# Patient Record
Sex: Female | Born: 1965 | Race: Black or African American | Hispanic: No | Marital: Single | Smoking: Former smoker
Health system: Southern US, Community
[De-identification: ages and names within clinical notes are randomized; demographics above are authoritative.]

## PROBLEM LIST (undated history)

## (undated) DIAGNOSIS — M3130 Wegener's granulomatosis without renal involvement: Secondary | ICD-10-CM

## (undated) DIAGNOSIS — I10 Essential (primary) hypertension: Secondary | ICD-10-CM

## (undated) DIAGNOSIS — R51 Headache: Secondary | ICD-10-CM

## (undated) DIAGNOSIS — M2550 Pain in unspecified joint: Secondary | ICD-10-CM

## (undated) DIAGNOSIS — R040 Epistaxis: Secondary | ICD-10-CM

## (undated) DIAGNOSIS — M79673 Pain in unspecified foot: Secondary | ICD-10-CM

## (undated) DIAGNOSIS — R21 Rash and other nonspecific skin eruption: Secondary | ICD-10-CM

## (undated) DIAGNOSIS — G8929 Other chronic pain: Secondary | ICD-10-CM

## (undated) DIAGNOSIS — G587 Mononeuritis multiplex: Secondary | ICD-10-CM

## (undated) DIAGNOSIS — R519 Headache, unspecified: Secondary | ICD-10-CM

## (undated) DIAGNOSIS — F419 Anxiety disorder, unspecified: Secondary | ICD-10-CM

## (undated) DIAGNOSIS — E039 Hypothyroidism, unspecified: Secondary | ICD-10-CM

## (undated) DIAGNOSIS — K559 Vascular disorder of intestine, unspecified: Secondary | ICD-10-CM

## (undated) DIAGNOSIS — R109 Unspecified abdominal pain: Secondary | ICD-10-CM

## (undated) DIAGNOSIS — G609 Hereditary and idiopathic neuropathy, unspecified: Secondary | ICD-10-CM

## (undated) DIAGNOSIS — IMO0002 Reserved for concepts with insufficient information to code with codable children: Secondary | ICD-10-CM

## (undated) HISTORY — DX: Vascular disorder of intestine, unspecified: K55.9

## (undated) HISTORY — DX: Epistaxis: R04.0

## (undated) HISTORY — DX: Rash and other nonspecific skin eruption: R21

## (undated) HISTORY — DX: Essential (primary) hypertension: I10

## (undated) HISTORY — DX: Hereditary and idiopathic neuropathy, unspecified: G60.9

## (undated) HISTORY — DX: Pain in unspecified joint: M25.50

## (undated) HISTORY — DX: Wegener's granulomatosis without renal involvement: M31.30

## (undated) HISTORY — DX: Anxiety disorder, unspecified: F41.9

## (undated) HISTORY — DX: Reserved for concepts with insufficient information to code with codable children: IMO0002

## (undated) HISTORY — DX: Hypothyroidism, unspecified: E03.9

## (undated) HISTORY — DX: Mononeuritis multiplex: G58.7

---

## 1990-02-25 HISTORY — PX: TUBAL LIGATION: SHX77

## 1999-11-15 ENCOUNTER — Emergency Department (HOSPITAL_COMMUNITY): Admission: EM | Admit: 1999-11-15 | Discharge: 1999-11-15 | Payer: Self-pay | Admitting: Emergency Medicine

## 2000-09-30 ENCOUNTER — Other Ambulatory Visit: Admission: RE | Admit: 2000-09-30 | Discharge: 2000-09-30 | Payer: Self-pay | Admitting: *Deleted

## 2001-04-02 ENCOUNTER — Encounter: Payer: Self-pay | Admitting: Emergency Medicine

## 2001-04-02 ENCOUNTER — Emergency Department (HOSPITAL_COMMUNITY): Admission: EM | Admit: 2001-04-02 | Discharge: 2001-04-02 | Payer: Self-pay | Admitting: Emergency Medicine

## 2002-06-14 ENCOUNTER — Emergency Department (HOSPITAL_COMMUNITY): Admission: EM | Admit: 2002-06-14 | Discharge: 2002-06-15 | Payer: Self-pay

## 2002-10-07 ENCOUNTER — Other Ambulatory Visit: Admission: RE | Admit: 2002-10-07 | Discharge: 2002-10-07 | Payer: Self-pay | Admitting: Gynecology

## 2002-12-11 ENCOUNTER — Encounter: Payer: Self-pay | Admitting: Emergency Medicine

## 2002-12-11 ENCOUNTER — Emergency Department (HOSPITAL_COMMUNITY): Admission: EM | Admit: 2002-12-11 | Discharge: 2002-12-11 | Payer: Self-pay | Admitting: Emergency Medicine

## 2003-04-16 ENCOUNTER — Emergency Department (HOSPITAL_COMMUNITY): Admission: EM | Admit: 2003-04-16 | Discharge: 2003-04-16 | Payer: Self-pay | Admitting: Emergency Medicine

## 2003-07-08 ENCOUNTER — Emergency Department (HOSPITAL_COMMUNITY): Admission: EM | Admit: 2003-07-08 | Discharge: 2003-07-09 | Payer: Self-pay | Admitting: Emergency Medicine

## 2003-10-29 ENCOUNTER — Emergency Department (HOSPITAL_COMMUNITY): Admission: EM | Admit: 2003-10-29 | Discharge: 2003-10-29 | Payer: Self-pay | Admitting: Emergency Medicine

## 2003-11-04 ENCOUNTER — Emergency Department (HOSPITAL_COMMUNITY): Admission: EM | Admit: 2003-11-04 | Discharge: 2003-11-04 | Payer: Self-pay | Admitting: Emergency Medicine

## 2003-11-09 ENCOUNTER — Emergency Department (HOSPITAL_COMMUNITY): Admission: EM | Admit: 2003-11-09 | Discharge: 2003-11-09 | Payer: Self-pay | Admitting: Emergency Medicine

## 2003-11-19 ENCOUNTER — Emergency Department (HOSPITAL_COMMUNITY): Admission: EM | Admit: 2003-11-19 | Discharge: 2003-11-19 | Payer: Self-pay | Admitting: Emergency Medicine

## 2003-11-21 ENCOUNTER — Encounter: Admission: RE | Admit: 2003-11-21 | Discharge: 2003-11-21 | Payer: Self-pay | Admitting: Otolaryngology

## 2003-11-22 ENCOUNTER — Encounter (INDEPENDENT_AMBULATORY_CARE_PROVIDER_SITE_OTHER): Payer: Self-pay | Admitting: Specialist

## 2003-11-22 ENCOUNTER — Ambulatory Visit (HOSPITAL_COMMUNITY): Admission: RE | Admit: 2003-11-22 | Discharge: 2003-11-22 | Payer: Self-pay | Admitting: Otolaryngology

## 2003-11-22 ENCOUNTER — Ambulatory Visit (HOSPITAL_BASED_OUTPATIENT_CLINIC_OR_DEPARTMENT_OTHER): Admission: RE | Admit: 2003-11-22 | Discharge: 2003-11-22 | Payer: Self-pay | Admitting: Otolaryngology

## 2003-12-02 ENCOUNTER — Emergency Department (HOSPITAL_COMMUNITY): Admission: EM | Admit: 2003-12-02 | Discharge: 2003-12-02 | Payer: Self-pay

## 2003-12-09 ENCOUNTER — Emergency Department (HOSPITAL_COMMUNITY): Admission: EM | Admit: 2003-12-09 | Discharge: 2003-12-09 | Payer: Self-pay | Admitting: Emergency Medicine

## 2004-02-24 ENCOUNTER — Emergency Department (HOSPITAL_COMMUNITY): Admission: EM | Admit: 2004-02-24 | Discharge: 2004-02-24 | Payer: Self-pay | Admitting: Emergency Medicine

## 2004-02-27 ENCOUNTER — Emergency Department (HOSPITAL_COMMUNITY): Admission: EM | Admit: 2004-02-27 | Discharge: 2004-02-27 | Payer: Self-pay | Admitting: Emergency Medicine

## 2004-05-12 ENCOUNTER — Emergency Department (HOSPITAL_COMMUNITY): Admission: EM | Admit: 2004-05-12 | Discharge: 2004-05-13 | Payer: Self-pay | Admitting: Emergency Medicine

## 2004-10-13 ENCOUNTER — Emergency Department (HOSPITAL_COMMUNITY): Admission: EM | Admit: 2004-10-13 | Discharge: 2004-10-13 | Payer: Self-pay

## 2004-12-26 ENCOUNTER — Emergency Department (HOSPITAL_COMMUNITY): Admission: EM | Admit: 2004-12-26 | Discharge: 2004-12-26 | Payer: Self-pay | Admitting: Emergency Medicine

## 2005-02-28 ENCOUNTER — Other Ambulatory Visit: Admission: RE | Admit: 2005-02-28 | Discharge: 2005-02-28 | Payer: Self-pay | Admitting: Gynecology

## 2009-12-17 ENCOUNTER — Emergency Department (HOSPITAL_COMMUNITY): Admission: EM | Admit: 2009-12-17 | Discharge: 2009-12-17 | Payer: Self-pay | Admitting: Emergency Medicine

## 2010-02-01 ENCOUNTER — Emergency Department (HOSPITAL_COMMUNITY): Admission: EM | Admit: 2010-02-01 | Discharge: 2010-01-18 | Payer: Self-pay | Admitting: Emergency Medicine

## 2010-02-06 ENCOUNTER — Emergency Department (HOSPITAL_COMMUNITY)
Admission: EM | Admit: 2010-02-06 | Discharge: 2010-02-06 | Payer: Self-pay | Source: Home / Self Care | Admitting: Emergency Medicine

## 2010-02-07 ENCOUNTER — Inpatient Hospital Stay (HOSPITAL_COMMUNITY)
Admission: EM | Admit: 2010-02-07 | Discharge: 2010-02-12 | Payer: Self-pay | Source: Home / Self Care | Attending: Internal Medicine | Admitting: Internal Medicine

## 2010-02-08 ENCOUNTER — Encounter (INDEPENDENT_AMBULATORY_CARE_PROVIDER_SITE_OTHER): Payer: Self-pay | Admitting: Internal Medicine

## 2010-02-27 ENCOUNTER — Ambulatory Visit: Payer: Self-pay | Admitting: Hematology and Oncology

## 2010-03-01 ENCOUNTER — Encounter
Admission: RE | Admit: 2010-03-01 | Discharge: 2010-03-01 | Payer: Self-pay | Source: Home / Self Care | Attending: Gastroenterology | Admitting: Gastroenterology

## 2010-03-07 LAB — CBC & DIFF AND RETIC
BASO%: 0.1 % (ref 0.0–2.0)
Basophils Absolute: 0 10*3/uL (ref 0.0–0.1)
EOS%: 0.2 % (ref 0.0–7.0)
Eosinophils Absolute: 0 10*3/uL (ref 0.0–0.5)
HCT: 34.3 % — ABNORMAL LOW (ref 34.8–46.6)
HGB: 11.2 g/dL — ABNORMAL LOW (ref 11.6–15.9)
Immature Retic Fract: 17.2 % — ABNORMAL HIGH (ref 0.00–10.70)
LYMPH%: 10 % — ABNORMAL LOW (ref 14.0–49.7)
MCH: 25.2 pg (ref 25.1–34.0)
MCHC: 32.7 g/dL (ref 31.5–36.0)
MCV: 77.1 fL — ABNORMAL LOW (ref 79.5–101.0)
MONO#: 0.1 10*3/uL (ref 0.1–0.9)
MONO%: 1.4 % (ref 0.0–14.0)
NEUT#: 8.3 10*3/uL — ABNORMAL HIGH (ref 1.5–6.5)
NEUT%: 88.3 % — ABNORMAL HIGH (ref 38.4–76.8)
Platelets: 403 10*3/uL — ABNORMAL HIGH (ref 145–400)
RBC: 4.45 10*6/uL (ref 3.70–5.45)
RDW: 16.3 % — ABNORMAL HIGH (ref 11.2–14.5)
Retic %: 0.73 % (ref 0.50–1.50)
Retic Ct Abs: 32.49 10*3/uL (ref 18.30–72.70)
WBC: 9.4 10*3/uL (ref 3.9–10.3)
lymph#: 0.9 10*3/uL (ref 0.9–3.3)

## 2010-03-07 LAB — URINALYSIS, MICROSCOPIC - CHCC
Bilirubin (Urine): NEGATIVE
Glucose: NEGATIVE g/dL
Ketones: NEGATIVE mg/dL
Nitrite: NEGATIVE
Protein: 100 mg/dL
Specific Gravity, Urine: 1.025 (ref 1.003–1.035)
pH: 6.5 (ref 4.6–8.0)

## 2010-03-07 LAB — MORPHOLOGY: PLT EST: INCREASED

## 2010-03-12 LAB — COMPREHENSIVE METABOLIC PANEL
ALT: 12 U/L (ref 0–35)
AST: 17 U/L (ref 0–37)
Albumin: 4.5 g/dL (ref 3.5–5.2)
Alkaline Phosphatase: 70 U/L (ref 39–117)
BUN: 8 mg/dL (ref 6–23)
CO2: 20 mEq/L (ref 19–32)
Calcium: 10.5 mg/dL (ref 8.4–10.5)
Chloride: 102 mEq/L (ref 96–112)
Creatinine, Ser: 0.67 mg/dL (ref 0.40–1.20)
Glucose, Bld: 126 mg/dL — ABNORMAL HIGH (ref 70–99)
Potassium: 4.5 mEq/L (ref 3.5–5.3)
Sodium: 137 mEq/L (ref 135–145)
Total Bilirubin: 0.3 mg/dL (ref 0.3–1.2)
Total Protein: 7.7 g/dL (ref 6.0–8.3)

## 2010-03-12 LAB — HYPERCOAGULABLE PANEL, COMPREHENSIVE
AntiThromb III Func: 117 % (ref 76–126)
Anticardiolipin IgA: 1 APL U/mL (ref <22)
Anticardiolipin IgG: 3 GPL U/mL (ref <23)
Anticardiolipin IgM: 2 MPL U/mL (ref <11)
Beta-2 Glyco I IgG: 0 G Units (ref <20)
Beta-2-Glycoprotein I IgA: 1 A Units (ref <20)
Beta-2-Glycoprotein I IgM: 7 M Units (ref <20)
DRVVT: 38.7 secs (ref 36.2–44.3)
Lupus Anticoagulant: NOT DETECTED
PTT Lupus Anticoagulant: 34.2 secs (ref 30.0–45.6)
Protein C Activity: 194 % — ABNORMAL HIGH (ref 75–133)
Protein C, Total: >150 % — ABNORMAL HIGH (ref 70–140)
Protein S Activity: 83 % (ref 69–129)
Protein S Ag, Total: 128 % (ref 70–140)

## 2010-03-12 LAB — HEMOGLOBINOPATHY EVALUATION
Hemoglobin Other: 0 % (ref 0.0–0.0)
Hgb A2 Quant: 2.4 % (ref 2.2–3.2)
Hgb A: 97.6 % (ref 96.8–97.8)
Hgb F Quant: 0 % (ref 0.0–2.0)
Hgb S Quant: 0 % (ref 0.0–0.0)

## 2010-03-12 LAB — IRON AND TIBC
%SAT: 8 % — ABNORMAL LOW (ref 20–55)
Iron: 25 ug/dL — ABNORMAL LOW (ref 42–145)
TIBC: 317 ug/dL (ref 250–470)
UIBC: 292 ug/dL

## 2010-03-12 LAB — LACTATE DEHYDROGENASE: LDH: 192 U/L (ref 94–250)

## 2010-03-12 LAB — FERRITIN: Ferritin: 50 ng/mL (ref 10–291)

## 2010-03-12 LAB — VITAMIN B12: Vitamin B-12: 557 pg/mL (ref 211–911)

## 2010-03-16 NOTE — H&P (Signed)
NAMELUCELY, [name] NO.:  [id number]  MEDICAL RECORD NO.:  [id number]          PATIENT TYPE:  EMS  LOCATION:  MINO                         FACILITY:  [facility]  PHYSICIAN:  Vania Rea, M.D. DATE OF BIRTH:  April 17, 1965  DATE OF ADMISSION:  02/07/2010 DATE OF DISCHARGE:                             HISTORY & PHYSICAL   PRIMARY CARE PHYSICIAN:  Unassigned.  Patient is being admitted to [facility].  CHIEF COMPLAINT:  Abdominal pain.  HISTORY OF PRESENT ILLNESS:  Laura [name] is a very pleasant 45 year old female with a history of hypertension and Wegener's syndrome who presents to the [facility] ED with a chief complaint of abdominal pain.  Information is obtained from the patient.  She states that she was treated approximately 4 weeks ago when she had the same pain for enteritis with p.o. Cipro and Flagyl.  She states that she did well while she was on the medication and she did complete both medications.  Approximately 2 days after completing, she says that the pain in her abdomen reoccurred.  She denies any vomiting.  She is nauseous and unable to eat.  She denies any diarrhea, constipation.  She states that she had a BM yesterday and is able to pass gas.  She states the pain is located in the upper quadrants, particularly on the right side.  She describes it as sharp and radiating to the right side and back.  She states that movement makes it worse as well as palpation.  She indicates that she came to the [facility] Emergency Department 2 days ago and was given Zantac and pain medications.  However, the pain has persisted.  She rates the pain a 7/10 at its worst and characterizes it as severe.  She denies any vomiting, diarrhea, constipation, melena, dysuria, or hematuria.  Workup in the ED yields a CT of her abdomen and pelvis with mucosal edema of the right colon consistent with colitis. We are asked to admit for further evaluation and  treatment.  ALLERGIES:  No known drug allergies.  PAST MEDICAL HISTORY: 1. Hypertension. 2. Wegener's syndrome. 3. Questionable remote Graves disease.  PAST SURGICAL HISTORY:  Tubal ligation.  FAMILY MEDICAL HISTORY:  Mother is alive.  She is 62.  She has diabetes, hypertension and breast cancer.  Father is deceased at 52 from an aneurysm.  She has 2 siblings, one of whom has diabetes.  She has a grandmother who died from a stroke.  SOCIAL HISTORY:  She lives with her family.  She is employed as a Pharmacologist.  She is new to the area.  She just moved here from [place].  She denies tobacco use.  Denies EtOH.  Denies drug use.  MEDICATIONS:  Lisinopril dose unknown, Folic acid dose unknown, prednisone dose unknown, methotrexate dose unknown, Ambien dose unknown, Xanax dose unknown.  Pharmacy to please reconcile medications.  REVIEW OF SYSTEMS:  GENERAL:  Negative for fever, chills, anorexia, unintentional weight loss.  ENT:  Negative for ear pain, nasal congestion, sore throat.  CV:  Negative for chest pain, palpitations, lower extremity edema.  RESPIRATORY:  Negative for increased work of breathing or  cough.  MUSCULOSKELETAL:  Negative for joint pain or muscle weakness.  NEUROLOGIC:  Negative for headache, visual disturbances, numbness, tingling of extremities.  GI:  See HPI.  GU:  Negative for dysuria, hematuria, frequency or urgency.  PSYCHIATRIC:  Positive for some anxiety.  HEME:  Negative for any unusual bruising or bleeding.  LABS:  WBC 9.3, hemoglobin 11.4, hematocrit 36.5, platelets 425.  Sodium 133, potassium 4.3, chloride 100, CO2 28, BUN 7, creatinine 0.74, glucose 101, albumin 2.8.  Urinalysis:  30 protein, small blood, rare bacteria, 0 to 2 RBCs, 3 to 6 WBCs.  A urine pregnancy test is negative.  RADIOLOGY:  Chest x-ray yields areas of scarring/chronic changes.  No active disease.  CT of abdomen/pelvis:  Mucosal edema of the right colon consistent with  colitis with primary consideration being inflammatory or infectious etiology.  No abscess.  The terminal ileum is unremarkable.  PHYSICAL EXAMINATION:  VITAL SIGNS:  Temperature 98.8, BP 131/86, HR 108, respirations 18, saturation 100% on room air. GENERAL:  Awake, alert, well-nourished, well-hydrated, in no acute distress. HEENT:  Head normocephalic, atraumatic.  Pupils equal, round, reactive to light.  EOMI.  Mucous membranes of her mouth are moist and pink.  No obvious lesion or exudate in her nose or ears. NECK:  Supple.  No JVD.  Full range of motion.  No lymphadenopathy. CV:  Tachycardic but regular.  No murmur, gallop or rub.  No lower extremity edema.  Pedal pulses present and palpable.  RESPIRATORY:  No increased work of breathing.  Breath sounds clear to auscultation bilaterally.  No rhonchi, wheezes or rales. ABDOMEN:  Round, soft, very sluggish bowel sounds, particularly in the upper quadrants and on the right, tender to palpation particularly on the right.  No mass or organomegaly noted. NEUROLOGIC:  Alert and oriented x3.  Speech clear.  Facial symmetry. MUSCULOSKELETAL:  Moves all extremities.  No joint swelling/erythema. Nontender to palpation. EXTREMITIES: Without clubbing or cyanosis.  ASSESSMENT/PLAN: 1. Abdominal pain.  CT consistent with colitis.  Will admit.  We will     start Zosyn as the patient has already failed Cipro and Flagyl as     an outpatient.  Will provide pain medication and antiemetic as     needed.  Will give clear liquids and request GI consult. 2. Tachycardia probably secondary to pain and/or dehydration.  Will     provide pain medication and gently hydrate with IV fluids and     monitor on telemetry for 24 hours.  Will get an EKG. 3. Hypertension.  Blood pressure is currently 131/86.  Will continue     her lisinopril once medication reconciliation form complete. 4. History of Wegener's disease.  She is on 3 medications.  Will     continue once  medication reconciliation form complete. 5. Deep venous thrombosis prophylaxis.  Will use Lovenox.  CODE STATUS:  The patient is a full code.  This assessment and plan was discussed with Dr. Orvan Falconer.  It was truly a pleasure taking care of Laura [name].     Gwenyth Bender, NP   ______________________________ Vania Rea, M.D.    KMB/MEDQ  D:  02/07/2010  T:  02/07/2010  Job:  [id number]  Electronically Signed by Vania Rea M.D. on 03/10/2010 01:55:18 PM Electronically Signed by Toya Smothers  on 03/16/2010 08:25:27 AM

## 2010-03-18 ENCOUNTER — Encounter: Payer: Self-pay | Admitting: Gastroenterology

## 2010-03-27 ENCOUNTER — Other Ambulatory Visit: Payer: Self-pay | Admitting: Nephrology

## 2010-03-27 DIAGNOSIS — R809 Proteinuria, unspecified: Secondary | ICD-10-CM

## 2010-03-28 ENCOUNTER — Encounter: Payer: Self-pay | Admitting: Gastroenterology

## 2010-04-03 ENCOUNTER — Encounter (HOSPITAL_COMMUNITY): Payer: Managed Care, Other (non HMO) | Attending: Rheumatology

## 2010-04-03 DIAGNOSIS — M3130 Wegener's granulomatosis without renal involvement: Secondary | ICD-10-CM | POA: Insufficient documentation

## 2010-04-04 ENCOUNTER — Ambulatory Visit
Admission: RE | Admit: 2010-04-04 | Discharge: 2010-04-04 | Disposition: A | Discharge status: Home / Self Care | Payer: Managed Care, Other (non HMO) | Source: Ambulatory Visit | Attending: Nephrology | Admitting: Nephrology

## 2010-04-04 DIAGNOSIS — R809 Proteinuria, unspecified: Secondary | ICD-10-CM

## 2010-04-10 ENCOUNTER — Encounter (HOSPITAL_COMMUNITY): Payer: Managed Care, Other (non HMO)

## 2010-04-17 ENCOUNTER — Encounter (HOSPITAL_COMMUNITY): Payer: Managed Care, Other (non HMO)

## 2010-04-24 ENCOUNTER — Encounter (HOSPITAL_COMMUNITY): Payer: Managed Care, Other (non HMO)

## 2010-05-07 LAB — URINE MICROSCOPIC-ADD ON

## 2010-05-07 LAB — CBC
HCT: 30.3 % — ABNORMAL LOW (ref 36.0–46.0)
Hemoglobin: 10.6 g/dL — ABNORMAL LOW (ref 12.0–15.0)
Hemoglobin: 9.5 g/dL — ABNORMAL LOW (ref 12.0–15.0)
Hemoglobin: 11.2 g/dL — ABNORMAL LOW (ref 12.0–15.0)
MCH: 24.5 pg — ABNORMAL LOW (ref 26.0–34.0)
MCHC: 31.2 g/dL (ref 30.0–36.0)
MCV: 79.2 fL (ref 78.0–100.0)
Platelets: 389 10*3/uL (ref 150–400)
Platelets: 425 10*3/uL — ABNORMAL HIGH (ref 150–400)
Platelets: 488 10*3/uL — ABNORMAL HIGH (ref 150–400)
RBC: 3.84 MIL/uL — ABNORMAL LOW (ref 3.87–5.11)
RBC: 4.26 MIL/uL (ref 3.87–5.11)
RBC: 4.51 MIL/uL (ref 3.87–5.11)
RDW: 14.9 % (ref 11.5–15.5)
WBC: 6.1 10*3/uL (ref 4.0–10.5)
WBC: 6.9 10*3/uL (ref 4.0–10.5)
WBC: 9.5 10*3/uL (ref 4.0–10.5)

## 2010-05-07 LAB — DIFFERENTIAL
Basophils Absolute: 0 10*3/uL (ref 0.0–0.1)
Basophils Relative: 0 % (ref 0–1)
Eosinophils Relative: 8 % — ABNORMAL HIGH (ref 0–5)
Eosinophils Relative: 8 % — ABNORMAL HIGH (ref 0–5)
Lymphocytes Relative: 15 % (ref 12–46)
Lymphs Abs: 1.4 10*3/uL (ref 0.7–4.0)
Monocytes Absolute: 0.3 10*3/uL (ref 0.1–1.0)
Monocytes Absolute: 0.5 10*3/uL (ref 0.1–1.0)
Neutro Abs: 6.9 10*3/uL (ref 1.7–7.7)

## 2010-05-07 LAB — BASIC METABOLIC PANEL
BUN: 2 mg/dL — ABNORMAL LOW (ref 6–23)
Calcium: 8.6 mg/dL (ref 8.4–10.5)
GFR calc Af Amer: >60 mL/min (ref >60)
GFR calc Af Amer: >60 mL/min (ref >60)
GFR calc non Af Amer: >60 mL/min (ref >60)
GFR calc non Af Amer: >60 mL/min (ref >60)
Potassium: 3.5 mEq/L (ref 3.5–5.1)
Potassium: 3.6 mEq/L (ref 3.5–5.1)
Sodium: 137 mEq/L (ref 135–145)
Sodium: 140 mEq/L (ref 135–145)

## 2010-05-07 LAB — C1 ESTERASE INHIBITOR: C1INH SerPl-mCnc: 17 mg/dL (ref 11–26)

## 2010-05-07 LAB — COMPREHENSIVE METABOLIC PANEL
AST: 20 U/L (ref 0–37)
AST: 27 U/L (ref 0–37)
Albumin: 2.8 g/dL — ABNORMAL LOW (ref 3.5–5.2)
Albumin: 3.2 g/dL — ABNORMAL LOW (ref 3.5–5.2)
Alkaline Phosphatase: 59 U/L (ref 39–117)
BUN: 10 mg/dL (ref 6–23)
Calcium: 8.7 mg/dL (ref 8.4–10.5)
Chloride: 103 mEq/L (ref 96–112)
Creatinine, Ser: 0.74 mg/dL (ref 0.4–1.2)
GFR calc Af Amer: >60 mL/min (ref >60)
GFR calc non Af Amer: >60 mL/min (ref >60)
Potassium: 4 mEq/L (ref 3.5–5.1)
Total Bilirubin: 0.3 mg/dL (ref 0.3–1.2)

## 2010-05-07 LAB — URINALYSIS, ROUTINE W REFLEX MICROSCOPIC
Glucose, UA: NEGATIVE mg/dL
Ketones, ur: NEGATIVE mg/dL
Leukocytes, UA: NEGATIVE
pH: 6.5 (ref 5.0–8.0)

## 2010-05-07 LAB — LACTIC ACID, PLASMA: Lactic Acid, Venous: 0.9 mmol/L (ref 0.5–2.2)

## 2010-05-08 LAB — URINALYSIS, ROUTINE W REFLEX MICROSCOPIC
Bilirubin Urine: NEGATIVE
Glucose, UA: NEGATIVE mg/dL
Hgb urine dipstick: NEGATIVE
Ketones, ur: NEGATIVE mg/dL
Nitrite: NEGATIVE
pH: 7 (ref 5.0–8.0)

## 2010-05-08 LAB — COMPREHENSIVE METABOLIC PANEL
AST: 19 U/L (ref 0–37)
Albumin: 3.2 g/dL — ABNORMAL LOW (ref 3.5–5.2)
Alkaline Phosphatase: 59 U/L (ref 39–117)
Chloride: 101 mEq/L (ref 96–112)
GFR calc Af Amer: >60 mL/min (ref >60)
Potassium: 4.1 mEq/L (ref 3.5–5.1)
Total Bilirubin: 0.3 mg/dL (ref 0.3–1.2)
Total Protein: 6.5 g/dL (ref 6.0–8.3)

## 2010-05-08 LAB — DIFFERENTIAL
Basophils Absolute: 0 10*3/uL (ref 0.0–0.1)
Basophils Relative: 0 % (ref 0–1)
Eosinophils Relative: 7 % — ABNORMAL HIGH (ref 0–5)
Lymphocytes Relative: 24 % (ref 12–46)
Monocytes Absolute: 0.9 10*3/uL (ref 0.1–1.0)
Monocytes Relative: 9 % (ref 3–12)

## 2010-05-08 LAB — POCT PREGNANCY, URINE: Preg Test, Ur: NEGATIVE

## 2010-05-08 LAB — CBC
Platelets: 338 10*3/uL (ref 150–400)
RBC: 4.15 MIL/uL (ref 3.87–5.11)
RDW: 15.1 % (ref 11.5–15.5)
WBC: 10.5 10*3/uL (ref 4.0–10.5)

## 2010-05-08 LAB — URINE MICROSCOPIC-ADD ON

## 2010-07-13 NOTE — Op Note (Signed)
NAMENICKISHA, HUM             ACCOUNT NO.:  [id number]   MEDICAL RECORD NO.:  [id number]          PATIENT TYPE:  AMB   LOCATION:  DSC                          FACILITY:  [facility]   PHYSICIAN:  Karol T. Lazarus Salines, M.D. DATE OF BIRTH:  [birth year]   DATE OF PROCEDURE:  11/22/2003  DATE OF DISCHARGE:                                 OPERATIVE REPORT   PREOPERATIVE DIAGNOSIS:  Inflammatory process bilateral middle ear, ear  canal, nasal septum, left inferior turbinate.   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   OPERATION PERFORMED:  1.  Bilateral myringotomy with tube placement.  2.  Myringocentesis for culture, right ear.  3.  Biopsy middle ear, left.  4.  Biopsy bilateral ear canal granulation tissue.  5.  Nasal endoscopy with biopsy of the nasal septum.  6.  Biopsy of the left inferior turbinate.   SURGEON:  Gloris Manchester. Lazarus Salines, M.D.   ANESTHESIA:  General orotracheal anesthesia.   ESTIMATED BLOOD LOSS:  Minimal.   COMPLICATIONS:  None.   OPERATIVE FINDINGS:  Freely bleeding granulation tissue in the anterior  sulcus of the ear canal on both sides with apparent extension through the  tympanic membrane on the right side.  Pus and abundant bleeding, pale  granulation tissue in the middle ear bilaterally.  Shaggy nonbleeding mucosa  of the posterior superior nasal septum against the perpendicular plate of  the ethmoid and necrotic and bleeding anterior tip of the left inferior  turbinate.   DESCRIPTION OF PROCEDURE:  With the patient in a comfortable supine  position, general orotracheal anesthesia was induced without difficulty.  At  an appropriate level, microscope and speculum were used to examine and clean  the left ear canal.  The findings were as described above. The ear canal was  suctioned clean of some mucosuppurative drainage.   Betadine solution was  used to fill the ear canal, was left in place for several minutes.  This was  suctioned free and rinsed with saline.  Using a 22 gauge spinal needle, the  anterior inferior quadrant of the tympanic membrane was penetrated and a  tiny amount of fluid was aspirated from the middle ear and sent for culture  for aerobic bacteria.  A radial myringotomy incision was sharply executed.  There was abundant pale granulation tissue.  Several biopsies were taken off  the middle ear mucosa through the myringotomy.  Following this, a Sheehy  grommet tube was placed and noted to be well retained.  With the [facility] tube  in position, multiple cup biopsies of the granulation tissue in the anterior  sulcus were obtained and sent for permanent interpretation separate from the  middle ear biopsies.  Blood was suctioned free of the canal, Ciprodex drops  were instilled and insufflated and a cotton ball was placed at the external  meatus.  This side was completed.   Attention was turned to the right ear where it was once again cleaned.  An  anterior inferior quadrant radial myringotomy incision was sharply  executed  and the [facility] grommet was placed.  There was an apparent perforation  through the tympanic membrane communicating with the granulation tissue in  the anterior sulcus.  Several biopsies were taken from this granulation  tissue and sent for permanent interpretation.  Ciprodex drops were instilled  into the external canal and insufflated into the middle ear.  A cotton ball  was placed at the external meatus and this side was completed.   The patient had used Afrin spray preoperatively but the nose was still quite  congested.  A saline moistened throat pack was placed.  1% Xylocaine with  1:100,000 epinephrine was infiltrated into the anterior pole of the left  inferior turbinate and into the submucoperichondrial plane of the septum on  both sides, 6 mL total.  Several minutes  were allowed for intraoperative  hemostasis to take place.   The 0 degree nasal endoscope was defogged and passed low in the nose on the  right side to inspect the nasopharynx once again.  The eustachian torus and  nasopharyngeal mucosa looked healthy.  Inspection of the nose revealed a  shaggy nonbleeding mucosa of the posterior superior nasal septum.  Several  biopsies were taken including pieces of the perpendicular plate of the  ethmoid using a Wild punch forceps.  These were sent for permanent  interpretation.  There was no significant bleeding.   Attention was turned to the left inferior turbinate.  The anterior hood was  sharply lysed and the turbinate was infractured.  Using the Gruenwald punch  forceps, multiple large punch biopsies of the anterior one third of the  inferior turbinate was taken.  This portion of the turbinate was essentially  completely removed.  A frozen section from the anterior sulcus on the left  side returned only necrotic tissue and it was nondiagnostic.  Part of the  turbinate was sent for frozen section as well to make a prompt medical  diagnosis.  The cut mucosal edges of the turbinate were suction coagulated.  There were no other suspicious lesions in the left side of the nose.  Hemostasis was observed after several minutes in both sides of the nose.  The pharynx was suctioned free and the throat pack was removed.  The patient  was returned to anesthesia, awakened, extubated, and transferred to recovery  in stable condition.   COMMENT:  A 45 year old black female with a several week history of  decreased hearing, severe pain, nasal congestion and overall malaise was the  indication for today's procedure.  A suspicion of Wegener's granulomatosis  or such unusual entities as eosinophilic granuloma, or lethal midline  reticulosis were considered.  Possible invasive fungal infection is on the list although she is not immune compromised in any substantial  way.  For now  we will await permanent pathologic interpretation and the remainder of the  serologies.  Of note is that her rheumatoid factor was negative, white blood  cell count was elevated at 13,000.  Sed rate was elevated at 104.  C-ANKA  and ANA are pending.  Urinalysis clear.  Chest x-ray did show substantial  hilar and pulmonary lesions consistent with but not  diagnostic of Wegener's disease.  Given low anticipated risks of post  anesthetic or post surgical complications,  I feel an outpatient venue is  appropriate.  I will contact her primary internist, Renaye Rakers, M.D. today  to try to get her under relatively prompt assistance for what appears to be  a fairly active process.  Karo   KTW/MEDQ  D:  11/22/2003  T:  11/22/2003  Job:  [id number]   cc:   Renaye Rakers, M.D.  [phone], [address]  [place]  [place] [address]  Fax: [phone]   Dr. Anselmo Pickler

## 2010-08-10 ENCOUNTER — Encounter: Payer: Managed Care, Other (non HMO) | Attending: Physical Medicine & Rehabilitation

## 2010-08-10 ENCOUNTER — Ambulatory Visit: Payer: Managed Care, Other (non HMO) | Admitting: Physical Medicine & Rehabilitation

## 2010-08-10 DIAGNOSIS — M79609 Pain in unspecified limb: Secondary | ICD-10-CM | POA: Insufficient documentation

## 2010-08-10 DIAGNOSIS — R61 Generalized hyperhidrosis: Secondary | ICD-10-CM | POA: Insufficient documentation

## 2010-08-10 DIAGNOSIS — R209 Unspecified disturbances of skin sensation: Secondary | ICD-10-CM

## 2010-08-10 DIAGNOSIS — R21 Rash and other nonspecific skin eruption: Secondary | ICD-10-CM | POA: Insufficient documentation

## 2010-08-10 DIAGNOSIS — I10 Essential (primary) hypertension: Secondary | ICD-10-CM | POA: Insufficient documentation

## 2010-08-10 DIAGNOSIS — Z79899 Other long term (current) drug therapy: Secondary | ICD-10-CM | POA: Insufficient documentation

## 2010-08-13 NOTE — Consult Note (Signed)
HISTORY:  A 45 year old female who gives a history of Wegener granulomatosis, diagnosed in 2005.  She states she has skin rash associated with this, and also states that she has had chronic severe foot pain associated with this.  She indicates that she has had an EMG, but this was reportedly normal.  She has been trialed on Lyrica and Neurontin, which have not been helpful and more recently over the last year, has been placed on OxyContin and oxycodone.  Her OxyContin, she takes 40 mg twice a day and oxycodone she takes 5 mg 3 tabs a day. Looking at her medications bottles, she is taking the oxycodone on a regular basis, but the OxyContin prescription is from June 19, 2010, and still has 34 tablets left.  She denies any facial weakness.  She denies any lower extremity weakness.  She has no headaches.  She has no hearing deficit.  She denies any swallowing problems.  No breathing difficulties.  CURRENT MEDICATIONS:  Lisinopril, Ambien, Imuran, prednisone, oxycodone, and OxyContin.  Average pain is 7/10, described as burning and tingling which is constant.  Pain is worse with walking, bending, sitting, standing; improves with medication.  Relief from meds is fair to good.  She climbs steps.  She drives.  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  REVIEW OF SYSTEMS:  Positive for night sweats, skin rash, abdominal pain, poor appetite.  ALLERGIES:  None known.  PAST MEDICAL HISTORY:  Thyroid problems as well as hypertension, is on medication for hypertension.  SOCIAL HISTORY:  Single, nonsmoker, nondrinker.Marland Kitchen  FAMILY HISTORY:  Diabetes, hypertension, and disability.  PHYSICAL EXAMINATION:  VITAL SIGNS:  Blood pressure 158/101, pulse 89, respirations 18, O2 sat 99% on room air. GENERAL:  This is anxious-appearing female in no acute distress.  She has moon face and buffalo hump.  Orientation x3.  Gait is normal. MUSCULOSKELETAL:  She has no tenderness to  palpation on the cervical, thoracic, or lumbar spine.  Her neck range of motion is full.  She has no evidence of joint deformity in her hands or her feet or in elbows and knees.  She has full range of motion, both upper and lower extremities. Negative straight leg raising test.  She has normal strength in bilateral upper and lower extremities.  No evidence of intrinsic atrophy.  She has normal pulses.  She does have a mild erythematous papular rash on the inner part of her right forearm.  Her sensory exam is normal to light touch and pinprick, this is in both the upper and lower extremities.  IMPRESSION:  History of burning pain in the feet.  She has a history of Wegener granulomatosis which has been associated with peripheral neuropathy in around 40% of patients.  However, her EMG findings were reportedly negative.  Typically, this would be an axonal neuropathy which should show up on EMG.  RECOMMENDATIONS: 1. We will repeat EMG, if positive findings. 2. Urine drug screen. 3. If EMG negative, I would not feel comfortable in prescribing     narcotic analgesics with lack of objective evidence of neuropathy.     I did prescribe her some nortriptyline for chronic pain.  She can     take this at night to help her sleep.  She does have some wakening     early morning hours related to her pain. 4. Followup Rheumatology for Imuran and prednisone. 5. Followup primary care for lisinopril.  Discussed with patient, agrees with plan.  Erick Colace, M.D. Electronically Signed    AEK/MedQ D:08/10/2010 13:12:51  T:08/11/2010 01:03:33  Job #:  [id number]  cc:   Elby Showers, MD Fax: [phone]  Zenovia Jordan, MD Fax: [phone]

## 2010-08-22 ENCOUNTER — Other Ambulatory Visit: Payer: Self-pay | Admitting: Gastroenterology

## 2010-09-03 ENCOUNTER — Encounter: Payer: Managed Care, Other (non HMO) | Attending: Physical Medicine & Rehabilitation

## 2010-09-03 ENCOUNTER — Ambulatory Visit: Payer: Managed Care, Other (non HMO) | Admitting: Physical Medicine & Rehabilitation

## 2010-09-03 DIAGNOSIS — I10 Essential (primary) hypertension: Secondary | ICD-10-CM | POA: Insufficient documentation

## 2010-09-03 DIAGNOSIS — R21 Rash and other nonspecific skin eruption: Secondary | ICD-10-CM | POA: Insufficient documentation

## 2010-09-03 DIAGNOSIS — R61 Generalized hyperhidrosis: Secondary | ICD-10-CM | POA: Insufficient documentation

## 2010-09-03 DIAGNOSIS — Z79899 Other long term (current) drug therapy: Secondary | ICD-10-CM | POA: Insufficient documentation

## 2010-09-03 DIAGNOSIS — M79609 Pain in unspecified limb: Secondary | ICD-10-CM | POA: Insufficient documentation

## 2010-09-03 DIAGNOSIS — R209 Unspecified disturbances of skin sensation: Secondary | ICD-10-CM | POA: Insufficient documentation

## 2010-09-09 ENCOUNTER — Emergency Department (HOSPITAL_COMMUNITY)
Admission: EM | Admit: 2010-09-09 | Discharge: 2010-09-09 | Disposition: A | Discharge status: Home / Self Care | Payer: Managed Care, Other (non HMO) | Attending: Emergency Medicine | Admitting: Emergency Medicine

## 2010-09-09 DIAGNOSIS — S058X9A Other injuries of unspecified eye and orbit, initial encounter: Secondary | ICD-10-CM | POA: Insufficient documentation

## 2010-09-09 DIAGNOSIS — M3390 Dermatopolymyositis, unspecified, organ involvement unspecified: Secondary | ICD-10-CM | POA: Insufficient documentation

## 2010-09-09 DIAGNOSIS — I10 Essential (primary) hypertension: Secondary | ICD-10-CM | POA: Insufficient documentation

## 2010-09-09 DIAGNOSIS — M3313 Other dermatomyositis without myopathy: Secondary | ICD-10-CM | POA: Insufficient documentation

## 2010-09-09 DIAGNOSIS — X58XXXA Exposure to other specified factors, initial encounter: Secondary | ICD-10-CM | POA: Insufficient documentation

## 2010-09-09 DIAGNOSIS — H109 Unspecified conjunctivitis: Secondary | ICD-10-CM | POA: Insufficient documentation

## 2010-10-19 ENCOUNTER — Ambulatory Visit: Payer: Managed Care, Other (non HMO) | Admitting: Physical Medicine & Rehabilitation

## 2010-10-19 ENCOUNTER — Encounter: Payer: Managed Care, Other (non HMO) | Attending: Physical Medicine & Rehabilitation

## 2010-10-19 DIAGNOSIS — G609 Hereditary and idiopathic neuropathy, unspecified: Secondary | ICD-10-CM

## 2010-10-19 DIAGNOSIS — Z79899 Other long term (current) drug therapy: Secondary | ICD-10-CM | POA: Insufficient documentation

## 2010-10-19 DIAGNOSIS — R209 Unspecified disturbances of skin sensation: Secondary | ICD-10-CM | POA: Insufficient documentation

## 2010-10-19 DIAGNOSIS — I10 Essential (primary) hypertension: Secondary | ICD-10-CM | POA: Insufficient documentation

## 2010-10-19 DIAGNOSIS — M79609 Pain in unspecified limb: Secondary | ICD-10-CM | POA: Insufficient documentation

## 2010-10-19 DIAGNOSIS — R21 Rash and other nonspecific skin eruption: Secondary | ICD-10-CM | POA: Insufficient documentation

## 2010-10-19 DIAGNOSIS — R61 Generalized hyperhidrosis: Secondary | ICD-10-CM | POA: Insufficient documentation

## 2010-11-16 ENCOUNTER — Encounter: Payer: Managed Care, Other (non HMO) | Attending: Physical Medicine & Rehabilitation

## 2010-11-16 ENCOUNTER — Ambulatory Visit: Payer: Managed Care, Other (non HMO) | Admitting: Physical Medicine & Rehabilitation

## 2010-11-16 DIAGNOSIS — Z79899 Other long term (current) drug therapy: Secondary | ICD-10-CM | POA: Insufficient documentation

## 2010-11-16 DIAGNOSIS — I10 Essential (primary) hypertension: Secondary | ICD-10-CM | POA: Insufficient documentation

## 2010-11-16 DIAGNOSIS — M79609 Pain in unspecified limb: Secondary | ICD-10-CM | POA: Insufficient documentation

## 2010-11-16 DIAGNOSIS — G587 Mononeuritis multiplex: Secondary | ICD-10-CM

## 2010-11-16 DIAGNOSIS — R21 Rash and other nonspecific skin eruption: Secondary | ICD-10-CM | POA: Insufficient documentation

## 2010-11-16 DIAGNOSIS — R61 Generalized hyperhidrosis: Secondary | ICD-10-CM | POA: Insufficient documentation

## 2010-11-16 DIAGNOSIS — R209 Unspecified disturbances of skin sensation: Secondary | ICD-10-CM | POA: Insufficient documentation

## 2010-11-20 NOTE — Assessment & Plan Note (Signed)
CHIEF COMPLAINT:  Foot pain.  A 45 year old female with history of Wegener granulomatosis diagnosed in 2005.  She has had skin rash associated with this and had chronic severe foot pain.  Prior EMG was reported as normal.  She was trialed on Neurontin and Lyrica which have not been helpful, was placed on OxyContin and oxycodone.  She has been using this with very good relief.  She has review of systems positive for sleep disorder.  Her meds include lisinopril, Ambien, Imuran, prednisone 10 mg, as well OxyContin 40 b.i.d., and oxycodone 5/325 t.i.d.  She states that the OxyContin works well for 2 hours, but then the ensuing 10 hours she has to take about three oxycodone just to keep her comfortable.  She has not tried any other pain medications other than those listed above.  She has tried some nortriptyline which was only partially effective at 10 mg dose at night.  Her pain score without meds is 8, with meds is 0.  PAST SURGICAL HISTORY:  Tubal ligation in 1992.  Other past history, hypertension and hypothyroidism.  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  FAMILY HISTORY:  Diabetes and hypertension.  PHYSICAL EXAMINATION:  VITAL SIGNS:  Blood pressure 145/89, pulse 82, respirations 16, O2 sat 97% on room air. GENERAL:  No acute distress.  Mood and affect appropriate. EXTREMITIES:  Her lower extremities have no swelling.  She has no joint deformities.  No skin rash noted. MUSCULOSKELETAL:  She has good pulses.  She has good range of motion. She has normal sensation to pinprick and light touch.  She has reduced deep tendon reflexes, bilateral ankles and normal at the knees.  Review of EMG, from October 19, 2010, performed by myself.  She had bilateral sural nerves which were absent on the left and prolonged on the right, the left tibial motor amplitude was low.  IMPRESSION: 1. 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     We will try her off the OxyContin switch to Opana ER 20 mg b.i.d.     and go without breakthrough medicine as this should have a longer     lasting formulation. 2. Continue Cymbalta, this is prescribed by other physician for nerve     pain.  May also help with her mood and affect.  I will see her back in 1 month if the Opana ER is not particularly effective, we would try either fentanyl patch or Nucynta ER which may have more of a nerve pain affect, however, this may potentially interact with Cymbalta.  Discussed with patient agrees with plan.     Erick Colace, M.D. Electronically Signed    AEK/MedQ D:  11/16/2010 14:22:52  T:  11/16/2010 21:02:02  Job #:  [id number]  cc:   Elby Showers, MD Fax: [phone]

## 2010-12-21 ENCOUNTER — Encounter: Payer: Managed Care, Other (non HMO) | Attending: Physical Medicine & Rehabilitation

## 2010-12-21 ENCOUNTER — Ambulatory Visit: Payer: Managed Care, Other (non HMO) | Admitting: Physical Medicine & Rehabilitation

## 2010-12-21 DIAGNOSIS — R209 Unspecified disturbances of skin sensation: Secondary | ICD-10-CM | POA: Insufficient documentation

## 2010-12-21 DIAGNOSIS — Z79899 Other long term (current) drug therapy: Secondary | ICD-10-CM | POA: Insufficient documentation

## 2010-12-21 DIAGNOSIS — I10 Essential (primary) hypertension: Secondary | ICD-10-CM | POA: Insufficient documentation

## 2010-12-21 DIAGNOSIS — M79609 Pain in unspecified limb: Secondary | ICD-10-CM | POA: Insufficient documentation

## 2010-12-21 DIAGNOSIS — R21 Rash and other nonspecific skin eruption: Secondary | ICD-10-CM | POA: Insufficient documentation

## 2010-12-21 DIAGNOSIS — R61 Generalized hyperhidrosis: Secondary | ICD-10-CM | POA: Insufficient documentation

## 2010-12-21 DIAGNOSIS — G587 Mononeuritis multiplex: Secondary | ICD-10-CM

## 2010-12-24 NOTE — Assessment & Plan Note (Signed)
REASON FOR VISIT:  Foot pain.  HISTORY:  A 45 year old female with history of Wegener granulomatosis diagnosed in 2005.  She has had a skin rash associated with it as well as chronic severe foot pain.  EMG was performed, showing absent sural nerve on the left, follow on the right, tibial nerve amplitude is slow. She was started on Opana ER 20 mg b.i.d., and previously had been tried on Lyrica and Neurontin which were not helpful.  She has been on Cymbalta which has been somewhat helpful.  The Opana ER helps her in the morning.  He takes about 5:00 a.m. but wears off around noon.  Then, she has had a few leftover Percocet that she took 1 at about noon and then she takes her second Opana ER at 5:00 p.m. and she states that the second pill does not work as well as the first in terms of relieving her pain, then wonders whether it is because she may not have empty stomach at that point.  She has had pain rated as 8/10 but currently 5, burning, stabbing, tingling in the feet bilaterally, it increases with activity. 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  She can climb steps. She can drive.  She has depression, night sweats, weight gain.  PAST MEDICAL HISTORY:  Hypothyroid as well as hypertension.  SOCIAL HISTORY:  Single, nonsmoker, nondrinker.  OBJECTIVE:  VITAL SIGNS:  Blood pressure 131/79, pulse 65, respirations 16, and O2 saturation 99% on room air. GENERAL:  No acute distress.  Mood and affect appropriate.  Her feet show intact pinprick, intact proprioception, normal strength.  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  PLAN:  Given suboptimal response to Opana ER, we will try on fentanyl patch 50 mcg.  I will give her 2-week supply, see her back in 2 weeks and go from there.  She will continue her Cymbalta.  If the above is not effective, may wean her off the Cymbalta and start her on Nucynta ER.     Erick Colace, M.D. Electronically Signed    AEK/MedQ D:  12/21/2010 13:16:00  T:  12/21/2010 14:28:54  Job #:  [id number]

## 2011-01-04 ENCOUNTER — Encounter: Payer: Managed Care, Other (non HMO) | Attending: Physical Medicine & Rehabilitation

## 2011-01-04 ENCOUNTER — Ambulatory Visit: Payer: Managed Care, Other (non HMO) | Admitting: Physical Medicine & Rehabilitation

## 2011-01-04 DIAGNOSIS — I10 Essential (primary) hypertension: Secondary | ICD-10-CM | POA: Insufficient documentation

## 2011-01-04 DIAGNOSIS — R61 Generalized hyperhidrosis: Secondary | ICD-10-CM | POA: Insufficient documentation

## 2011-01-04 DIAGNOSIS — G609 Hereditary and idiopathic neuropathy, unspecified: Secondary | ICD-10-CM

## 2011-01-04 DIAGNOSIS — Z79899 Other long term (current) drug therapy: Secondary | ICD-10-CM | POA: Insufficient documentation

## 2011-01-04 DIAGNOSIS — R209 Unspecified disturbances of skin sensation: Secondary | ICD-10-CM | POA: Insufficient documentation

## 2011-01-04 DIAGNOSIS — R21 Rash and other nonspecific skin eruption: Secondary | ICD-10-CM | POA: Insufficient documentation

## 2011-01-04 DIAGNOSIS — M79609 Pain in unspecified limb: Secondary | ICD-10-CM | POA: Insufficient documentation

## 2011-01-04 NOTE — Assessment & Plan Note (Signed)
This is a patient of Dr. Wynn Banker seen for peripheral neuropathy due to her [address] granulomatosis history.  She was started on fentanyl 50 mcg once transdermally every 72 hours by Dr. Wynn Banker.  She is continuing her Cymbalta.  She states she has good pain relief until the third day, I told her that it was fairly normal.  She asked about going up on the dose and we can do that after 2 weeks.  We would trial her for another 2- 3 weeks, and if she still is having increased pain on the third day or even sooner, he may switch her over.  Her average pain is 3-8.  It is a sharp, burning, stabbing, tingling, and aching pain.  General activity level is 4.  Pain is same 24 hours a day.  Sleep patterns are good. Pain is worse with all activity.  Medication does help.  She walks without assistance.  She climb steps and drives.  She works 40 hours a week.  REVIEW OF SYSTEMS:  Notable for difficulties described above as well as some high blood sugars, night sweats and depression.  No suicidal thoughts or aberrant behaviors.  PAST MEDICAL HISTORY:  Unchanged.  SOCIAL HISTORY:  Unchanged.  FAMILY HISTORY:  Unchanged.  PHYSICAL EXAMINATION:  Her blood pressure is 132/81, pulse 101, respirations 16, O2 sats 97 on room air. Motor strength and sensation is intact, but somewhat diminished in the lower extremities.  Constitutionally, she is within normal limits.  She is alert and oriented x3, somewhat depressed.  Her gait is normal.  IMPRESSION:  Lower extremities peripheral neuropathy.  PLAN:  Refill fentanyl 50 mcg one transdermally every 72 hours 10 with no refill.  She will follow up with Dr. Wynn Banker in the next 2-3 weeks. Her questions were encouraged and answered.     Jacquette Canales L. Blima Dessert Electronically Signed    RLW/MedQ D:  01/04/2011 12:40:12  T:  01/04/2011 13:06:48  Job #:  [id number]

## 2011-01-25 ENCOUNTER — Ambulatory Visit: Payer: Managed Care, Other (non HMO) | Admitting: Physical Medicine & Rehabilitation

## 2011-01-25 DIAGNOSIS — G609 Hereditary and idiopathic neuropathy, unspecified: Secondary | ICD-10-CM

## 2011-01-25 NOTE — Assessment & Plan Note (Signed)
REASON FOR VISIT:  Lower extremity pain due to peripheral neuropathy.  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  She states that for the first 2 days of her treatment with fentanyl patch, she does well and then for the third day she is taking up to 5 Tylenol per day for some breakthrough.  She has had no new problems in the interval time.  She continues to be on Cymbalta. Her pain scores around 3/10 on average, 7/10 at the current time prior to being on the patch.  Her average pain was in the 7-8/10 range.  Her pain is worse with walking, bending, sitting and standing; improves with medications.  Relief from meds is good.  She can climb steps.  She can drive.  She works 40 hours a week.  She has depression on her review of systems as well as sleep apnea, blood sugar regulation problems, weight gain.  PAST HISTORY:  Hypothyroidism, hypertension.  SOCIAL HISTORY:  Single, nonsmoker, nondrinker.  OBJECTIVE:  Blood pressure is 136/77, pulse 76, respirations 18 and O2 sat 96% on room air.  Weight 179 pounds.  Examination, general no acute distress.  Mood and affect appropriate. Her ankle range of motion is full. Strength in the lower extremities are normal.  Her sensation is intact to pinprick.  Her deep tendon reflexes are absent bilateral ankles and present and normal at bilateral knees.  IMPRESSION:  Peripheral neuropathy due to Wegener's granulomatosis.  She gets very good relief with narcotic analgesics in combination with Cymbalta.  Overall, she is doing as well as I have seen her, except for those third day breakthrough medications that she is requiring.  I will change her dosing to q.48 hours which should take care of that.  I went over opioid consent form with the patient emphasizing alcoholic beverages in addition to other medications can cause respiratory depression and overdose, she understands.  She will see Kallie Edward mid- level  in 1 month.  I will see her back in 6 months or sooner if change in treatment is required.     Erick Colace, M.D. Electronically Signed    AEK/MedQ D:  01/25/2011 13:00:59  T:  01/25/2011 22:43:48  Job #:  [id number]

## 2011-02-20 ENCOUNTER — Other Ambulatory Visit (HOSPITAL_COMMUNITY): Payer: Self-pay | Admitting: *Deleted

## 2011-02-20 ENCOUNTER — Encounter: Payer: Managed Care, Other (non HMO) | Attending: Neurosurgery | Admitting: Neurosurgery

## 2011-02-20 DIAGNOSIS — G609 Hereditary and idiopathic neuropathy, unspecified: Secondary | ICD-10-CM | POA: Insufficient documentation

## 2011-02-20 DIAGNOSIS — F341 Dysthymic disorder: Secondary | ICD-10-CM

## 2011-02-21 NOTE — Assessment & Plan Note (Signed)
The patient is currently on fentanyl 50 mcg every 2 days.  According to Dr. Lunette Stands note, he would want to see her back to change treatment. The patient acts today as that she is not happy with her current treatment.  She states it is no longer working and she has only been on this for a few months.  She rates her pain as a 3 to 7.  It is a burning stabbing type pain.  General activity level is 7.  The pain is same 24 hours a day.  Sleep patterns are fair.  All activities aggravate. Medication helps some.  She walks without assistance.  She climb steps and drive.  She works 40 hours a week.  REVIEW OF SYSTEMS:  Notable for difficulties as described above as well as some night sweats, weight gain, blood sugar fluctuations, paresthesias, depression.  No suicidal thoughts or aberrant behaviors. Counts correct.  UDS consistent.  PAST MEDICAL HISTORY:  Unchanged.  SOCIAL HISTORY:  Unchanged.  FAMILY HISTORY:  Unchanged.  PHYSICAL EXAMINATION:  VITAL SIGNS:  Blood pressure 149/79, pulse 88, respirations 14, O2 sats 98 on room air. NEUROLOGIC:  Sensation and strength are intact.  Constitutionally, she is obese.  She is alert and oriented x3, somewhat irritable.  She has a slight limp to her gait.  IMPRESSION:  Peripheral neuropathy, remote history of Wegener's granulomatosis.  PLAN:  I will refill fentanyl 50 mcg once transdermally every 48 hours #15 with no refill.  The patient does not seem happy that I am not offering narcotics.  We will refer her and defer her back to Dr. Wynn Banker next month.  Otherwise, questions were encouraged and answered.     Catheryn Slifer L. Blima Dessert Electronically Signed    RLW/MedQ D:  02/20/2011 13:27:02  T:  02/21/2011 04:01:30  Job #:  [id number]

## 2011-02-27 ENCOUNTER — Encounter (HOSPITAL_COMMUNITY)
Admission: RE | Admit: 2011-02-27 | Discharge: 2011-02-27 | Disposition: A | Discharge status: Home / Self Care | Payer: Managed Care, Other (non HMO) | Source: Ambulatory Visit | Attending: Rheumatology | Admitting: Rheumatology

## 2011-02-27 DIAGNOSIS — M3130 Wegener's granulomatosis without renal involvement: Secondary | ICD-10-CM | POA: Insufficient documentation

## 2011-02-27 MED ORDER — DIPHENHYDRAMINE HCL 25 MG PO TABS
25.0000 mg | ORAL_TABLET | Freq: Once | ORAL | Status: AC
  Administered 2011-02-27: 25 mg via ORAL

## 2011-02-27 MED ORDER — DIPHENHYDRAMINE HCL 25 MG PO CAPS
ORAL_CAPSULE | ORAL | Status: AC
  Filled 2011-02-27: qty 1

## 2011-02-27 MED ORDER — ACETAMINOPHEN 500 MG PO TABS
ORAL_TABLET | ORAL | Status: AC
  Administered 2011-02-27: 1000 mg via ORAL
  Filled 2011-02-27: qty 2

## 2011-02-27 MED ORDER — SODIUM CHLORIDE 0.9 % IV SOLN
375.0000 mg/m2 | INTRAVENOUS | Status: DC
  Administered 2011-02-27: 700 mg via INTRAVENOUS
  Filled 2011-02-27: qty 70

## 2011-02-27 MED ORDER — ACETAMINOPHEN 500 MG PO TABS
1000.0000 mg | ORAL_TABLET | Freq: Once | ORAL | Status: AC
  Administered 2011-02-27: 1000 mg via ORAL

## 2011-03-04 ENCOUNTER — Other Ambulatory Visit (HOSPITAL_COMMUNITY): Payer: Self-pay | Admitting: *Deleted

## 2011-03-08 ENCOUNTER — Encounter (HOSPITAL_COMMUNITY)
Admission: RE | Admit: 2011-03-08 | Discharge: 2011-03-08 | Disposition: A | Discharge status: Home / Self Care | Payer: Managed Care, Other (non HMO) | Source: Ambulatory Visit | Attending: Rheumatology | Admitting: Rheumatology

## 2011-03-08 MED ORDER — ACETAMINOPHEN 500 MG PO TABS
1000.0000 mg | ORAL_TABLET | Freq: Once | ORAL | Status: AC
  Administered 2011-03-08: 1000 mg via ORAL
  Filled 2011-03-08: qty 2

## 2011-03-08 MED ORDER — DIPHENHYDRAMINE HCL 25 MG PO TABS
25.0000 mg | ORAL_TABLET | Freq: Once | ORAL | Status: AC
  Administered 2011-03-08: 25 mg via ORAL
  Filled 2011-03-08 (×2): qty 1

## 2011-03-08 MED ORDER — SODIUM CHLORIDE 0.9 % IV SOLN
375.0000 mg/m2 | INTRAVENOUS | Status: DC
  Administered 2011-03-08: 700 mg via INTRAVENOUS
  Filled 2011-03-08: qty 70

## 2011-03-15 ENCOUNTER — Encounter (HOSPITAL_COMMUNITY)
Admission: RE | Admit: 2011-03-15 | Discharge: 2011-03-15 | Disposition: A | Discharge status: Home / Self Care | Payer: Managed Care, Other (non HMO) | Source: Ambulatory Visit | Attending: Rheumatology | Admitting: Rheumatology

## 2011-03-15 MED ORDER — DIPHENHYDRAMINE HCL 25 MG PO CAPS
ORAL_CAPSULE | ORAL | Status: AC
  Administered 2011-03-15: 25 mg via ORAL
  Filled 2011-03-15: qty 1

## 2011-03-15 MED ORDER — DIPHENHYDRAMINE HCL 25 MG PO TABS
25.0000 mg | ORAL_TABLET | Freq: Once | ORAL | Status: AC
  Administered 2011-03-15: 25 mg via ORAL

## 2011-03-15 MED ORDER — SODIUM CHLORIDE 0.9 % IV SOLN
375.0000 mg/m2 | Freq: Once | INTRAVENOUS | Status: AC
  Administered 2011-03-15: 700 mg via INTRAVENOUS
  Filled 2011-03-15: qty 70

## 2011-03-15 MED ORDER — ACETAMINOPHEN 325 MG PO TABS: 650.0000 mg | ORAL_TABLET | Freq: Once | ORAL | Status: DC

## 2011-03-15 MED ORDER — ACETAMINOPHEN 500 MG PO TABS
ORAL_TABLET | ORAL | Status: AC
  Administered 2011-03-15: 1000 mg via ORAL
  Filled 2011-03-15: qty 2

## 2011-03-15 MED ORDER — ACETAMINOPHEN 500 MG PO TABS
1000.0000 mg | ORAL_TABLET | Freq: Once | ORAL | Status: AC
  Administered 2011-03-15: 1000 mg via ORAL

## 2011-03-19 ENCOUNTER — Ambulatory Visit: Payer: Managed Care, Other (non HMO) | Admitting: Physical Medicine & Rehabilitation

## 2011-03-19 ENCOUNTER — Encounter: Payer: Managed Care, Other (non HMO) | Attending: Physical Medicine & Rehabilitation

## 2011-03-19 DIAGNOSIS — R21 Rash and other nonspecific skin eruption: Secondary | ICD-10-CM | POA: Insufficient documentation

## 2011-03-19 DIAGNOSIS — I10 Essential (primary) hypertension: Secondary | ICD-10-CM | POA: Insufficient documentation

## 2011-03-19 DIAGNOSIS — R61 Generalized hyperhidrosis: Secondary | ICD-10-CM | POA: Insufficient documentation

## 2011-03-19 DIAGNOSIS — M79609 Pain in unspecified limb: Secondary | ICD-10-CM | POA: Insufficient documentation

## 2011-03-19 DIAGNOSIS — R209 Unspecified disturbances of skin sensation: Secondary | ICD-10-CM | POA: Insufficient documentation

## 2011-03-19 DIAGNOSIS — Z79899 Other long term (current) drug therapy: Secondary | ICD-10-CM | POA: Insufficient documentation

## 2011-03-22 ENCOUNTER — Encounter (HOSPITAL_COMMUNITY)
Admission: RE | Admit: 2011-03-22 | Discharge: 2011-03-22 | Disposition: A | Discharge status: Home / Self Care | Payer: Managed Care, Other (non HMO) | Source: Ambulatory Visit | Attending: Rheumatology | Admitting: Rheumatology

## 2011-03-22 MED ORDER — SODIUM CHLORIDE 0.9 % IV SOLN
375.0000 mg/m2 | INTRAVENOUS | Status: AC
  Administered 2011-03-22: 700 mg via INTRAVENOUS
  Filled 2011-03-22: qty 70

## 2011-03-22 MED ORDER — ACETAMINOPHEN 500 MG PO TABS
1000.0000 mg | ORAL_TABLET | Freq: Once | ORAL | Status: AC
  Administered 2011-03-22: 1000 mg via ORAL
  Filled 2011-03-22: qty 2

## 2011-03-22 MED ORDER — DIPHENHYDRAMINE HCL 25 MG PO CAPS
25.0000 mg | ORAL_CAPSULE | Freq: Once | ORAL | Status: AC
  Administered 2011-03-22: 25 mg via ORAL
  Filled 2011-03-22: qty 1

## 2011-04-08 ENCOUNTER — Encounter: Payer: Managed Care, Other (non HMO) | Attending: Physical Medicine & Rehabilitation

## 2011-04-08 ENCOUNTER — Ambulatory Visit: Payer: Managed Care, Other (non HMO) | Admitting: Physical Medicine & Rehabilitation

## 2011-04-08 DIAGNOSIS — G587 Mononeuritis multiplex: Secondary | ICD-10-CM

## 2011-04-08 DIAGNOSIS — M79609 Pain in unspecified limb: Secondary | ICD-10-CM | POA: Insufficient documentation

## 2011-04-08 DIAGNOSIS — R21 Rash and other nonspecific skin eruption: Secondary | ICD-10-CM | POA: Insufficient documentation

## 2011-04-08 DIAGNOSIS — I10 Essential (primary) hypertension: Secondary | ICD-10-CM | POA: Insufficient documentation

## 2011-04-08 DIAGNOSIS — Z79899 Other long term (current) drug therapy: Secondary | ICD-10-CM | POA: Insufficient documentation

## 2011-04-08 DIAGNOSIS — R61 Generalized hyperhidrosis: Secondary | ICD-10-CM | POA: Insufficient documentation

## 2011-04-08 DIAGNOSIS — R209 Unspecified disturbances of skin sensation: Secondary | ICD-10-CM | POA: Insufficient documentation

## 2011-04-09 NOTE — Assessment & Plan Note (Signed)
CHIEF COMPLAINT:  Back pain.  Secondary complaint is left lower extremity pain.  [name] gives a history of Wegener granulomatosis diagnosed in 2005, skin rash associated with this.  At one point was on OxyContin 40 b.i.d. and oxycodone 5 mg t.i.d.  She had EMG NCV demonstrating a mononeuritis multiplex, which is the nerve pattern consistent with the underlying diagnosis of Wegener granulomatosis.  Looking at her medications, she has been trialed on Opana ER 20 mg b.i.d., which was equal analgesic to OxyContin.  Her current regimen really has been working the best.  Looking back at her pain scores, she was first evaluated at a 7/10, on average pain 8/10 on October 19, 2010, but since starting on her fentanyl regimen has been down to 3/10 on January 25, 2011; 6 on February 20, 2011; and 4/10 today.  We had the bump her to q.48 hour dosing.  Looking at equal analgesic dosing, this would be equivalent to her oxycodone or OxyContin or Opana dose.  In addition, she has been on Cymbalta which has been helpful.  She still has some depression on review of systems as well as tingling, some weight gain, and blood sugar regulation problems.  PAST MEDICAL HISTORY:  Significant for hypothyroid and hypertension.  Other medications trialed, nortriptyline 10 mg at bedtime, has not tried a higher dose.  No reported side effects with the nortriptyline.  PHYSICAL EXAMINATION:  MUSCULOSKELETAL:  She has decreased ankle jerks bilaterally, knee jerk is 2+ bilaterally.  She has normal pinprick sensation, proprioception, light touch.  No evidence of skin breakdown. Good pulses.  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  PLAN:  We will continue on her current regimen.  We discussed this at some length problems were that  she is still working, concerned about over sedation on a higher dose of fentanyl, and the fact that  she may not derive any further benefit from her narcotic analgesic than she is already enjoyed thus far.  We also discussed Nucynta, however, she would have to come off her Cymbalta first and this would likely result in a rocky 2-3 weeks in the transition.  Nursing visit in 1 month.  I will see her in 3 months.  May consider some nortriptyline 25 mg at night if she has some first thing in the morning type discomfort.     Laura [name], M.D. Electronically Signed    AEK/MedQ D:  04/08/2011 14:21:20  T:  04/09/2011 00:23:56  Job #:  [id number]  cc:   Laura Showers, MD Fax: [phone]

## 2011-05-06 ENCOUNTER — Encounter: Payer: Self-pay | Admitting: *Deleted

## 2011-05-06 ENCOUNTER — Encounter: Payer: Managed Care, Other (non HMO) | Attending: Physical Medicine & Rehabilitation | Admitting: *Deleted

## 2011-05-06 VITALS — BP 148/91 | HR 117 | Resp 18 | Ht 64.0 in | Wt 190.0 lb

## 2011-05-06 DIAGNOSIS — Z79899 Other long term (current) drug therapy: Secondary | ICD-10-CM | POA: Insufficient documentation

## 2011-05-06 DIAGNOSIS — G587 Mononeuritis multiplex: Secondary | ICD-10-CM | POA: Insufficient documentation

## 2011-05-06 DIAGNOSIS — G609 Hereditary and idiopathic neuropathy, unspecified: Secondary | ICD-10-CM

## 2011-05-06 DIAGNOSIS — M3130 Wegener's granulomatosis without renal involvement: Secondary | ICD-10-CM | POA: Insufficient documentation

## 2011-05-06 DIAGNOSIS — I10 Essential (primary) hypertension: Secondary | ICD-10-CM | POA: Insufficient documentation

## 2011-05-06 DIAGNOSIS — E039 Hypothyroidism, unspecified: Secondary | ICD-10-CM | POA: Insufficient documentation

## 2011-05-06 MED ORDER — FENTANYL 50 MCG/HR TD PT72: 1.0000 | MEDICATED_PATCH | TRANSDERMAL | Status: DC

## 2011-05-06 NOTE — Progress Notes (Signed)
Requests breakthrough pain medication. States q48hr fentanyl is better than q72hr dosing, but still notes increasing discomfort before time to change patch. Will ask Dr Wynn Banker about this. States not overly fatigued or tired on this dose of fentanyl. States meds are kept in safe place.

## 2011-05-09 ENCOUNTER — Telehealth: Payer: Self-pay | Admitting: *Deleted

## 2011-05-09 NOTE — Telephone Encounter (Signed)
Needs MD appointment to discuss any change to her medication. She may call to see if he has open app'ts for next month when her Rx's are due.

## 2011-05-30 ENCOUNTER — Encounter: Payer: Self-pay | Admitting: Physical Medicine & Rehabilitation

## 2011-06-06 ENCOUNTER — Encounter: Payer: Managed Care, Other (non HMO) | Attending: Physical Medicine & Rehabilitation

## 2011-06-14 ENCOUNTER — Encounter: Payer: Managed Care, Other (non HMO) | Attending: Physical Medicine & Rehabilitation

## 2011-06-14 ENCOUNTER — Encounter: Payer: Self-pay | Admitting: Physical Medicine & Rehabilitation

## 2011-06-14 ENCOUNTER — Ambulatory Visit (HOSPITAL_BASED_OUTPATIENT_CLINIC_OR_DEPARTMENT_OTHER): Payer: Managed Care, Other (non HMO) | Admitting: Physical Medicine & Rehabilitation

## 2011-06-14 VITALS — BP 145/89 | HR 98 | Ht 64.0 in | Wt 185.0 lb

## 2011-06-14 DIAGNOSIS — R209 Unspecified disturbances of skin sensation: Secondary | ICD-10-CM | POA: Insufficient documentation

## 2011-06-14 DIAGNOSIS — G894 Chronic pain syndrome: Secondary | ICD-10-CM

## 2011-06-14 DIAGNOSIS — R61 Generalized hyperhidrosis: Secondary | ICD-10-CM | POA: Insufficient documentation

## 2011-06-14 DIAGNOSIS — I10 Essential (primary) hypertension: Secondary | ICD-10-CM | POA: Insufficient documentation

## 2011-06-14 DIAGNOSIS — M79609 Pain in unspecified limb: Secondary | ICD-10-CM | POA: Insufficient documentation

## 2011-06-14 DIAGNOSIS — G5790 Unspecified mononeuropathy of unspecified lower limb: Secondary | ICD-10-CM | POA: Insufficient documentation

## 2011-06-14 DIAGNOSIS — Z79899 Other long term (current) drug therapy: Secondary | ICD-10-CM | POA: Insufficient documentation

## 2011-06-14 DIAGNOSIS — R21 Rash and other nonspecific skin eruption: Secondary | ICD-10-CM | POA: Insufficient documentation

## 2011-06-14 MED ORDER — FENTANYL 50 MCG/HR TD PT72: 1.0000 | MEDICATED_PATCH | TRANSDERMAL | Status: DC

## 2011-06-14 MED ORDER — NORTRIPTYLINE HCL 10 MG PO CAPS: 10.0000 mg | ORAL_CAPSULE | Freq: Every day | ORAL | Status: DC

## 2011-06-14 NOTE — Patient Instructions (Signed)
Start taking the nortriptyline at night. This is for sleep as well as chronic nerve pain. Continue current dose of Duragesic patch. Do not drink any alcohol he not take any tranquilizer medications as this can interact and cause over dose.

## 2011-06-14 NOTE — Progress Notes (Signed)
  Subjective:    Patient ID: Laura [name], female    DOB: 1965/05/04, 46 y.o.   MRN: [id number]  HPI [name] gives a history of Wegener granulomatosis diagnosed  in 2005, skin rash associated with this. At one point was on OxyContin  40 b.i.d. and oxycodone 5 mg t.i.d. She had EMG NCV demonstrating a  mononeuritis multiplex, which is the nerve pattern consistent with the  underlying diagnosis of Wegener granulomatosis. Looking at her  medications, she has been trialed on Opana ER 20 mg b.i.d., which was  equal analgesic to OxyContin. Her current regimen really has been  working the best. Looking back at her pain scores, she was first  evaluated at a 7/10, on average pain 8/10 on October 19, 2010, but since  starting on her fentanyl regimen has been down to 3/10 on January 25, 2011; 6 on February 20, 2011; and 4/10 today. We had the bump her to  q.48 hour dosing.  Pain Inventory Average Pain 5 Pain Right Now 4 My pain is sharp, burning, stabbing and tingling  In the last 24 hours, has pain interfered with the following? General activity 4 Relation with others 4 Enjoyment of life 4 What TIME of day is your pain at its worst? all day Sleep (in general) Fair  Pain is worse with: walking, bending, sitting, inactivity and standing Pain improves with: medication Relief from Meds: 7  Mobility walk without assistance ability to climb steps?  yes do you drive?  yes  Function employed # of hrs/week 40  Neuro/Psych No problems in this area  Prior Studies Any changes since last visit?  no  Physicians involved in your care Any changes since last visit?  no   Review of Systems  All other systems reviewed and are negative.       Objective:   Physical Exam  Musculoskeletal:       Right ankle: Normal.       Left ankle: Normal.  Neurological: She has normal strength. A sensory deficit is present.  Reflex Scores:      Patellar reflexes are 2+ on the  right side and 2+ on the left side.      Achilles reflexes are 2+ on the right side and 2+ on the left side.         Assessment & Plan:  1. 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 Her examination is stable. Her pain scores are stable. She does have some poor sleep at night which is sometimes related to her pain. Will restart the nortriptyline. We'll continue the fentanyl 50 mcg patch as well as continue Cymbalta 60 mg a day.  We discussed no alcohol as well as not taking any type of tranquilizer medicines that can interact with her fentanyl. This can result in overdose or even death.

## 2011-07-16 ENCOUNTER — Encounter (INDEPENDENT_AMBULATORY_CARE_PROVIDER_SITE_OTHER): Payer: Self-pay | Admitting: Internal Medicine

## 2011-07-16 ENCOUNTER — Encounter (INDEPENDENT_AMBULATORY_CARE_PROVIDER_SITE_OTHER): Payer: Self-pay | Admitting: General Surgery

## 2011-07-16 ENCOUNTER — Encounter
Payer: Managed Care, Other (non HMO) | Attending: Physical Medicine & Rehabilitation | Admitting: Physical Medicine and Rehabilitation

## 2011-07-16 ENCOUNTER — Ambulatory Visit (INDEPENDENT_AMBULATORY_CARE_PROVIDER_SITE_OTHER): Payer: Managed Care, Other (non HMO) | Admitting: Internal Medicine

## 2011-07-16 ENCOUNTER — Encounter: Payer: Self-pay | Admitting: Physical Medicine and Rehabilitation

## 2011-07-16 ENCOUNTER — Encounter (INDEPENDENT_AMBULATORY_CARE_PROVIDER_SITE_OTHER): Payer: Managed Care, Other (non HMO) | Admitting: Internal Medicine

## 2011-07-16 VITALS — BP 126/92 | HR 101 | Temp 97.0°F | Ht 64.0 in | Wt 181.2 lb

## 2011-07-16 VITALS — BP 148/80 | HR 87 | Ht 63.0 in | Wt 181.0 lb

## 2011-07-16 DIAGNOSIS — K611 Rectal abscess: Secondary | ICD-10-CM

## 2011-07-16 DIAGNOSIS — M3130 Wegener's granulomatosis without renal involvement: Secondary | ICD-10-CM | POA: Insufficient documentation

## 2011-07-16 DIAGNOSIS — G587 Mononeuritis multiplex: Secondary | ICD-10-CM | POA: Insufficient documentation

## 2011-07-16 DIAGNOSIS — G5790 Unspecified mononeuropathy of unspecified lower limb: Secondary | ICD-10-CM

## 2011-07-16 DIAGNOSIS — K612 Anorectal abscess: Secondary | ICD-10-CM

## 2011-07-16 MED ORDER — FENTANYL 50 MCG/HR TD PT72: 1.0000 | MEDICATED_PATCH | TRANSDERMAL | Status: DC

## 2011-07-16 NOTE — Patient Instructions (Addendum)
Continue medication, and try ice massage the bottom of your feet, with a frozen water bottle in the evening.

## 2011-07-16 NOTE — Patient Instructions (Signed)
Patient will do sitz baths 3 times daily with warm water. She will keep a moist dressing on the incisions and changed these twice daily. She will followup with [place] in one week for recheck or sooner if needed.

## 2011-07-16 NOTE — Progress Notes (Signed)
  Subjective:    Patient ID: Laura [name], female    DOB: 20-Feb-1966, 46 y.o.   MRN: [id number]  HPI [name] gives a history of Wegener granulomatosis diagnosed  in 2005, skin rash associated with this. At one point was on OxyContin  40 b.i.d. and oxycodone 5 mg t.i.d. She had EMG NCV demonstrating a  mononeuritis multiplex, which is the nerve pattern consistent with the  underlying diagnosis of Wegener granulomatosis. Looking at her  medications, she has been trialed on Opana ER 20 mg b.i.d., which was  equal analgesic to OxyContin. Her current regimen really has been  working the best. She is using Fentanyl patches q 48 hrs. Her symptoms are stable.    Pain Inventory Average Pain 4 Pain Right Now 5 My pain is sharp, burning, stabbing and tingling  In the last 24 hours, has pain interfered with the following? General activity 3 Relation with others 3 Enjoyment of life 3 What TIME of day is your pain at its worst? all the time Sleep (in general) Fair  Pain is worse with: walking, bending, sitting and standing Pain improves with: medication Relief from Meds: 6  Mobility walk without assistance ability to climb steps?  yes do you drive?  yes Do you have any goals in this area?  no  Function employed # of hrs/week 40 Do you have any goals in this area?  no  Neuro/Psych depression  Prior Studies Any changes since last visit?  no  Physicians involved in your care Any changes since last visit?  no       Review of Systems  Constitutional: Positive for diaphoresis and unexpected weight change.  Psychiatric/Behavioral: Positive for dysphoric mood.  All other systems reviewed and are negative.       Objective:   Physical Exam  Constitutional: She is oriented to person, place, and time. She appears well-developed.  HENT:  Head: Normocephalic.  Eyes: Pupils are equal, round, and reactive to light.  Neck: Normal range of motion.    Musculoskeletal: Normal range of motion.  Neurological: She is alert and oriented to person, place, and time. She has normal reflexes.  Skin: Skin is warm.  Psychiatric: She has a normal mood and affect.          Assessment & Plan:     1. 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 Her examination is stable. Her pain scores are stable.  We'll continue the fentanyl 50 mcg patch as well as her cymbalta. Advised patient to perform a ice massage on the sole of her feet, with a frozen water bottle, by rolling her feet on it. Patient will follow up in a month.

## 2011-07-24 ENCOUNTER — Encounter (INDEPENDENT_AMBULATORY_CARE_PROVIDER_SITE_OTHER): Payer: Managed Care, Other (non HMO) | Admitting: Surgery

## 2011-08-13 ENCOUNTER — Encounter: Payer: Self-pay | Admitting: Physical Medicine and Rehabilitation

## 2011-08-13 ENCOUNTER — Encounter
Payer: Managed Care, Other (non HMO) | Attending: Physical Medicine & Rehabilitation | Admitting: Physical Medicine and Rehabilitation

## 2011-08-13 VITALS — BP 129/81 | HR 99 | Resp 14 | Ht 64.0 in | Wt 181.0 lb

## 2011-08-13 DIAGNOSIS — E039 Hypothyroidism, unspecified: Secondary | ICD-10-CM | POA: Insufficient documentation

## 2011-08-13 DIAGNOSIS — M79609 Pain in unspecified limb: Secondary | ICD-10-CM

## 2011-08-13 DIAGNOSIS — M79671 Pain in right foot: Secondary | ICD-10-CM

## 2011-08-13 DIAGNOSIS — I10 Essential (primary) hypertension: Secondary | ICD-10-CM | POA: Insufficient documentation

## 2011-08-13 DIAGNOSIS — M3130 Wegener's granulomatosis without renal involvement: Secondary | ICD-10-CM | POA: Insufficient documentation

## 2011-08-13 DIAGNOSIS — G587 Mononeuritis multiplex: Secondary | ICD-10-CM | POA: Insufficient documentation

## 2011-08-13 MED ORDER — FENTANYL 50 MCG/HR TD PT72: 1.0000 | MEDICATED_PATCH | TRANSDERMAL | Status: DC

## 2011-08-13 NOTE — Progress Notes (Signed)
Subjective:    Patient ID: Laura [name], female    DOB: Jan 06, 1966, 46 y.o.   MRN: [id number]  HPI Subjective:    HPI  [name] gives a history of Wegener granulomatosis diagnosed  in 2005, skin rash associated with this. At one point was on OxyContin  40 b.i.d. and oxycodone 5 mg t.i.d. She had EMG NCV demonstrating a  mononeuritis multiplex, which is the nerve pattern consistent with the  underlying diagnosis of Wegener granulomatosis. Looking at her  medications, she has been trialed on Opana ER 20 mg b.i.d., which was  equal analgesic to OxyContin. Her current regimen really has been  working the best. She is using Fentanyl patches q 48 hrs. Her symptoms are stable. She has tried the ice bottle massage for her feet, which gave her some relief.   Pain Inventory Average Pain 4 Pain Right Now 7 My pain is sharp, burning, stabbing and tingling  In the last 24 hours, has pain interfered with the following? General activity 4 Relation with others 4 Enjoyment of life 4 What TIME of day is your pain at its worst? all the tiem Sleep (in general) Fair  Pain is worse with: walking, bending, sitting, inactivity and standing Pain improves with: medication Relief from Meds: 6  Mobility walk without assistance ability to climb steps?  yes do you drive?  yes Do you have any goals in this area?  yes  Function employed # of hrs/week 40 Do you have any goals in this area?  no  Neuro/Psych No problems in this area  Prior Studies Any changes since last visit?  no  Physicians involved in your care Any changes since last visit?  no   Family History  Problem Relation Age of Onset  . Diabetes Mother   . Hypertension Mother   . Cancer Mother    History   Social History  . Marital Status: Single    Spouse Name: N/A    Number of Children: N/A  . Years of Education: N/A   Social History Main Topics  . Smoking status: Former Smoker    Quit date:  05/06/2007  . Smokeless tobacco: None  . Alcohol Use: No  . Drug Use: No  . Sexually Active: None   Other Topics Concern  . None   Social History Narrative  . None   History reviewed. No pertinent past surgical history. Past Medical History  Diagnosis Date  . Mononeuritis multiplex   . Unspecified hereditary and idiopathic peripheral neuropathy   . Hypertension   . Hypothyroidism   . Hearing loss   . Rash   . Joint pain   . Ischemic bowel disease   . Cyst   . Wegener's granulomatosis   . Nasal bleeding   . Anxiety     generalized   BP 129/81  Pulse 99  Resp 14  Ht 5\' 4"  (1.626 m)  Wt 181 lb (82.101 kg)  BMI 31.07 kg/m2  SpO2 100%     Review of Systems  All other systems reviewed and are negative.       Objective:   Physical Exam  Constitutional: She is oriented to person, place, and time. She appears well-developed and well-nourished.  HENT:  Head: Normocephalic.  Neck: Neck supple.  Musculoskeletal: She exhibits tenderness.  Neurological: She is alert and oriented to person, place, and time.  Skin: Skin is warm and dry.  Psychiatric: She has a normal mood and affect.  Symmetric normal motor tone is noted throughout. Normal muscle bulk. Muscle testing reveals 5/5 muscle strength of the upper extremity, and 5/5 of the lower extremity. Full range of motion in upper and lower extremities. ROM of spine is not restricted. Fine motor movements are normal in both hands.  DTR in the upper and lower extremity are present and symmetric 3+. No clonus is noted.  Patient arises from chair without difficulty. Narrow based gait with normal arm swing bilateral , able to walk on heels and toes . Tandem walk is stable. No pronator drift. Rhomberg negative.      Assessment & Plan:  1. 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 Her examination is stable. Her pain scores are stable. We'll continue the fentanyl 50 mcg patch as well as her cymbalta.  Advised  patient to perform a ice massage on the sole of her feet, with a frozen water bottle, by rolling her feet on it, or do some foot massage by rolling over a tennis, or massage ball. Patient will follow up in a month.

## 2011-08-13 NOTE — Patient Instructions (Signed)
Continue with foot massage with a ball or ice bottle, continue with medication.

## 2011-08-15 ENCOUNTER — Ambulatory Visit (INDEPENDENT_AMBULATORY_CARE_PROVIDER_SITE_OTHER): Payer: Managed Care, Other (non HMO) | Admitting: Women's Health

## 2011-08-15 ENCOUNTER — Encounter: Payer: Self-pay | Admitting: Women's Health

## 2011-08-15 VITALS — BP 160/100 | Ht 62.5 in | Wt 184.0 lb

## 2011-08-15 DIAGNOSIS — M3130 Wegener's granulomatosis without renal involvement: Secondary | ICD-10-CM

## 2011-08-15 DIAGNOSIS — Z780 Asymptomatic menopausal state: Secondary | ICD-10-CM

## 2011-08-15 DIAGNOSIS — I10 Essential (primary) hypertension: Secondary | ICD-10-CM | POA: Insufficient documentation

## 2011-08-15 DIAGNOSIS — E039 Hypothyroidism, unspecified: Secondary | ICD-10-CM | POA: Insufficient documentation

## 2011-08-15 DIAGNOSIS — F419 Anxiety disorder, unspecified: Secondary | ICD-10-CM | POA: Insufficient documentation

## 2011-08-15 DIAGNOSIS — Z01419 Encounter for gynecological examination (general) (routine) without abnormal findings: Secondary | ICD-10-CM

## 2011-08-15 MED ORDER — VENLAFAXINE HCL 37.5 MG PO TABS: 37.5000 mg | ORAL_TABLET | Freq: Two times a day (BID) | ORAL | Status: DC

## 2011-08-15 MED ORDER — ESTRADIOL-NORETHINDRONE ACET 0.5-0.1 MG PO TABS: 1.0000 | ORAL_TABLET | Freq: Every day | ORAL | Status: DC

## 2011-08-15 NOTE — Progress Notes (Addendum)
Laura [name] [birth year] [id number]    History:    The patient presents for annual exam. Postmenopausal/HRT 2008-2010 with no problems/no bleeding. Currently complains of hot flashes/night sweats, worsened over past 3 years, poor sleep.. Last Pap 2012, abnormal Pap 1998, normal paps after per pt. Last mammogram 2008, normal.  Tdap 2010. Colonoscopy 2011, no polyps. Hx Wegener's granulomatosis-steroid therapy/rheumatology. Hx HTN and depression-primary care.    Past medical history, past surgical history, family history and social history were all reviewed and documented in the EPIC chart. Family Hx breast cancer, mother and MGM, unsure of age. 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    ROS:  A  ROS was performed and pertinent positives and negatives are included in the history.  Exam:  Filed Vitals:   08/15/11 1530  BP: 160/100    General appearance:  Normal Head/Neck:  Normal, without cervical or supraclavicular adenopathy. Thyroid:  Symmetrical, normal in size, without palpable masses or nodularity. Respiratory  Effort:  Normal  Auscultation:  Clear without wheezing or rhonchi Cardiovascular  Auscultation:  Regular rate, without rubs, murmurs or gallops  Edema/varicosities:  Not grossly evident Abdominal  Soft,nontender, without masses, guarding or rebound.  Liver/spleen:  No organomegaly noted  Hernia:  None appreciated  Skin  Inspection:  Grossly normal  Palpation:  Grossly normal Neurologic/psychiatric  Orientation:  Normal with appropriate conversation.  Mood/affect:  Normal  Genitourinary    Breasts: Examined lying and sitting.     Right: Without masses, retractions, discharge or axillary adenopathy.     Left: Without masses, retractions, discharge or axillary adenopathy.   Inguinal/mons:  Normal without inguinal adenopathy  External genitalia:  Normal  BUS/Urethra/Skene's glands:  Normal  Bladder:  Normal  Vagina:  Normal  Cervix:  Normal  Uterus: Normal in  size, shape and contour.  Midline and mobile  Adnexa/parametria:     Rt: Without masses or tenderness.   Lt: Without masses or tenderness.  Anus and perineum: Normal  Digital rectal exam: Normal sphincter tone without palpated masses or tenderness  Assessment:  46 y.o. SBF G3P2 for annual exam with complaints of hot flashes/night sweats X 3 years  Postmenopausal exam-HRT 2008-2010 with no problems/no bleeding/currently hot flashes and night sweats. Wegener's Granulamatosis-meds/labs per rheumatology. HTN-meds/labs per primary care.  Plan: Instructed to schedule DEXA: postmenopausal/steroid therapy. Fall precautions reviewed. Instructed to schedule mammogram/overdue. SBE's Ca rich diet, Vit. E, Vit D 2000 IU daily, daily exercise, and leisure activities encouraged. Pap done, new screening guidelines reviewed. Encouraged condom use if become sexually active for infection control. Options of vitamin E. twice daily, Effexor for hot flushes reviewed. Requested HRT for symptom control. Activella 0.5-0.1 mg prescription, use reviewed, risk for blood clots/stroke/heart attack/breast cancer reviewed. Instructed to notify of any vaginal bleeding. Home hemoccult card given.  B/P elevated at 160/100,  Discussed importance of F/U with primary care, which is scheduled next week for B/P management.  Harrington Challenger [facility], 4:25 PM 08/15/2011

## 2011-08-15 NOTE — Patient Instructions (Addendum)
Vit e twice daily Vit Health Recommendations for Postmenopausal Women Based on the Results of the Women's Health Initiative [facility]) and Other Studies The WHI is a major 15-year research program to address the most common causes of death, disability and poor quality of life in postmenopausal women. Some of these causes are heart disease, cancer, bone loss (osteoporosis) and others. Taking into account all of the findings from [facility] and other studies, here are bottom-line health recommendations for women: CARDIOVASCULAR DISEASE Heart Disease: A heart attack is a medical emergency. Know the signs and symptoms of a heart attack. Hormone therapy should not be used to prevent heart disease. In women with heart disease, hormone therapy should not be used to prevent further disease. Hormone therapy increases the risk of blood clots. Below are things women can do to reduce their risk for heart disease.   Do not smoke. If you smoke, quit. Women who smoke are 2 to 6 times more likely to suffer a heart attack than non-smoking women.   Aim for a healthy weight. Being overweight causes many preventable deaths. Eat a healthy and balanced diet and drink an adequate amount of liquids.   Get moving. Make a commitment to be more physically active. Aim for 30 minutes of activity on most, if not all days of the week.   Eat for heart health. Choose a diet that is low in saturated fat, trans fat, and cholesterol. Include whole grains, vegetables, and fruits. Read the labels on the food container before buying it.   Know your numbers. Ask your caregiver to check your blood pressure, cholesterol (total, HDL, LDL, triglycerides) and blood glucose. Work with your caregiver to improve any numbers that are not normal.   High blood pressure. Limit or stop your table salt intake (try salt substitute and food seasonings), avoid salty foods and drinks. Read the labels on the food container before buying it. Avoid becoming overweight by  eating well and exercising.  STROKE  Stroke is a medical emergency. Stroke can be the result of a blood clot in the blood vessel in the brain or by a brain hemorrhage (bleeding). Know the signs and symptoms of a stroke. To lower the risk of developing a stroke:  Avoid fatty foods.   Quit smoking.   Control your diabetes, blood pressure, and irregular heart rate.  THROMBOPHLIBITIS (BLOOD CLOT) OF THE LEG  Hormone treatment is a big cause of developing blood clots in the leg. Becoming overweight and leading a stationary lifestyle also may contribute to developing blood clots. Controlling your diet and exercising will help lower the risk of developing blood clots. CANCER SCREENING  Breast Cancer: Women should take steps to reduce their risk of breast cancer. This includes having regular mammograms, monthly self breast exams and regular breast exams by your caregiver. Have a mammogram every one to two years if you are 59 to 44 years old. Have a mammogram annually if you are 71 years old or older depending on your risk factors. Women who are high risk for breast cancer may need more frequent mammograms. There are tests available (testing the genes in your body) if you have family history of breast cancer called BRCA 1 and 2. These tests can help determine the risks of developing breast cancer.   Intestinal or Stomach Cancer: Women should talk to their caregiver about when to start screening, what tests and how often they should be done, and the benefits and risks of doing these tests. Tests to consider  are a rectal exam, fecal occult blood, sigmoidoscopy, colononoscoby, barium enema and upper GI series of the stomach. Depending on the age, you may want to get a medical and family history of colon cancer. Women who are high risk may need to be screened at an earlier age and more often.   Cervical Cancer: A Pap test of the cervix should be done every year and every 3 years when there has been three  straight years of a normal Pap test. Women with an abnormal Pap test should be screened more often or have a cervical biopsy depending on your caregiver's recommendation.   Uterine Cancer: If you have vaginal bleeding after you are in the menopause, it should be evaluated by your caregiver.   Ovarian cancer: There are no reliable tests available to screen for ovarian cancer at this time except for yearly pelvic exams.   Lung Cancer: Yearly chest X-rays can detect lung cancer and should be done on high risk women, such as cigarette smokers and women with chronic lung disease (emphysemia).   Skin Cancer: A complete body skin exam should be done at your yearly examination. Avoid overexposure to the sun and ultraviolet light lamps. Use a strong sun block cream when in the sun. All of these things are important in lowering the risk of skin cancer.  MENOPAUSE Menopause Symptoms: Hormone therapy products are effective for treating symptoms associated with menopause:  Moderate to severe hot flashes.   Night sweats.   Mood swings.   Headaches.   Tiredness.   Loss of sex drive.   Insomnia.   Other symptoms.  However, hormone therapy products carry serious risks, especially in older women. Women who use or are thinking about using estrogen or estrogen with progestin treatments should discuss that with their caregiver. Your caregiver will know if the benefits outweigh the risks. The Food and Drug Administration (FDA) has concluded that hormone therapy should be used only at the lowest doses and for the shortest amount of time to reach treatment goals. It is not known at what doses there may be less risk of serious side effects. There are other treatments such as herbal medication (not controlled or regulated by the FDA), group therapy, counseling and acupuncture that may be helpful. OSTEOPOROSIS Protecting Against Bone Loss and Preventing Fracture: If hormone therapy is used for prevention of bone  loss (osteoporosis), the risks for bone loss must outweigh the risk of the therapy. Women considering taking hormone therapy for bone loss should ask their health care providers about other medications (fosamax and boniva) that are considered safe and effective for preventing bone loss and bone fractures. To guard against bone loss or fractures, it is recommended that women should take at least 1000-1500 mg of calcium and 400-800 IU of vitamin D daily in divided doses. Smoking and excessive alcohol intake increases the risk of osteoporosis. Eat foods rich in calcium and vitamin D and do weight bearing exercises several times a week as your caregiver suggests. DIABETES Diabetes Melitus: Women with Type I or Type 2 diabetes should keep their diabetes in control with diet, exercise and medication. Avoid too many sweets, starchy and fatty foods. Being overweight can affect your diabetes. COGNITION AND MEMORY Cognition and Memory: Menopausal hormone therapy is not recommended for the prevention of cognitive disorders such as Alzheimer's disease or memory loss. WHI found that women treated with hormone therapy have a greater risk of developing dementia.  DEPRESSION  Depression may occur at any age, but  is common in elderly women. The reasons may be because of physical, medical, social (loneliness), financial and/or economic problems and needs. Becoming involved with church, volunteer or social groups, seeking treatment for any physical or medical problems is recommended. Also, look into getting professional advice for any economic or financial problems. ACCIDENTS  Accidents are common and can be serious in the elderly woman. Prepare your house to prevent accidents. Eliminate throw rugs, use hip protectors, place hand bars in the bath, shower and toilet areas. Avoid wearing high heel shoes and walking on wet, snowy and icy areas. Stop driving if you have vision, hearing problems or are unsteady with you movements  and reflexes. RHEUMATOID ARTHRITIS Rheumatoid arthritis causes pain, swelling and stiffness of your bone joints. It can limit many of your activities. Over-the-counter medications may help, but prescription medications may be necessary. Talk with your caregiver about this. Exercise (walking, water aerobics), good posture, using splints on painful joints, warm baths or applying warm compresses to stiff joints and cold compresses to painful joints may be helpful. Smoking and excessive drinking may worsen the symptoms of arthritis. Seek help from a physical therapist if the arthritis is becoming a problem with your daily activities. IMMUNIZATIONS  Several immunizations are important to have during your senior years, including:   Tetanus and a diptheria shot booster every 10 years.   Influenza every year before the flu season begins.   Pneumonia vaccine.   Shingles vaccine.   Others as indicated (example: H1N1 vaccine).  Document Released: 04/05/2005 Document Revised: 01/31/2011 Document Reviewed: 11/30/2007 [facility] Patient Information 2012 [employer]d 2000 daily

## 2011-08-16 ENCOUNTER — Other Ambulatory Visit (HOSPITAL_COMMUNITY)
Admission: RE | Admit: 2011-08-16 | Discharge: 2011-08-16 | Disposition: A | Discharge status: Home / Self Care | Payer: Managed Care, Other (non HMO) | Source: Ambulatory Visit | Attending: Obstetrics and Gynecology | Admitting: Obstetrics and Gynecology

## 2011-08-16 DIAGNOSIS — Z01419 Encounter for gynecological examination (general) (routine) without abnormal findings: Secondary | ICD-10-CM | POA: Insufficient documentation

## 2011-08-16 NOTE — Addendum Note (Signed)
Addended by: Bertram Savin A on: 08/16/2011 10:11 AM   Modules accepted: Orders

## 2011-09-05 ENCOUNTER — Emergency Department (HOSPITAL_COMMUNITY)
Admission: EM | Admit: 2011-09-05 | Discharge: 2011-09-05 | Disposition: A | Discharge status: Home / Self Care | Payer: Managed Care, Other (non HMO) | Attending: Emergency Medicine | Admitting: Emergency Medicine

## 2011-09-05 ENCOUNTER — Encounter (HOSPITAL_COMMUNITY): Payer: Self-pay | Admitting: *Deleted

## 2011-09-05 DIAGNOSIS — R109 Unspecified abdominal pain: Secondary | ICD-10-CM

## 2011-09-05 DIAGNOSIS — R51 Headache: Secondary | ICD-10-CM | POA: Insufficient documentation

## 2011-09-05 DIAGNOSIS — E039 Hypothyroidism, unspecified: Secondary | ICD-10-CM | POA: Insufficient documentation

## 2011-09-05 DIAGNOSIS — Z79899 Other long term (current) drug therapy: Secondary | ICD-10-CM | POA: Insufficient documentation

## 2011-09-05 DIAGNOSIS — M3130 Wegener's granulomatosis without renal involvement: Secondary | ICD-10-CM | POA: Insufficient documentation

## 2011-09-05 DIAGNOSIS — R1013 Epigastric pain: Secondary | ICD-10-CM | POA: Insufficient documentation

## 2011-09-05 DIAGNOSIS — I10 Essential (primary) hypertension: Secondary | ICD-10-CM | POA: Insufficient documentation

## 2011-09-05 LAB — URINALYSIS, ROUTINE W REFLEX MICROSCOPIC
Glucose, UA: NEGATIVE mg/dL
Ketones, ur: 15 mg/dL — AB
pH: 6 (ref 5.0–8.0)

## 2011-09-05 LAB — CBC WITH DIFFERENTIAL/PLATELET
Basophils Relative: 1 % (ref 0–1)
Eosinophils Absolute: 0.3 10*3/uL (ref 0.0–0.7)
Eosinophils Relative: 5 % (ref 0–5)
HCT: 37.5 % (ref 36.0–46.0)
Hemoglobin: 12 g/dL (ref 12.0–15.0)
MCH: 25.6 pg — ABNORMAL LOW (ref 26.0–34.0)
MCHC: 32 g/dL (ref 30.0–36.0)
MCV: 80 fL (ref 78.0–100.0)
Monocytes Absolute: 0.7 10*3/uL (ref 0.1–1.0)
Monocytes Relative: 11 % (ref 3–12)
Neutro Abs: 2.9 10*3/uL (ref 1.7–7.7)

## 2011-09-05 LAB — COMPREHENSIVE METABOLIC PANEL
Albumin: 4 g/dL (ref 3.5–5.2)
BUN: 14 mg/dL (ref 6–23)
Chloride: 101 mEq/L (ref 96–112)
Creatinine, Ser: 0.82 mg/dL (ref 0.50–1.10)
Total Bilirubin: 0.4 mg/dL (ref 0.3–1.2)

## 2011-09-05 LAB — URINE MICROSCOPIC-ADD ON

## 2011-09-05 MED ORDER — METOCLOPRAMIDE HCL 10 MG PO TABS: 10.0000 mg | ORAL_TABLET | Freq: Four times a day (QID) | ORAL | Status: DC | PRN

## 2011-09-05 MED ORDER — DIPHENHYDRAMINE HCL 50 MG/ML IJ SOLN
25.0000 mg | Freq: Once | INTRAMUSCULAR | Status: AC
  Administered 2011-09-05: 25 mg via INTRAVENOUS
  Filled 2011-09-05: qty 1

## 2011-09-05 MED ORDER — PANTOPRAZOLE SODIUM 40 MG IV SOLR
40.0000 mg | Freq: Once | INTRAVENOUS | Status: AC
  Administered 2011-09-05: 40 mg via INTRAVENOUS
  Filled 2011-09-05: qty 40

## 2011-09-05 MED ORDER — PANTOPRAZOLE SODIUM 40 MG PO TBEC: 40.0000 mg | DELAYED_RELEASE_TABLET | Freq: Every day | ORAL | Status: DC

## 2011-09-05 MED ORDER — METOCLOPRAMIDE HCL 5 MG/ML IJ SOLN
10.0000 mg | Freq: Once | INTRAMUSCULAR | Status: AC
  Administered 2011-09-05: 10 mg via INTRAVENOUS
  Filled 2011-09-05: qty 2

## 2011-09-05 MED ORDER — SODIUM CHLORIDE 0.9 % IV BOLUS (SEPSIS)
1000.0000 mL | Freq: Once | INTRAVENOUS | Status: AC
  Administered 2011-09-05: 1000 mL via INTRAVENOUS

## 2011-09-05 MED ORDER — GI COCKTAIL ~~LOC~~
30.0000 mL | Freq: Once | ORAL | Status: AC
  Administered 2011-09-05: 30 mL via ORAL
  Filled 2011-09-05: qty 30

## 2011-09-05 NOTE — ED Notes (Signed)
Pt given happy meal and sprite per RNs approval.  Pt resting comfortably with call light within reach and family at the bedside.  Pt stated she had no other requests at this time

## 2011-09-05 NOTE — ED Notes (Signed)
Patient is resting comfortably.pain improved

## 2011-09-05 NOTE — ED Provider Notes (Addendum)
History     CSN: [id number]  Arrival date & time 09/05/11  1607   First MD Initiated Contact with Patient 09/05/11 2003      Chief Complaint  Patient presents with  . Abdominal Pain    (Consider location/radiation/quality/duration/timing/severity/associated sxs/prior treatment) Patient is a 46 y.o. female presenting with abdominal pain. The history is provided by the patient.  Abdominal Pain The primary symptoms of the illness include abdominal pain.  She complains of epigastric pain in the bifrontal headache which started yesterday. Pain is moderately severe and she rates it at 8/10. Nothing makes any of the pains better nothing makes any of them worse. She's tried taking some Pepto-Bismol with no relief. She denies fever, chills, sweats. She denies nausea or, vomiting, diarrhea. Headache is dull and abdominal pain as sharp. Neither pain radiates. 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  Past Medical History  Diagnosis Date  . Mononeuritis multiplex   . Unspecified hereditary and idiopathic peripheral neuropathy   . Hearing loss   . Rash   . Joint pain   . Ischemic bowel disease   . Cyst   . Wegener's granulomatosis   . Nasal bleeding   . Hypertension   . Hypothyroidism   . Anxiety     generalized    Past Surgical History  Procedure Date  . Tubal ligation 1992    Family History  Problem Relation Age of Onset  . Diabetes Mother   . Hypertension Mother   . Breast cancer Mother   . Diabetes Sister   . Hypertension Sister     History  Substance Use Topics  . Smoking status: Former Smoker    Quit date: 05/06/2007  . Smokeless tobacco: Not on file  . Alcohol Use: No    OB History    Grav Para Term Preterm Abortions TAB SAB Ect Mult Living                  Review of Systems  Gastrointestinal: Positive for abdominal pain.  All other systems reviewed and  are negative.    Allergies  Review of patient's allergies indicates no known allergies.  Home Medications   Current Outpatient Rx  Name Route Sig Dispense Refill  . AZATHIOPRINE 50 MG PO TABS Oral Take 100 mg by mouth 2 (two) times daily.    . DULOXETINE HCL 60 MG PO CPEP Oral Take 60 mg by mouth at bedtime.     Marland Kitchen ESTRADIOL-NORETHINDRONE ACET 0.5-0.1 MG PO TABS Oral Take 1 tablet by mouth daily. 30 tablet 11  . FENTANYL 50 MCG/HR TD PT72 Transdermal Place 1 patch onto the skin every other day.    Marland Kitchen LISINOPRIL 20 MG PO TABS Oral Take 20 mg by mouth daily.    Marland Kitchen OMEPRAZOLE 20 MG PO CPDR Oral Take 20 mg by mouth Daily.     Marland Kitchen PREDNISONE 10 MG PO TABS Oral Take 10 mg by mouth daily.    Marland Kitchen ZOLPIDEM TARTRATE 10 MG PO TABS Oral Take 10 mg by mouth at bedtime as needed. For sleep      BP 132/87  Pulse 72  Temp 98.7 F (37.1 C) (Oral)  Resp 18  SpO2 99%  Physical Exam  Nursing note and vitals reviewed.  46 year old female who is resting comfortably and in no acute distress. Vital signs are normal. Oxygen saturation is 99% which is  normal. Head is normocephalic and atraumatic. PERRLA, EOMI. Fundi show no hemorrhage, exudate, or papilledema. There is no tenderness palpation over the frontalis or temporalis muscles or over the paracervical muscles. Neck is supple and nontender. Back is nontender. Lungs are clear without rales, wheezes, rhonchi. Heart has regular rate rhythm without murmur. Abdomen is soft, flat, with mild epigastric tenderness. There is no rebound or guarding. There is no hepatosplenomegaly. Extremities have full range of motion, no cyanosis or edema. Skin is warm and dry without rash. Neurologic: Mental status is normal, cranial nerves are intact, there are no motor or sensory deficits.  ED Course  Procedures (including critical care time)    1. Abdominal pain   2. Headache       MDM  Headache which may be muscle contraction headache and maybe migraine.. Nominal pain  which seems to be gastritis. She'll be given IV fluids, IV Reglan and Benadryl and a GI cocktail and reassessed. Initial laboratory workup was significant only for a high specific gravity.    After IV fluids, IV metoclopramide, IV diphenhydramine, and IV pantoprazole, and a GI cocktail, she is feeling much better. She will be sent home with prescriptions for metoclopramide and pantoprazole. Previously, she had been taking omeprazole and she is to stop taking that. She is to take antacids as needed.    Dione Booze, MD 09/05/11 2030  Dione Booze, MD 09/05/11 2240

## 2011-09-05 NOTE — ED Notes (Signed)
Temp not registering the pt is eating ice

## 2011-09-05 NOTE — ED Notes (Signed)
Updated pt on wait times. Pt comfortable at this time

## 2011-09-05 NOTE — ED Notes (Signed)
The pt has had abd pain and a headache since yesterday.  No nv or diarrhea.  lmp none

## 2011-09-09 ENCOUNTER — Encounter
Payer: Managed Care, Other (non HMO) | Attending: Physical Medicine & Rehabilitation | Admitting: Physical Medicine and Rehabilitation

## 2011-09-09 ENCOUNTER — Encounter: Payer: Self-pay | Admitting: Physical Medicine and Rehabilitation

## 2011-09-09 VITALS — BP 137/84 | HR 86 | Resp 14 | Ht 64.0 in | Wt 182.0 lb

## 2011-09-09 DIAGNOSIS — G587 Mononeuritis multiplex: Secondary | ICD-10-CM | POA: Insufficient documentation

## 2011-09-09 DIAGNOSIS — G894 Chronic pain syndrome: Secondary | ICD-10-CM

## 2011-09-09 DIAGNOSIS — M3130 Wegener's granulomatosis without renal involvement: Secondary | ICD-10-CM | POA: Insufficient documentation

## 2011-09-09 MED ORDER — FENTANYL 50 MCG/HR TD PT72: 1.0000 | MEDICATED_PATCH | TRANSDERMAL | Status: DC

## 2011-09-09 NOTE — Progress Notes (Signed)
Subjective:    Patient ID: Laura [name], female    DOB: 22-Aug-1965, 46 y.o.   MRN: [id number]  HPI [name] gives a history of Wegener granulomatosis diagnosed  in 2005, skin rash associated with this. At one point was on OxyContin  40 b.i.d. and oxycodone 5 mg t.i.d. She had EMG NCV demonstrating a  mononeuritis multiplex, which is the nerve pattern consistent with the  underlying diagnosis of Wegener granulomatosis. Looking at her  medications, she has been trialed on Opana ER 20 mg b.i.d., which was  equal analgesic to OxyContin. Her current regimen really has been  working the best. She is using Fentanyl patches q 48 hrs. Her symptoms are stable. She has tried the ice bottle massage for her feet, which gave her some relief. She reports that her neurologist has doubled her prednisone she now takes 20 mg a day.  Pain Inventory Average Pain 5 Pain Right Now 7 My pain is sharp, burning, dull and tingling  In the last 24 hours, has pain interfered with the following? General activity 7 Relation with others 7 Enjoyment of life 7 What TIME of day is your pain at its worst? all the time Sleep (in general) Fair  Pain is worse with: walking, bending, sitting, inactivity, standing and some activites Pain improves with: medication Relief from Meds: 5  Mobility walk without assistance ability to climb steps?  yes do you drive?  yes Do you have any goals in this area?  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  no  Neuro/Psych tingling depression  Prior Studies Any changes since last visit?  no  Physicians involved in your care Any changes since last visit?  no   Family History  Problem Relation Age of Onset  . Diabetes Mother   . Hypertension Mother   . Breast cancer Mother   . Diabetes Sister   . Hypertension Sister    History   Social History  . Marital Status: Single    Spouse Name: N/A   Number of Children: N/A  . Years of Education: N/A   Social History Main Topics  . Smoking status: Former Smoker    Quit date: 05/06/2007  . Smokeless tobacco: None  . Alcohol Use: No  . Drug Use: No  . Sexually Active: No   Other Topics Concern  . None   Social History Narrative  . None   Past Surgical History  Procedure Date  . Tubal ligation 1992   Past Medical History  Diagnosis Date  . Mononeuritis multiplex   . Unspecified hereditary and idiopathic peripheral neuropathy   . Hearing loss   . Rash   . Joint pain   . Ischemic bowel disease   . Cyst   . Wegener's granulomatosis   . Nasal bleeding   . Hypertension   . Hypothyroidism   . Anxiety     generalized   BP 137/84  Pulse 86  Resp 14  Ht 5\' 4"  (1.626 m)  Wt 182 lb (82.555 kg)  BMI 31.24 kg/m2  SpO2 97%     Review of Systems  Constitutional: Positive for diaphoresis.  Musculoskeletal: Positive for myalgias.  Psychiatric/Behavioral: Positive for dysphoric mood.  All other systems reviewed and are negative.       Objective:   Physical Exam Constitutional: She is oriented to person, place, and time. She appears well-developed and well-nourished.  HENT:  Head: Normocephalic.  Neck: Neck supple.  Musculoskeletal: She exhibits tenderness.  Neurological: She is alert and oriented to person, place, and time.  Skin: Skin is warm and dry.  Psychiatric: She has a normal mood and affect.   Symmetric normal motor tone is noted throughout. Normal muscle bulk. Muscle testing reveals 5/5 muscle strength of the upper extremity, and 5/5 of the lower extremity. Full range of motion in upper and lower extremities. ROM of spine is not restricted. Fine motor movements are normal in both hands.  DTR in the upper and lower extremity are present and symmetric 3+. No clonus is noted.  Patient arises from chair without difficulty. Narrow based gait with normal arm swing bilateral , able to walk on heels and toes .  Tandem walk is stable. No pronator drift. Rhomberg negative.        Assessment & Plan:  1. 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 Her examination is stable. Her pain scores are stable. We'll continue the fentanyl 50 mcg patch as well as her cymbalta.  Advised patient to perform a ice massage on the sole of her feet, with a frozen water bottle, by rolling her feet on it, or do some foot massage by rolling over a tennis, or massage ball.  Patient will follow up in a month.

## 2011-09-09 NOTE — Patient Instructions (Signed)
Continue with walking and exercising, and ice massage.

## 2011-10-08 ENCOUNTER — Encounter
Payer: Managed Care, Other (non HMO) | Attending: Physical Medicine and Rehabilitation | Admitting: Physical Medicine and Rehabilitation

## 2011-10-08 DIAGNOSIS — M3130 Wegener's granulomatosis without renal involvement: Secondary | ICD-10-CM | POA: Insufficient documentation

## 2011-10-08 DIAGNOSIS — G587 Mononeuritis multiplex: Secondary | ICD-10-CM | POA: Insufficient documentation

## 2011-10-22 ENCOUNTER — Ambulatory Visit (HOSPITAL_BASED_OUTPATIENT_CLINIC_OR_DEPARTMENT_OTHER): Payer: Managed Care, Other (non HMO) | Admitting: Physical Medicine & Rehabilitation

## 2011-10-22 ENCOUNTER — Encounter: Payer: Self-pay | Admitting: Physical Medicine & Rehabilitation

## 2011-10-22 VITALS — BP 124/80 | HR 76 | Resp 14 | Ht 64.0 in | Wt 175.8 lb

## 2011-10-22 DIAGNOSIS — G609 Hereditary and idiopathic neuropathy, unspecified: Secondary | ICD-10-CM

## 2011-10-22 DIAGNOSIS — M3130 Wegener's granulomatosis without renal involvement: Secondary | ICD-10-CM

## 2011-10-22 DIAGNOSIS — G5790 Unspecified mononeuropathy of unspecified lower limb: Secondary | ICD-10-CM

## 2011-10-22 MED ORDER — FENTANYL 50 MCG/HR TD PT72
1.0000 | MEDICATED_PATCH | TRANSDERMAL | Status: DC
Start: 2011-10-22 — End: 2011-11-21

## 2011-10-22 NOTE — Progress Notes (Signed)
Subjective:    Patient ID: Laura [name], female    DOB: 1966-02-07, 46 y.o.   MRN: [id number]  HPI [name] gives a history of Wegener granulomatosis diagnosed  in 2005, skin rash associated with this. At one point was on OxyContin  40 b.i.d. and oxycodone 5 mg t.i.d. She had EMG NCV demonstrating a  mononeuritis multiplex, which is the nerve pattern consistent with the  underlying diagnosis of Wegener granulomatosis. Looking at her  medications, she has been trialed on Opana ER 20 mg b.i.d., which was  equal analgesic to OxyContin. Her current regimen really has been  working the best. She is using Fentanyl patches q 48 hrs. Her symptoms are stable. She has tried the ice bottle massage for her feet, which gave her some relief. She reports that her neurologist has doubled her prednisone she now takes 20 mg a day.  No hand symptoms Pain Inventory Average Pain 5 Pain Right Now 7 My pain is sharp, burning, dull and tingling  In the last 24 hours, has pain interfered with the following? General activity 7 Relation with others 7 Enjoyment of life 7 What TIME of day is your pain at its worst? all the time Sleep (in general) Fair  Pain is worse with: walking, bending, sitting, inactivity, standing and some activites Pain improves with: medication Relief from Meds: 5  Mobility walk without assistance ability to climb steps?  yes do you drive?  yes Do you have any goals in this area?  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  no  Neuro/Psych tingling depression  Prior Studies Any changes since last visit?  no  Physicians involved in your care Any changes since last visit?  no   Family History  Problem Relation Age of Onset  . Diabetes Mother   . Hypertension Mother   . Breast cancer Mother   . Diabetes Sister   . Hypertension Sister    History   Social History  . Marital Status: Single    Spouse  Name: N/A    Number of Children: N/A  . Years of Education: N/A   Social History Main Topics  . Smoking status: Former Smoker    Quit date: 05/06/2007  . Smokeless tobacco: Never Used  . Alcohol Use: No  . Drug Use: No  . Sexually Active: No   Other Topics Concern  . None   Social History Narrative  . None   Past Surgical History  Procedure Date  . Tubal ligation 1992   Past Medical History  Diagnosis Date  . Mononeuritis multiplex   . Unspecified hereditary and idiopathic peripheral neuropathy   . Hearing loss   . Rash   . Joint pain   . Ischemic bowel disease   . Cyst   . Wegener's granulomatosis   . Nasal bleeding   . Hypertension   . Hypothyroidism   . Anxiety     generalized   BP 124/80  Pulse 76  Resp 14  Ht 5\' 4"  (1.626 m)  Wt 175 lb 12.8 oz (79.742 kg)  BMI 30.18 kg/m2  SpO2 98%     Review of Systems  Constitutional: Positive for diaphoresis.  Musculoskeletal: Positive for myalgias.  Psychiatric/Behavioral: Positive for dysphoric mood.  All other systems reviewed and are negative.       Objective:   Physical Exam Constitutional: She is oriented to person, place, and time. She appears  well-developed and well-nourished.  HENT:  Head: Normocephalic.  Neck: Neck supple.  Musculoskeletal: She exhibits tenderness.  Neurological: She is alert and oriented to person, place, and time.  Skin: Skin is warm and dry.  Psychiatric: She has a normal mood and affect.   Symmetric normal motor tone is noted throughout. Normal muscle bulk. Muscle testing reveals 5/5 muscle strength of the upper extremity, and 5/5 of the lower extremity. Full range of motion in upper and lower extremities. ROM of spine is not restricted. Fine motor movements are normal in both hands.  DTR in the upper and lower extremity are present and symmetric 3+. No clonus is noted.  Patient arises from chair without difficulty. Narrow based gait with normal arm swing bilateral , able to  walk on heels and toes . Tandem walk is stable. No pronator drift. Rhomberg negative.        Assessment & Plan:  1. 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 Her examination is stable. Her pain scores are stable. We'll continue the fentanyl 50 mcg patch Q48 hr  Patient will follow up in a month.

## 2011-10-22 NOTE — Patient Instructions (Signed)
Neuropathy Neuropathy means your peripheral nerves are not working normally. Peripheral nerves are the nerves outside the brain and spinal cord. Messages between the brain and the rest of the body do not work properly with peripheral nerve disorders. CAUSES There are many different causes of peripheral nerve disorders. These include:  Injury.   Infections.   Diabetes.   Vitamin deficiency.   Poor circulation.   Alcoholism.   Exposure to toxins.   Drug effects.   Tumors.   Kidney disease.  SYMPTOMS  Tingling, burning, pain, and numbness in the extremities.   Weakness and loss of muscle tone and size.  DIAGNOSIS Blood tests and special studies of nerve function may help confirm the diagnosis.  TREATMENT  Treatment includes adopting healthy life habits.   A good diet, vitamin supplements, and mild pain medicine may be needed.   Avoid known toxins such as alcohol, tobacco, and recreational drugs.   Anti-convulsant medicines are helpful in some types of neuropathy.  Make a follow-up appointment with your caregiver to be sure you are getting better with treatment.  SEEK IMMEDIATE MEDICAL CARE IF:   You have breathing problems.   You have severe or uncontrolled pain.   You notice extreme weakness or you feel faint.   You are not better after 1 week or if you have worse symptoms.  Document Released: 03/21/2004 Document Revised: 10/24/2010 Document Reviewed: 02/11/2005 [employer] Patient Information 2012 [employer]. 

## 2011-10-22 NOTE — Progress Notes (Signed)
  Subjective:    Patient ID: Laura [name], female    DOB: 15-Aug-1965, 46 y.o.   MRN: [id number]  HPI  Pain Inventory Average Pain 5 Pain Right Now 5 My pain is constant, burning, stabbing and tingling  In the last 24 hours, has pain interfered with the following? General activity 4 Relation with others 4 Enjoyment of life 4 What TIME of day is your pain at its worst? all of the time Sleep (in general) Fair  Pain is worse with: walking, bending, sitting, inactivity and standing Pain improves with: medication Relief from Meds: 5  Mobility walk without assistance  Function employed # of hrs/week 40  Neuro/Psych No problems in this area  Prior Studies Any changes since last visit?  no  Physicians involved in your care Any changes since last visit?  no   Family History  Problem Relation Age of Onset  . Diabetes Mother   . Hypertension Mother   . Breast cancer Mother   . Diabetes Sister   . Hypertension Sister    History   Social History  . Marital Status: Single    Spouse Name: N/A    Number of Children: N/A  . Years of Education: N/A   Social History Main Topics  . Smoking status: Former Smoker    Quit date: 05/06/2007  . Smokeless tobacco: Never Used  . Alcohol Use: No  . Drug Use: No  . Sexually Active: No   Other Topics Concern  . None   Social History Narrative  . None   Past Surgical History  Procedure Date  . Tubal ligation 1992   Past Medical History  Diagnosis Date  . Mononeuritis multiplex   . Unspecified hereditary and idiopathic peripheral neuropathy   . Hearing loss   . Rash   . Joint pain   . Ischemic bowel disease   . Cyst   . Wegener's granulomatosis   . Nasal bleeding   . Hypertension   . Hypothyroidism   . Anxiety     generalized   BP 124/80  Pulse 76  Resp 14  Ht 5\' 4"  (1.626 m)  Wt 175 lb 12.8 oz (79.742 kg)  BMI 30.18 kg/m2  SpO2 98%   Review of Systems  Constitutional: Positive for diaphoresis.    All other systems reviewed and are negative.       Objective:   Physical Exam        Assessment & Plan:

## 2011-11-21 ENCOUNTER — Encounter
Payer: Managed Care, Other (non HMO) | Attending: Physical Medicine and Rehabilitation | Admitting: Physical Medicine and Rehabilitation

## 2011-11-21 ENCOUNTER — Encounter: Payer: Self-pay | Admitting: Physical Medicine and Rehabilitation

## 2011-11-21 VITALS — BP 132/72 | HR 74 | Resp 14 | Ht 64.0 in | Wt 178.0 lb

## 2011-11-21 DIAGNOSIS — G587 Mononeuritis multiplex: Secondary | ICD-10-CM | POA: Insufficient documentation

## 2011-11-21 DIAGNOSIS — G609 Hereditary and idiopathic neuropathy, unspecified: Secondary | ICD-10-CM

## 2011-11-21 DIAGNOSIS — M3130 Wegener's granulomatosis without renal involvement: Secondary | ICD-10-CM | POA: Insufficient documentation

## 2011-11-21 MED ORDER — FENTANYL 50 MCG/HR TD PT72: 1.0000 | MEDICATED_PATCH | TRANSDERMAL | Status: DC

## 2011-11-21 NOTE — Progress Notes (Signed)
Subjective:    Patient ID: Laura [name], female    DOB: [birth year], 46 y.o.   MRN: [id number]  HPI [name] gives a history of Wegener granulomatosis diagnosed  in 2005, skin rash associated with this. At one point was on OxyContin  40 b.i.d. and oxycodone 5 mg t.i.d. She had EMG NCV demonstrating a  mononeuritis multiplex, which is the nerve pattern consistent with the  underlying diagnosis of Wegener granulomatosis. Looking at her  medications, she has been trialed on Opana ER 20 mg b.i.d., which was  equal analgesic to OxyContin. Her current regimen really has been  working the best. She is using Fentanyl patches q 48 hrs. She has tried the ice bottle massage for her feet, which gave her some relief. She reports that her neurologist has decreased her prednisone back to 10 mg a day. She states, that she can not afford the Cymbalta anymore, and is now taking Efexor for her depression. Since she stopped the Cymbalta her pain has gotten worse.  Pain Inventory Average Pain 5 Pain Right Now 5 My pain is sharp, burning, stabbing and tingling  In the last 24 hours, has pain interfered with the following? General activity 5 Relation with others 5 Enjoyment of life 5 What TIME of day is your pain at its worst? all the time Sleep (in general) Fair  Pain is worse with: walking, bending, sitting, inactivity and standing Pain improves with: medication Relief from Meds: 6  Mobility walk without assistance how many minutes can you walk? 60 ability to climb steps?  yes do you drive?  yes Do you have any goals in this area?  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  no  Neuro/Psych No problems in this area  Prior Studies Any changes since last visit?  no  Physicians involved in your care Any changes since last visit?  no   Family History  Problem Relation Age of Onset  . Diabetes Mother   . Hypertension Mother     . Breast cancer Mother   . Diabetes Sister   . Hypertension Sister    History   Social History  . Marital Status: Single    Spouse Name: N/A    Number of Children: N/A  . Years of Education: N/A   Social History Main Topics  . Smoking status: Former Smoker    Quit date: 05/06/2007  . Smokeless tobacco: Never Used  . Alcohol Use: No  . Drug Use: No  . Sexually Active: No   Other Topics Concern  . None   Social History Narrative  . None   Past Surgical History  Procedure Date  . Tubal ligation 1992   Past Medical History  Diagnosis Date  . Mononeuritis multiplex   . Unspecified hereditary and idiopathic peripheral neuropathy   . Hearing loss   . Rash   . Joint pain   . Ischemic bowel disease   . Cyst   . Wegener's granulomatosis   . Nasal bleeding   . Hypertension   . Hypothyroidism   . Anxiety     generalized   BP 132/72  Pulse 74  Resp 14  Ht 5\' 4"  (1.626 m)  Wt 178 lb (80.74 kg)  BMI 30.55 kg/m2  SpO2 97%     Review of Systems  Musculoskeletal: Positive for myalgias and arthralgias.  All other systems reviewed and are negative.  Objective:   Physical Exam  Constitutional: She is oriented to person, place, and time. She appears well-developed and well-nourished.  HENT:  Head: Normocephalic.  Neck: Neck supple.  Musculoskeletal: She exhibits tenderness.  Neurological: She is alert and oriented to person, place, and time.  Skin: Skin is warm and dry.  Psychiatric: She has a normal mood and affect.  Symmetric normal motor tone is noted throughout. Normal muscle bulk. Muscle testing reveals 5/5 muscle strength of the upper extremity, and 5/5 of the lower extremity. Full range of motion in upper and lower extremities. ROM of spine is not restricted. Fine motor movements are normal in both hands.  DTR in the upper and lower extremity are present and symmetric 3+. No clonus is noted.  Patient arises from chair without difficulty. Narrow  based gait with normal arm swing bilateral , able to walk on heels and toes . Tandem walk is stable. No pronator drift. Rhomberg negative.       Assessment & Plan:  1. 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 Her examination is stable. Her pain scores are stable. We'll continue the fentanyl 50 mcg patch. Patient has d/c  Cymbalta, because of financial reasons.  Advised patient to continue the ice massage on the sole of her feet, with a frozen water bottle, by rolling her feet on it, or do some foot massage by rolling over a tennis, or massage ball. Also ordered PT with modalities for pain relief. Patient will follow up in a month, with Dr. Doroteo Bradford to discuss changing her medication, if the PT does not give her relief.

## 2011-11-21 NOTE — Patient Instructions (Signed)
Continue staying active, continue with massaging your feet

## 2011-12-17 ENCOUNTER — Encounter: Payer: Self-pay | Admitting: Physical Medicine & Rehabilitation

## 2011-12-17 ENCOUNTER — Encounter: Payer: Managed Care, Other (non HMO) | Attending: Physical Medicine and Rehabilitation

## 2011-12-17 ENCOUNTER — Ambulatory Visit (HOSPITAL_BASED_OUTPATIENT_CLINIC_OR_DEPARTMENT_OTHER): Payer: Managed Care, Other (non HMO) | Admitting: Physical Medicine & Rehabilitation

## 2011-12-17 VITALS — BP 117/73 | HR 70 | Resp 14 | Ht 64.0 in | Wt 179.0 lb

## 2011-12-17 DIAGNOSIS — G609 Hereditary and idiopathic neuropathy, unspecified: Secondary | ICD-10-CM

## 2011-12-17 DIAGNOSIS — G587 Mononeuritis multiplex: Secondary | ICD-10-CM | POA: Insufficient documentation

## 2011-12-17 DIAGNOSIS — M79673 Pain in unspecified foot: Secondary | ICD-10-CM

## 2011-12-17 DIAGNOSIS — M79609 Pain in unspecified limb: Secondary | ICD-10-CM

## 2011-12-17 DIAGNOSIS — Z5181 Encounter for therapeutic drug level monitoring: Secondary | ICD-10-CM

## 2011-12-17 DIAGNOSIS — M3130 Wegener's granulomatosis without renal involvement: Secondary | ICD-10-CM | POA: Insufficient documentation

## 2011-12-17 MED ORDER — VENLAFAXINE HCL 75 MG PO TABS: 75.0000 mg | ORAL_TABLET | Freq: Two times a day (BID) | ORAL | Status: DC

## 2011-12-17 MED ORDER — FENTANYL 50 MCG/HR TD PT72: 1.0000 | MEDICATED_PATCH | TRANSDERMAL | Status: DC

## 2011-12-17 NOTE — Progress Notes (Signed)
Subjective:    Patient ID: Laura [name], female    DOB: 1966-01-04, 46 y.o.   MRN: [id number] [name] gives a history of Wegener granulomatosis diagnosed  in 2005, skin rash associated with this. At one point was on OxyContin  40 b.i.d. and oxycodone 5 mg t.i.d. She had EMG NCV demonstrating a  mononeuritis multiplex, which is the nerve pattern consistent with the  underlying diagnosis of Wegener granulomatosis. Looking at her  medications, she has been trialed on Opana ER 20 mg b.i.d., which was  equal analgesic to OxyContin. Her current regimen really has been  working the best. She is using Fentanyl patches q 48 hrs. Her symptoms are stable. She has tried the ice bottle massage for her feet, which gave her some relief. She reports that her neurologist has doubled her prednisone she now takes 20 mg a day. HPI Had good relief with Cymbalta but stopped this due to cost Pain Inventory Average Pain 5 Pain Right Now 4 My pain is intermittent  In the last 24 hours, has pain interfered with the following? General activity 4 Relation with others 4 Enjoyment of life 4 What TIME of day is your pain at its worst? all the time Sleep (in general) Fair  Pain is worse with: walking, bending, sitting, inactivity and standing Pain improves with: medication Relief from Meds: 5  Mobility walk without assistance how many minutes can you walk? 15-20 ability to climb steps?  yes do you drive?  yes Do you have any goals in this area?  no  Function employed # of hrs/week 40 Do you have any goals in this area?  no  Neuro/Psych No problems in this area  Prior Studies Any changes since last visit?  no  Physicians involved in your care Any changes since last visit?  no   Family History  Problem Relation Age of Onset  . Diabetes Mother   . Hypertension Mother   . Breast cancer Mother   . Diabetes Sister   . Hypertension Sister    History   Social History  .  Marital Status: Single    Spouse Name: N/A    Number of Children: N/A  . Years of Education: N/A   Social History Main Topics  . Smoking status: Former Smoker    Quit date: 05/06/2007  . Smokeless tobacco: Never Used  . Alcohol Use: No  . Drug Use: No  . Sexually Active: No   Other Topics Concern  . None   Social History Narrative  . None   Past Surgical History  Procedure Date  . Tubal ligation 1992   Past Medical History  Diagnosis Date  . Mononeuritis multiplex   . Unspecified hereditary and idiopathic peripheral neuropathy   . Hearing loss   . Rash   . Joint pain   . Ischemic bowel disease   . Cyst   . Wegener's granulomatosis   . Nasal bleeding   . Hypertension   . Hypothyroidism   . Anxiety     generalized   BP 117/73  Pulse 70  Resp 14  Ht 5\' 4"  (1.626 m)  Wt 179 lb (81.194 kg)  BMI 30.73 kg/m2  SpO2 95%    Review of Systems     Objective:   Physical Exam  Constitutional: She is oriented to person, place, and time. She appears well-developed.       obese  Neurological: She is alert and oriented to person, place, and time.  She displays no atrophy. No sensory deficit. Coordination and gait normal.  Reflex Scores:      Patellar reflexes are 2+ on the right side and 2+ on the left side.      Achilles reflexes are 1+ on the right side and 1+ on the left side. Psychiatric: Her mood appears anxious.          Assessment & Plan:  Painful neuropathy mononeuropathy multiplex related to Wegener's granulomatosis Continued narcotic analgesic medications. No signs of aberrant drug behavior. Monitor with urine drug screen. Had good pain relief with Cymbalta but discontinued this because of cost. Venlafaxine at low dose does not seem to be helping. Will increase to 75 mg twice a day Return to clinic one month

## 2011-12-17 NOTE — Patient Instructions (Signed)
We have increased her venlafaxine or Effexor to 75 mg twice a day Continue the current dose of Duragesic patch 50 mcg a day

## 2011-12-20 ENCOUNTER — Telehealth: Payer: Self-pay

## 2011-12-20 NOTE — Telephone Encounter (Signed)
Patient took some vicodin that she had left over from the dentist.

## 2012-01-13 ENCOUNTER — Ambulatory Visit: Payer: Managed Care, Other (non HMO) | Admitting: Physical Medicine & Rehabilitation

## 2012-01-13 ENCOUNTER — Encounter: Payer: Managed Care, Other (non HMO) | Attending: Physical Medicine and Rehabilitation

## 2012-01-13 DIAGNOSIS — M3130 Wegener's granulomatosis without renal involvement: Secondary | ICD-10-CM | POA: Insufficient documentation

## 2012-01-13 DIAGNOSIS — G587 Mononeuritis multiplex: Secondary | ICD-10-CM | POA: Insufficient documentation

## 2012-01-15 ENCOUNTER — Telehealth: Payer: Self-pay

## 2012-01-15 NOTE — Telephone Encounter (Signed)
Does not look like there is a DDS in the list.  Due to inconsistent UDS, pt will be discharged.  Lm advising patient to call office.

## 2012-01-15 NOTE — Telephone Encounter (Signed)
Message copied by Judd Gaudier on Wed Jan 15, 2012 11:22 AM ------      Message from: Erick Colace      Created: Wed Jan 15, 2012  8:54 AM       Do [place] [facility] check if recent Rx from DDS then educate       If no Rx then D/C for violation of CSA

## 2012-01-15 NOTE — Telephone Encounter (Signed)
Patient aware of discharge, please send letter with other facilities.

## 2012-01-15 NOTE — Progress Notes (Signed)
Please do [facility] check to see if pt has recent hydrocodone Rx from [facility].  If not D/C for violation of CSA

## 2012-01-16 ENCOUNTER — Encounter: Payer: Self-pay | Admitting: Physical Medicine & Rehabilitation

## 2012-01-16 NOTE — Telephone Encounter (Signed)
Done

## 2012-01-17 ENCOUNTER — Telehealth: Payer: Self-pay | Admitting: Physical Medicine and Rehabilitation

## 2012-01-17 NOTE — Telephone Encounter (Signed)
Just looked at [facility] data, patient had a prescription for hydrocodone from May, but clinical staff already send her a d/c note,

## 2012-01-17 NOTE — Telephone Encounter (Signed)
Message copied by Su Monks on Fri Jan 17, 2012  8:50 AM ------      Message from: Erick Colace      Created: Wed Jan 15, 2012  8:54 AM       Do [place] [facility] check if recent Rx from [facility]DS then educate       If no Rx then D/C for violation of CSA

## 2012-01-17 NOTE — Telephone Encounter (Signed)
ok 

## 2012-02-03 ENCOUNTER — Encounter (HOSPITAL_COMMUNITY): Payer: Self-pay | Admitting: Emergency Medicine

## 2012-02-03 ENCOUNTER — Emergency Department (HOSPITAL_COMMUNITY)
Admission: EM | Admit: 2012-02-03 | Discharge: 2012-02-03 | Disposition: A | Discharge status: Home / Self Care | Payer: Managed Care, Other (non HMO) | Attending: Emergency Medicine | Admitting: Emergency Medicine

## 2012-02-03 DIAGNOSIS — G587 Mononeuritis multiplex: Secondary | ICD-10-CM | POA: Insufficient documentation

## 2012-02-03 DIAGNOSIS — E039 Hypothyroidism, unspecified: Secondary | ICD-10-CM | POA: Insufficient documentation

## 2012-02-03 DIAGNOSIS — K2970 Gastritis, unspecified, without bleeding: Secondary | ICD-10-CM | POA: Insufficient documentation

## 2012-02-03 DIAGNOSIS — G894 Chronic pain syndrome: Secondary | ICD-10-CM

## 2012-02-03 DIAGNOSIS — Z3202 Encounter for pregnancy test, result negative: Secondary | ICD-10-CM | POA: Insufficient documentation

## 2012-02-03 DIAGNOSIS — G609 Hereditary and idiopathic neuropathy, unspecified: Secondary | ICD-10-CM | POA: Insufficient documentation

## 2012-02-03 DIAGNOSIS — R109 Unspecified abdominal pain: Secondary | ICD-10-CM

## 2012-02-03 DIAGNOSIS — H9190 Unspecified hearing loss, unspecified ear: Secondary | ICD-10-CM | POA: Insufficient documentation

## 2012-02-03 DIAGNOSIS — Z87891 Personal history of nicotine dependence: Secondary | ICD-10-CM | POA: Insufficient documentation

## 2012-02-03 DIAGNOSIS — Z79899 Other long term (current) drug therapy: Secondary | ICD-10-CM | POA: Insufficient documentation

## 2012-02-03 DIAGNOSIS — R1013 Epigastric pain: Secondary | ICD-10-CM | POA: Insufficient documentation

## 2012-02-03 DIAGNOSIS — F419 Anxiety disorder, unspecified: Secondary | ICD-10-CM

## 2012-02-03 DIAGNOSIS — I10 Essential (primary) hypertension: Secondary | ICD-10-CM | POA: Insufficient documentation

## 2012-02-03 DIAGNOSIS — R110 Nausea: Secondary | ICD-10-CM | POA: Insufficient documentation

## 2012-02-03 DIAGNOSIS — G8929 Other chronic pain: Secondary | ICD-10-CM | POA: Insufficient documentation

## 2012-02-03 DIAGNOSIS — Z872 Personal history of diseases of the skin and subcutaneous tissue: Secondary | ICD-10-CM | POA: Insufficient documentation

## 2012-02-03 DIAGNOSIS — R42 Dizziness and giddiness: Secondary | ICD-10-CM | POA: Insufficient documentation

## 2012-02-03 DIAGNOSIS — M3130 Wegener's granulomatosis without renal involvement: Secondary | ICD-10-CM | POA: Insufficient documentation

## 2012-02-03 DIAGNOSIS — F411 Generalized anxiety disorder: Secondary | ICD-10-CM | POA: Insufficient documentation

## 2012-02-03 DIAGNOSIS — R040 Epistaxis: Secondary | ICD-10-CM | POA: Insufficient documentation

## 2012-02-03 DIAGNOSIS — Z8719 Personal history of other diseases of the digestive system: Secondary | ICD-10-CM | POA: Insufficient documentation

## 2012-02-03 LAB — URINALYSIS, ROUTINE W REFLEX MICROSCOPIC
Leukocytes, UA: NEGATIVE
Nitrite: NEGATIVE
Specific Gravity, Urine: 1.042 — ABNORMAL HIGH (ref 1.005–1.030)
pH: 6.5 (ref 5.0–8.0)

## 2012-02-03 LAB — COMPREHENSIVE METABOLIC PANEL
Albumin: 4 g/dL (ref 3.5–5.2)
BUN: 16 mg/dL (ref 6–23)
Creatinine, Ser: 0.69 mg/dL (ref 0.50–1.10)
Potassium: 4.5 mEq/L (ref 3.5–5.1)
Total Protein: 8.5 g/dL — ABNORMAL HIGH (ref 6.0–8.3)

## 2012-02-03 LAB — CBC WITH DIFFERENTIAL/PLATELET
Basophils Relative: 0 % (ref 0–1)
Eosinophils Absolute: 0 10*3/uL (ref 0.0–0.7)
Hemoglobin: 13.5 g/dL (ref 12.0–15.0)
MCH: 25.5 pg — ABNORMAL LOW (ref 26.0–34.0)
MCHC: 32.5 g/dL (ref 30.0–36.0)
Monocytes Absolute: 0.3 10*3/uL (ref 0.1–1.0)
Monocytes Relative: 2 % — ABNORMAL LOW (ref 3–12)
Neutrophils Relative %: 92 % — ABNORMAL HIGH (ref 43–77)

## 2012-02-03 LAB — URINE MICROSCOPIC-ADD ON

## 2012-02-03 LAB — LIPASE, BLOOD: Lipase: 35 U/L (ref 11–59)

## 2012-02-03 MED ORDER — SODIUM CHLORIDE 0.9 % IV BOLUS (SEPSIS)
1000.0000 mL | Freq: Once | INTRAVENOUS | Status: AC
  Administered 2012-02-03: 1000 mL via INTRAVENOUS

## 2012-02-03 MED ORDER — METOCLOPRAMIDE HCL 5 MG/ML IJ SOLN
10.0000 mg | INTRAMUSCULAR | Status: AC
  Administered 2012-02-03: 10 mg via INTRAVENOUS
  Filled 2012-02-03: qty 2

## 2012-02-03 MED ORDER — ONDANSETRON HCL 4 MG/2ML IJ SOLN
4.0000 mg | Freq: Once | INTRAMUSCULAR | Status: AC
  Administered 2012-02-03: 4 mg via INTRAVENOUS
  Filled 2012-02-03: qty 2

## 2012-02-03 MED ORDER — DIPHENHYDRAMINE HCL 50 MG/ML IJ SOLN
25.0000 mg | Freq: Once | INTRAMUSCULAR | Status: AC
  Administered 2012-02-03: 25 mg via INTRAVENOUS
  Filled 2012-02-03: qty 1

## 2012-02-03 MED ORDER — PANTOPRAZOLE SODIUM 40 MG PO TBEC
40.0000 mg | DELAYED_RELEASE_TABLET | Freq: Once | ORAL | Status: AC
  Administered 2012-02-03: 40 mg via ORAL
  Filled 2012-02-03: qty 1

## 2012-02-03 MED ORDER — GI COCKTAIL ~~LOC~~
30.0000 mL | Freq: Once | ORAL | Status: AC
  Administered 2012-02-03: 30 mL via ORAL
  Filled 2012-02-03: qty 30

## 2012-02-03 MED ORDER — OMEPRAZOLE 20 MG PO CPDR: 20.0000 mg | DELAYED_RELEASE_CAPSULE | Freq: Every day | ORAL | Status: DC

## 2012-02-03 MED ORDER — MORPHINE SULFATE 4 MG/ML IJ SOLN
4.0000 mg | Freq: Once | INTRAMUSCULAR | Status: AC
  Administered 2012-02-03: 4 mg via INTRAVENOUS
  Filled 2012-02-03: qty 1

## 2012-02-03 MED ORDER — ONDANSETRON HCL 4 MG PO TABS: 4.0000 mg | ORAL_TABLET | Freq: Three times a day (TID) | ORAL | Status: DC | PRN

## 2012-02-03 NOTE — ED Notes (Signed)
Pt c/o mid upper abd pain and HA x 2 days

## 2012-02-03 NOTE — ED Provider Notes (Signed)
History     CSN: [id number]  Arrival date & time 02/03/12  1308   First MD Initiated Contact with Patient 02/03/12 1053      Chief Complaint  Patient presents with  . Headache  . Abdominal Pain    (Consider location/radiation/quality/duration/timing/severity/associated sxs/prior treatment) The history is provided by the patient and medical records.    Laura [name] is a 46 y.o. female  with a hx of Wegener's granulomatosis presents to the Emergency Department complaining of gradual, persistent, progressively worsening epigastric pain onset 2 days ago. Associated symptoms include headache, small episode of epistaxis, lightheaded.  Nothing makes it better and nothing makes it worse. Pt has taken Tums, Pepto bismol, and Prilosec without relief.  Pt denies fever, chest pain, shortness of breath, nausea, vomiting, diarrhea, blood in stool, changes in vision, photophobia, weakness near-syncope.  Pt described the pain as sharp, non radiating, rated at a 7/10, located in the epigastric region.  Pain is unchanged by food and not linked to mealtime. Pt has been able to eat which doesn't change her symptoms.  She has a history of Wegener's granulomatosis which has affected her GI tract in the past and was concerned that this might be the problem today.   Past Medical History  Diagnosis Date  . Mononeuritis multiplex   . Unspecified hereditary and idiopathic peripheral neuropathy   . Hearing loss   . Rash   . Joint pain   . Ischemic bowel disease   . Cyst   . Wegener's granulomatosis   . Nasal bleeding   . Hypertension   . Hypothyroidism   . Anxiety     generalized    Past Surgical History  Procedure Date  . Tubal ligation 1992    Family History  Problem Relation Age of Onset  . Diabetes Mother   . Hypertension Mother   . Breast cancer Mother   . Diabetes Sister   . Hypertension Sister     History  Substance Use Topics  . Smoking status: Former Smoker    Quit date:  05/06/2007  . Smokeless tobacco: Never Used  . Alcohol Use: No    OB History    Grav Para Term Preterm Abortions TAB SAB Ect Mult Living                  Review of Systems  Constitutional: Negative for fever, diaphoresis, appetite change, fatigue and unexpected weight change.  HENT: Negative for mouth sores, trouble swallowing, neck pain and neck stiffness.   Respiratory: Negative for cough, chest tightness, shortness of breath, wheezing and stridor.   Cardiovascular: Negative for chest pain and palpitations.  Gastrointestinal: Positive for nausea and abdominal pain. Negative for vomiting, diarrhea, constipation, blood in stool, abdominal distention and rectal pain.  Genitourinary: Negative for dysuria, urgency, frequency, hematuria, flank pain and difficulty urinating.  Musculoskeletal: Negative for back pain.  Skin: Negative for rash.  Neurological: Positive for headaches. Negative for weakness.  Hematological: Negative for adenopathy.  Psychiatric/Behavioral: Negative for confusion.  All other systems reviewed and are negative.    Allergies  Review of patient's allergies indicates no known allergies.  Home Medications   Current Outpatient Rx  Name  Route  Sig  Dispense  Refill  . AZATHIOPRINE 50 MG PO TABS   Oral   Take 100 mg by mouth 2 (two) times daily.         Marland Kitchen ESTRADIOL-NORETHINDRONE ACET 0.5-0.1 MG PO TABS   Oral   Take 1 tablet  by mouth daily.   30 tablet   11   . LISINOPRIL 20 MG PO TABS   Oral   Take 20 mg by mouth daily.         Marland Kitchen PREDNISONE 20 MG PO TABS   Oral   Take 40 mg by mouth 2 (two) times daily.         . VENLAFAXINE HCL 75 MG PO TABS   Oral   Take 1 tablet (75 mg total) by mouth 2 (two) times daily.   60 tablet   1   . ZOLPIDEM TARTRATE 10 MG PO TABS   Oral   Take 10 mg by mouth at bedtime as needed. For sleep         . OMEPRAZOLE 20 MG PO CPDR   Oral   Take 1 capsule (20 mg total) by mouth daily.   30 capsule   0   .  ONDANSETRON HCL 4 MG PO TABS   Oral   Take 1 tablet (4 mg total) by mouth every 8 (eight) hours as needed for nausea.   20 tablet   0     BP 124/72  Pulse 98  Temp 97.6 F (36.4 C) (Oral)  Resp 16  SpO2 100%  Physical Exam  Nursing note and vitals reviewed. Constitutional: She appears well-developed and well-nourished. No distress.  HENT:  Head: Normocephalic and atraumatic.  Mouth/Throat: Oropharynx is clear and moist. No oropharyngeal exudate.  Eyes: Conjunctivae normal and EOM are normal. Pupils are equal, round, and reactive to light. No scleral icterus.  Neck: Normal range of motion. Neck supple.  Cardiovascular: Regular rhythm, S1 normal, S2 normal, normal heart sounds and intact distal pulses.  Tachycardia present.  Exam reveals no gallop and no friction rub.   No murmur heard. Pulses:      Radial pulses are 2+ on the right side, and 2+ on the left side.       Dorsalis pedis pulses are 2+ on the right side, and 2+ on the left side.       Posterior tibial pulses are 2+ on the right side, and 2+ on the left side.  Pulmonary/Chest: Effort normal and breath sounds normal. No respiratory distress. She has no decreased breath sounds. She has no wheezes. She has no rhonchi. She has no rales.  Abdominal: Soft. Bowel sounds are normal. She exhibits no distension and no mass. There is tenderness (mild) in the epigastric area. There is no rigidity, no rebound, no guarding, no CVA tenderness, no tenderness at McBurney's point and negative Murphy's sign.         No peritoneal signs, no rebound tenderness  Musculoskeletal: Normal range of motion. She exhibits no edema.  Lymphadenopathy:    She has no cervical adenopathy.  Neurological: She is alert. GCS eye subscore is 4. GCS verbal subscore is 5. GCS motor subscore is 6.       Speech is clear and goal oriented Moves extremities without ataxia  Skin: Skin is warm and dry. No rash noted. She is not diaphoretic.  Psychiatric: Her mood  appears anxious.       Pt anxious and crying on exam    ED Course  Procedures (including critical care time)  Labs Reviewed  CBC WITH DIFFERENTIAL - Abnormal; Notable for the following:    WBC 15.1 (*)     RBC 5.30 (*)     MCH 25.5 (*)     RDW 16.1 (*)  Platelets 469 (*)     Neutrophils Relative 92 (*)     Neutro Abs 13.9 (*)     Lymphocytes Relative 6 (*)     Monocytes Relative 2 (*)     All other components within normal limits  COMPREHENSIVE METABOLIC PANEL - Abnormal; Notable for the following:    Glucose, Bld 130 (*)     Total Protein 8.5 (*)     Alkaline Phosphatase 118 (*)     All other components within normal limits  URINALYSIS, ROUTINE W REFLEX MICROSCOPIC - Abnormal; Notable for the following:    Specific Gravity, Urine 1.042 (*)     Ketones, ur 15 (*)     Protein, ur 100 (*)     All other components within normal limits  URINE MICROSCOPIC-ADD ON - Abnormal; Notable for the following:    Squamous Epithelial / LPF FEW (*)     All other components within normal limits  LIPASE, BLOOD  POCT PREGNANCY, URINE    Results for orders placed during the hospital encounter of 02/03/12  CBC WITH DIFFERENTIAL      Component Value Range   WBC 15.1 (*) 4.0 - 10.5 K/uL   RBC 5.30 (*) 3.87 - 5.11 MIL/uL   Hemoglobin 13.5  12.0 - 15.0 g/dL   HCT 30.8  65.7 - 84.6 %   MCV 78.3  78.0 - 100.0 fL   MCH 25.5 (*) 26.0 - 34.0 pg   MCHC 32.5  30.0 - 36.0 g/dL   RDW 96.2 (*) 95.2 - 84.1 %   Platelets 469 (*) 150 - 400 K/uL   Neutrophils Relative 92 (*) 43 - 77 %   Neutro Abs 13.9 (*) 1.7 - 7.7 K/uL   Lymphocytes Relative 6 (*) 12 - 46 %   Lymphs Abs 0.9  0.7 - 4.0 K/uL   Monocytes Relative 2 (*) 3 - 12 %   Monocytes Absolute 0.3  0.1 - 1.0 K/uL   Eosinophils Relative 0  0 - 5 %   Eosinophils Absolute 0.0  0.0 - 0.7 K/uL   Basophils Relative 0  0 - 1 %   Basophils Absolute 0.0  0.0 - 0.1 K/uL  COMPREHENSIVE METABOLIC PANEL      Component Value Range   Sodium 135  135 -  145 mEq/L   Potassium 4.5  3.5 - 5.1 mEq/L   Chloride 99  96 - 112 mEq/L   CO2 24  19 - 32 mEq/L   Glucose, Bld 130 (*) 70 - 99 mg/dL   BUN 16  6 - 23 mg/dL   Creatinine, Ser 3.24  0.50 - 1.10 mg/dL   Calcium 40.1  8.4 - 02.7 mg/dL   Total Protein 8.5 (*) 6.0 - 8.3 g/dL   Albumin 4.0  3.5 - 5.2 g/dL   AST 27  0 - 37 U/L   ALT 22  0 - 35 U/L   Alkaline Phosphatase 118 (*) 39 - 117 U/L   Total Bilirubin 0.3  0.3 - 1.2 mg/dL   GFR calc non Af Amer >90  >90 mL/min   GFR calc Af Amer >90  >90 mL/min  LIPASE, BLOOD      Component Value Range   Lipase 35  11 - 59 U/L  URINALYSIS, ROUTINE W REFLEX MICROSCOPIC      Component Value Range   Color, Urine YELLOW  YELLOW   APPearance CLEAR  CLEAR   Specific Gravity, Urine 1.042 (*) 1.005 - 1.030  pH 6.5  5.0 - 8.0   Glucose, UA NEGATIVE  NEGATIVE mg/dL   Hgb urine dipstick NEGATIVE  NEGATIVE   Bilirubin Urine NEGATIVE  NEGATIVE   Ketones, ur 15 (*) NEGATIVE mg/dL   Protein, ur 454 (*) NEGATIVE mg/dL   Urobilinogen, UA 1.0  0.0 - 1.0 mg/dL   Nitrite NEGATIVE  NEGATIVE   Leukocytes, UA NEGATIVE  NEGATIVE  POCT PREGNANCY, URINE      Component Value Range   Preg Test, Ur NEGATIVE  NEGATIVE  URINE MICROSCOPIC-ADD ON      Component Value Range   Squamous Epithelial / LPF FEW (*) RARE   WBC, UA 0-2  <3 WBC/hpf   RBC / HPF 0-2  <3 RBC/hpf   Bacteria, UA RARE  RARE   No results found.   No results found.   1. Gastritis   2. Anxiety   3. Wegener's granulomatosis   4. Chronic pain syndrome   5. Abdominal pain       MDM  Geanie Kenning presents with abdominal pain and concern about Wegner's flare.  Pt with symptoms of gastritis and significant improvement with pain medication, fluids and antiemetics.  Pt also given GI cocktail with good improvement. This is likely gastritis.  On reevaluation patient is no longer anxious or crying. She states that her abdominal pain and her headaches have resolved. She feels much better and is  ready to come home.  Patient is nontoxic, nonseptic appearing, in no apparent distress. Patient's pain and other symptoms adequately managed in emergency department.  Fluid bolus given.  Labs, imaging and vitals reviewed.  Patient does not meet the SIRS or Sepsis criteria.  On repeat exam patient does not have a surgical abdomin and there are nor peritoneal signs.  No concern for appendicitis, bowel obstruction, bowel perforation, cholecystitis, diverticulitis, PID or ectopic pregnancy. 2 patient's benign abdominal exam no imaging indicated at this time. Patient discharged home with symptomatic treatment and given strict instructions for follow-up with her gastroenterologist. She is seeing a pain clinic for pain management therefore she will not be given pain medication. We'll discharge home with Zofran. I have also discussed reasons to return immediately to the ER.  Patient expresses understanding and agrees with plan.   1. Medications: continue taking Prilosec and your current fentanyl pain management, take Zofran for nausea, usual home medications 2. Treatment: rest, drink plenty of fluids, take medications as prescibed 3. Follow Up: Please followup with your primary doctor for discussion of your diagnoses and further evaluation after today's visit; follow up with your gastroenterologist to help assist in evaluating your abdominal pain.          Dahlia Client Zak Gondek, PA-C 02/03/12 1409

## 2012-02-04 NOTE — ED Provider Notes (Signed)
Medical screening examination/treatment/procedure(s) were conducted as a shared visit with non-physician practitioner(s) and myself.  I personally evaluated the patient during the encounter Pt c/o epigastric pain. Dull. Mild to mod, non radiating. abd soft nt. Labs. Meds.   Suzi Roots, MD 02/04/12 [phone]

## 2012-03-05 ENCOUNTER — Other Ambulatory Visit (HOSPITAL_COMMUNITY): Payer: Self-pay

## 2012-03-06 ENCOUNTER — Encounter (HOSPITAL_COMMUNITY)
Admission: RE | Admit: 2012-03-06 | Discharge: 2012-03-06 | Disposition: A | Discharge status: Home / Self Care | Payer: Managed Care, Other (non HMO) | Source: Ambulatory Visit | Attending: Rheumatology | Admitting: Rheumatology

## 2012-03-06 DIAGNOSIS — G587 Mononeuritis multiplex: Secondary | ICD-10-CM | POA: Insufficient documentation

## 2012-03-06 DIAGNOSIS — G609 Hereditary and idiopathic neuropathy, unspecified: Secondary | ICD-10-CM | POA: Insufficient documentation

## 2012-03-06 DIAGNOSIS — M3130 Wegener's granulomatosis without renal involvement: Secondary | ICD-10-CM | POA: Insufficient documentation

## 2012-03-06 DIAGNOSIS — I10 Essential (primary) hypertension: Secondary | ICD-10-CM | POA: Insufficient documentation

## 2012-03-06 MED ORDER — SODIUM CHLORIDE 0.9 % IV SOLN
375.0000 mg/m2 | INTRAVENOUS | Status: DC
  Administered 2012-03-06: 700 mg via INTRAVENOUS
  Filled 2012-03-06: qty 70

## 2012-03-06 MED ORDER — SODIUM CHLORIDE 0.9 % IV SOLN
INTRAVENOUS | Status: DC
  Administered 2012-03-06: 250 mL via INTRAVENOUS

## 2012-03-06 MED ORDER — DIPHENHYDRAMINE HCL 25 MG PO CAPS
ORAL_CAPSULE | ORAL | Status: AC
  Administered 2012-03-06: 25 mg
  Filled 2012-03-06: qty 1

## 2012-03-06 MED ORDER — ACETAMINOPHEN 500 MG PO TABS
ORAL_TABLET | ORAL | Status: AC
  Administered 2012-03-06: 500 mg
  Filled 2012-03-06: qty 1

## 2012-03-06 MED ORDER — HYDROCODONE-ACETAMINOPHEN 5-325 MG PO TABS
ORAL_TABLET | ORAL | Status: AC
  Administered 2012-03-06: 1 via ORAL
  Filled 2012-03-06: qty 1

## 2012-03-06 MED ORDER — ACETAMINOPHEN 500 MG PO TABS
1000.0000 mg | ORAL_TABLET | ORAL | Status: DC
  Administered 2012-03-06: 500 mg via ORAL

## 2012-03-06 MED ORDER — DIPHENHYDRAMINE HCL 25 MG PO TABS
25.0000 mg | ORAL_TABLET | ORAL | Status: DC
  Filled 2012-03-06: qty 1

## 2012-03-06 MED ORDER — HYDROCODONE-ACETAMINOPHEN 5-325 MG PO TABS
1.0000 | ORAL_TABLET | Freq: Once | ORAL | Status: AC
  Administered 2012-03-06: 1 via ORAL

## 2012-03-06 MED ORDER — ACETAMINOPHEN 500 MG PO TABS
ORAL_TABLET | ORAL | Status: AC
  Filled 2012-03-06: qty 1

## 2012-03-06 NOTE — Progress Notes (Signed)
Called Dr. Nickola Major office at 1140 about getting patient something for 7/10 foot pain and left message with Darel Hong, RN.

## 2012-03-11 ENCOUNTER — Emergency Department (HOSPITAL_COMMUNITY)
Admission: EM | Admit: 2012-03-11 | Discharge: 2012-03-11 | Disposition: A | Discharge status: Home / Self Care | Payer: Managed Care, Other (non HMO) | Attending: Emergency Medicine | Admitting: Emergency Medicine

## 2012-03-11 ENCOUNTER — Encounter (HOSPITAL_COMMUNITY): Payer: Self-pay | Admitting: Emergency Medicine

## 2012-03-11 DIAGNOSIS — Z862 Personal history of diseases of the blood and blood-forming organs and certain disorders involving the immune mechanism: Secondary | ICD-10-CM | POA: Insufficient documentation

## 2012-03-11 DIAGNOSIS — Z8719 Personal history of other diseases of the digestive system: Secondary | ICD-10-CM | POA: Insufficient documentation

## 2012-03-11 DIAGNOSIS — Z79899 Other long term (current) drug therapy: Secondary | ICD-10-CM | POA: Insufficient documentation

## 2012-03-11 DIAGNOSIS — F411 Generalized anxiety disorder: Secondary | ICD-10-CM | POA: Insufficient documentation

## 2012-03-11 DIAGNOSIS — H9190 Unspecified hearing loss, unspecified ear: Secondary | ICD-10-CM | POA: Insufficient documentation

## 2012-03-11 DIAGNOSIS — Z8669 Personal history of other diseases of the nervous system and sense organs: Secondary | ICD-10-CM | POA: Insufficient documentation

## 2012-03-11 DIAGNOSIS — Z872 Personal history of diseases of the skin and subcutaneous tissue: Secondary | ICD-10-CM | POA: Insufficient documentation

## 2012-03-11 DIAGNOSIS — Z8739 Personal history of other diseases of the musculoskeletal system and connective tissue: Secondary | ICD-10-CM | POA: Insufficient documentation

## 2012-03-11 DIAGNOSIS — Z8639 Personal history of other endocrine, nutritional and metabolic disease: Secondary | ICD-10-CM | POA: Insufficient documentation

## 2012-03-11 DIAGNOSIS — M79609 Pain in unspecified limb: Secondary | ICD-10-CM | POA: Insufficient documentation

## 2012-03-11 DIAGNOSIS — Z87891 Personal history of nicotine dependence: Secondary | ICD-10-CM | POA: Insufficient documentation

## 2012-03-11 DIAGNOSIS — I10 Essential (primary) hypertension: Secondary | ICD-10-CM | POA: Insufficient documentation

## 2012-03-11 DIAGNOSIS — G8929 Other chronic pain: Secondary | ICD-10-CM

## 2012-03-11 MED ORDER — OXYCODONE-ACETAMINOPHEN 5-325 MG PO TABS: 1.0000 | ORAL_TABLET | Freq: Once | ORAL | Status: DC

## 2012-03-11 MED ORDER — OXYCODONE-ACETAMINOPHEN 5-325 MG PO TABS
2.0000 | ORAL_TABLET | Freq: Once | ORAL | Status: AC
  Administered 2012-03-11: 2 via ORAL
  Filled 2012-03-11: qty 2

## 2012-03-11 NOTE — ED Provider Notes (Signed)
Medical screening examination/treatment/procedure(s) were performed by non-physician practitioner and as supervising physician I was immediately available for consultation/collaboration.   Flint Melter, MD 03/11/12 [phone]

## 2012-03-11 NOTE — ED Notes (Signed)
Pt sts chronic foot pain worse over last couple of days; pt sts to see pain clinic soon but having increased pain

## 2012-03-11 NOTE — ED Provider Notes (Signed)
History   This chart was scribed for non-physician practitioner working with Flint Melter, MD by Gerlean Ren, ED Scribe. This patient was seen in room TR08C/TR08C and the patient's care was started at 7:39 PM.    CSN: [id number]  Arrival date & time 03/11/12  1826   First MD Initiated Contact with Patient 03/11/12 1924      Chief Complaint  Patient presents with  . Foot Pain    The history is provided by the patient. No language interpreter was used.   Laura [name] is a 47 y.o. female with h/o peripheral neuropathy, HTN, and anxiety who presents to the Emergency Department complaining of bilateral foot pain similar in quality, location, and severity to chronic pain associated with Wegner's granulomatosis.  Pt denies any recent injuries or traumas involving feet.  Pt denies any further physical symptoms and requests pain medication to hold her over as she switches between pain clinics.  Pt no longer takes Cymbalta.  Pt is a former smoker and denies alcohol use.    Past Medical History  Diagnosis Date  . Mononeuritis multiplex   . Unspecified hereditary and idiopathic peripheral neuropathy   . Hearing loss   . Rash   . Joint pain   . Ischemic bowel disease   . Cyst   . Wegener's granulomatosis   . Nasal bleeding   . Hypertension   . Hypothyroidism   . Anxiety     generalized    Past Surgical History  Procedure Date  . Tubal ligation 1992    Family History  Problem Relation Age of Onset  . Diabetes Mother   . Hypertension Mother   . Breast cancer Mother   . Diabetes Sister   . Hypertension Sister     History  Substance Use Topics  . Smoking status: Former Smoker    Quit date: 05/06/2007  . Smokeless tobacco: Never Used  . Alcohol Use: No    No OB history provided.   Review of Systems  Musculoskeletal:       Foot pain  Skin: Negative for wound.    Allergies  Review of patient's allergies indicates no known allergies.  Home Medications   Current  Outpatient Rx  Name  Route  Sig  Dispense  Refill  . AZATHIOPRINE 50 MG PO TABS   Oral   Take 100 mg by mouth 2 (two) times daily.         Marland Kitchen ESTRADIOL-NORETHINDRONE ACET 0.5-0.1 MG PO TABS   Oral   Take 1 tablet by mouth daily.   30 tablet   11   . LISINOPRIL 20 MG PO TABS   Oral   Take 20 mg by mouth daily.         Marland Kitchen OMEPRAZOLE 20 MG PO CPDR   Oral   Take 1 capsule (20 mg total) by mouth daily.   30 capsule   0   . PREDNISONE 20 MG PO TABS   Oral   Take 40 mg by mouth 2 (two) times daily.         . VENLAFAXINE HCL 75 MG PO TABS   Oral   Take 1 tablet (75 mg total) by mouth 2 (two) times daily.   60 tablet   1   . ZOLPIDEM TARTRATE 10 MG PO TABS   Oral   Take 10 mg by mouth at bedtime as needed. For sleep           BP 163/86  Pulse  88  Temp 97.7 F (36.5 C) (Oral)  Resp 18  SpO2 99%  Physical Exam  Nursing note and vitals reviewed. Constitutional: She is oriented to person, place, and time. She appears well-developed and well-nourished. No distress.  HENT:  Head: Normocephalic and atraumatic.  Eyes: EOM are normal.  Neck: Neck supple. No tracheal deviation present.  Pulmonary/Chest: Effort normal. No respiratory distress.  Musculoskeletal: Normal range of motion.       Feet bilaterally without swelling, redness, or palpable masses, pulses equal.  Neurological: She is alert and oriented to person, place, and time.  Skin: Skin is warm and dry.  Psychiatric: She has a normal mood and affect. Her behavior is normal.    ED Course  Procedures (including critical care time) DIAGNOSTIC STUDIES: Oxygen Saturation is 99% on room air, normal by my interpretation.    COORDINATION OF CARE: 7:44 PM- Patient informed of clinical course, understands medical decision-making process, and agrees with plan.  Labs Reviewed - No data to display No results found.   No diagnosis found. 1. Chronic pain    MDM  Patient reports symptoms typical of chronic  pain, now without pain medications. She reports follow up with appt. With new Pain Mgmt clinic Monday.  I personally performed the services described in this documentation, which was scribed in my presence. The recorded information has been reviewed and is accurate.        Arnoldo Hooker, PA-C 03/11/12 2003

## 2012-03-11 NOTE — ED Notes (Signed)
Pt given discharge paperwork; pt verbalized understanding of discharge; no additional questions by pt; VSS; e-signature obtained; 

## 2012-03-13 ENCOUNTER — Encounter (HOSPITAL_COMMUNITY)
Admission: RE | Admit: 2012-03-13 | Discharge: 2012-03-13 | Disposition: A | Discharge status: Home / Self Care | Payer: Managed Care, Other (non HMO) | Source: Ambulatory Visit | Attending: Rheumatology | Admitting: Rheumatology

## 2012-03-13 MED ORDER — ACETAMINOPHEN 500 MG PO TABS: 1000.0000 mg | ORAL_TABLET | ORAL | Status: DC

## 2012-03-13 MED ORDER — SODIUM CHLORIDE 0.9 % IV SOLN
375.0000 mg/m2 | INTRAVENOUS | Status: DC
  Administered 2012-03-13: 700 mg via INTRAVENOUS
  Filled 2012-03-13: qty 70

## 2012-03-13 MED ORDER — ACETAMINOPHEN 500 MG PO TABS
ORAL_TABLET | ORAL | Status: AC
  Administered 2012-03-13: 1000 mg
  Filled 2012-03-13: qty 2

## 2012-03-13 MED ORDER — DIPHENHYDRAMINE HCL 25 MG PO CAPS
ORAL_CAPSULE | ORAL | Status: AC
  Administered 2012-03-13: 25 mg
  Filled 2012-03-13: qty 1

## 2012-03-13 MED ORDER — SODIUM CHLORIDE 0.9 % IV SOLN
INTRAVENOUS | Status: DC
  Administered 2012-03-13: 09:00:00 via INTRAVENOUS

## 2012-03-13 MED ORDER — DIPHENHYDRAMINE HCL 25 MG PO TABS
25.0000 mg | ORAL_TABLET | ORAL | Status: DC
  Filled 2012-03-13: qty 1

## 2012-03-15 ENCOUNTER — Encounter (HOSPITAL_COMMUNITY): Payer: Self-pay | Admitting: *Deleted

## 2012-03-15 ENCOUNTER — Emergency Department (HOSPITAL_COMMUNITY)
Admission: EM | Admit: 2012-03-15 | Discharge: 2012-03-15 | Disposition: A | Discharge status: Home / Self Care | Payer: Managed Care, Other (non HMO) | Attending: Emergency Medicine | Admitting: Emergency Medicine

## 2012-03-15 ENCOUNTER — Emergency Department (HOSPITAL_COMMUNITY): Payer: Managed Care, Other (non HMO)

## 2012-03-15 DIAGNOSIS — Z862 Personal history of diseases of the blood and blood-forming organs and certain disorders involving the immune mechanism: Secondary | ICD-10-CM | POA: Insufficient documentation

## 2012-03-15 DIAGNOSIS — Z87891 Personal history of nicotine dependence: Secondary | ICD-10-CM | POA: Insufficient documentation

## 2012-03-15 DIAGNOSIS — IMO0002 Reserved for concepts with insufficient information to code with codable children: Secondary | ICD-10-CM | POA: Insufficient documentation

## 2012-03-15 DIAGNOSIS — Z8739 Personal history of other diseases of the musculoskeletal system and connective tissue: Secondary | ICD-10-CM | POA: Insufficient documentation

## 2012-03-15 DIAGNOSIS — Z872 Personal history of diseases of the skin and subcutaneous tissue: Secondary | ICD-10-CM | POA: Insufficient documentation

## 2012-03-15 DIAGNOSIS — Z8639 Personal history of other endocrine, nutritional and metabolic disease: Secondary | ICD-10-CM | POA: Insufficient documentation

## 2012-03-15 DIAGNOSIS — R109 Unspecified abdominal pain: Secondary | ICD-10-CM

## 2012-03-15 DIAGNOSIS — Z8669 Personal history of other diseases of the nervous system and sense organs: Secondary | ICD-10-CM | POA: Insufficient documentation

## 2012-03-15 DIAGNOSIS — H9190 Unspecified hearing loss, unspecified ear: Secondary | ICD-10-CM | POA: Insufficient documentation

## 2012-03-15 DIAGNOSIS — R1013 Epigastric pain: Secondary | ICD-10-CM | POA: Insufficient documentation

## 2012-03-15 DIAGNOSIS — Z8719 Personal history of other diseases of the digestive system: Secondary | ICD-10-CM | POA: Insufficient documentation

## 2012-03-15 DIAGNOSIS — Z79899 Other long term (current) drug therapy: Secondary | ICD-10-CM | POA: Insufficient documentation

## 2012-03-15 DIAGNOSIS — I10 Essential (primary) hypertension: Secondary | ICD-10-CM | POA: Insufficient documentation

## 2012-03-15 DIAGNOSIS — Z8679 Personal history of other diseases of the circulatory system: Secondary | ICD-10-CM | POA: Insufficient documentation

## 2012-03-15 DIAGNOSIS — Z8659 Personal history of other mental and behavioral disorders: Secondary | ICD-10-CM | POA: Insufficient documentation

## 2012-03-15 LAB — CBC WITH DIFFERENTIAL/PLATELET
Basophils Absolute: 0 K/uL (ref 0.0–0.1)
Basophils Relative: 0 % (ref 0–1)
Eosinophils Absolute: 0.2 K/uL (ref 0.0–0.7)
Eosinophils Relative: 2 % (ref 0–5)
HCT: 39.2 % (ref 36.0–46.0)
Hemoglobin: 13.1 g/dL (ref 12.0–15.0)
Lymphocytes Relative: 15 % (ref 12–46)
Lymphs Abs: 1.6 K/uL (ref 0.7–4.0)
MCH: 26.1 pg (ref 26.0–34.0)
MCHC: 33.4 g/dL (ref 30.0–36.0)
MCV: 78.2 fL (ref 78.0–100.0)
Monocytes Absolute: 0.8 K/uL (ref 0.1–1.0)
Monocytes Relative: 8 % (ref 3–12)
Neutro Abs: 7.6 K/uL (ref 1.7–7.7)
Neutrophils Relative %: 75 % (ref 43–77)
Platelets: 429 K/uL — ABNORMAL HIGH (ref 150–400)
RBC: 5.01 MIL/uL (ref 3.87–5.11)
RDW: 17.3 % — ABNORMAL HIGH (ref 11.5–15.5)
WBC: 10.1 K/uL (ref 4.0–10.5)

## 2012-03-15 LAB — POCT I-STAT, CHEM 8
BUN: 15 mg/dL (ref 6–23)
Creatinine, Ser: 0.9 mg/dL (ref 0.50–1.10)
Potassium: 3.3 mEq/L — ABNORMAL LOW (ref 3.5–5.1)
Sodium: 138 mEq/L (ref 135–145)

## 2012-03-15 LAB — HEPATIC FUNCTION PANEL
ALT: 19 U/L (ref 0–35)
AST: 41 U/L — ABNORMAL HIGH (ref 0–37)
Albumin: 3.3 g/dL — ABNORMAL LOW (ref 3.5–5.2)
Alkaline Phosphatase: 69 U/L (ref 39–117)
Bilirubin, Direct: <0.1 mg/dL (ref 0.0–0.3)
Total Bilirubin: 0.5 mg/dL (ref 0.3–1.2)
Total Protein: 7.5 g/dL (ref 6.0–8.3)

## 2012-03-15 LAB — URINE MICROSCOPIC-ADD ON

## 2012-03-15 LAB — URINALYSIS, ROUTINE W REFLEX MICROSCOPIC
Glucose, UA: NEGATIVE mg/dL
Hgb urine dipstick: NEGATIVE
Specific Gravity, Urine: 1.03 (ref 1.005–1.030)

## 2012-03-15 MED ORDER — SODIUM CHLORIDE 0.9 % IV SOLN
Freq: Once | INTRAVENOUS | Status: AC
  Administered 2012-03-15: 03:00:00 via INTRAVENOUS

## 2012-03-15 MED ORDER — GI COCKTAIL ~~LOC~~
30.0000 mL | Freq: Once | ORAL | Status: AC
  Administered 2012-03-15: 30 mL via ORAL
  Filled 2012-03-15: qty 30

## 2012-03-15 MED ORDER — FAMOTIDINE 20 MG PO TABS: 20.0000 mg | ORAL_TABLET | Freq: Two times a day (BID) | ORAL | Status: DC

## 2012-03-15 MED ORDER — HYDROMORPHONE HCL PF 1 MG/ML IJ SOLN
0.5000 mg | Freq: Once | INTRAMUSCULAR | Status: AC
  Administered 2012-03-15: 0.5 mg via INTRAVENOUS
  Filled 2012-03-15: qty 1

## 2012-03-15 MED ORDER — FAMOTIDINE IN NACL 20-0.9 MG/50ML-% IV SOLN
20.0000 mg | Freq: Once | INTRAVENOUS | Status: AC
  Administered 2012-03-15: 20 mg via INTRAVENOUS
  Filled 2012-03-15: qty 50

## 2012-03-15 MED ORDER — OXYCODONE-ACETAMINOPHEN 5-325 MG PO TABS: 1.0000 | ORAL_TABLET | ORAL | Status: DC | PRN

## 2012-03-15 NOTE — ED Notes (Signed)
Upper abd. Pain. Have wagner's disease; when vomiting a lot of blood coming out of nose x 2.

## 2012-03-15 NOTE — ED Provider Notes (Signed)
History     CSN: [id number]  Arrival date & time 03/15/12  0012   First MD Initiated Contact with Patient 03/15/12 [phone]      Chief Complaint  Patient presents with  . Abdominal Pain    (Consider location/radiation/quality/duration/timing/severity/associated sxs/prior treatment) HPI Comments: 47 year old female with a history of Wegener's granulomatosis, nose bleeding and hypertension who presents with a complaint of upper abdominal pain. She states this started approximately 20 hours ago, has been fluctuating throughout the day and has had periods where she is pain-free. The pain waxes and wanes, nothing seems to make it better or worse currently the pain is 8/10 sharp and located in the epigastrium. It is not related to her position and has been associated with 2 episodes of vomiting. She denies diarrhea, constipation or changes in her bowel habits. She has never had abdominal surgery and does not have any coughing or shortness of breath fevers or chills.  Patient is a 47 y.o. female presenting with abdominal pain. The history is provided by the patient.  Abdominal Pain The primary symptoms of the illness include abdominal pain.    Past Medical History  Diagnosis Date  . Mononeuritis multiplex   . Unspecified hereditary and idiopathic peripheral neuropathy   . Hearing loss   . Rash   . Joint pain   . Ischemic bowel disease   . Cyst   . Wegener's granulomatosis   . Nasal bleeding   . Hypertension   . Hypothyroidism   . Anxiety     generalized    Past Surgical History  Procedure Date  . Tubal ligation 1992    Family History  Problem Relation Age of Onset  . Diabetes Mother   . Hypertension Mother   . Breast cancer Mother   . Diabetes Sister   . Hypertension Sister     History  Substance Use Topics  . Smoking status: Former Smoker    Quit date: 05/06/2007  . Smokeless tobacco: Never Used  . Alcohol Use: No    OB History    Grav Para Term Preterm Abortions  TAB SAB Ect Mult Living                  Review of Systems  Gastrointestinal: Positive for abdominal pain.  All other systems reviewed and are negative.    Allergies  Review of patient's allergies indicates no known allergies.  Home Medications   Current Outpatient Rx  Name  Route  Sig  Dispense  Refill  . AZATHIOPRINE 50 MG PO TABS   Oral   Take 100 mg by mouth 2 (two) times daily.         Marland Kitchen ESTRADIOL-NORETHINDRONE ACET 0.5-0.1 MG PO TABS   Oral   Take 1 tablet by mouth daily.   30 tablet   11   . FENTANYL 50 MCG/HR TD PT72   Transdermal   Place 1 patch onto the skin every 3 (three) days.         Marland Kitchen LISINOPRIL 20 MG PO TABS   Oral   Take 20 mg by mouth daily.         Marland Kitchen OMEPRAZOLE 20 MG PO CPDR   Oral   Take 1 capsule (20 mg total) by mouth daily.   30 capsule   0   . PREDNISONE 20 MG PO TABS   Oral   Take 40 mg by mouth 2 (two) times daily.         . VENLAFAXINE HCL  75 MG PO TABS   Oral   Take 1 tablet (75 mg total) by mouth 2 (two) times daily.   60 tablet   1   . ZOLPIDEM TARTRATE 10 MG PO TABS   Oral   Take 10 mg by mouth at bedtime as needed. For sleep         . FAMOTIDINE 20 MG PO TABS   Oral   Take 1 tablet (20 mg total) by mouth 2 (two) times daily.   30 tablet   0   . OXYCODONE-ACETAMINOPHEN 5-325 MG PO TABS   Oral   Take 1 tablet by mouth every 4 (four) hours as needed for pain.   10 tablet   0     BP 125/100  Pulse 112  Temp 99.3 F (37.4 C) (Oral)  Resp 16  SpO2 98%  Physical Exam  Nursing note and vitals reviewed. Constitutional: She appears well-developed and well-nourished. No distress.  HENT:  Head: Normocephalic and atraumatic.  Mouth/Throat: Oropharynx is clear and moist. No oropharyngeal exudate.  Eyes: Conjunctivae normal and EOM are normal. Pupils are equal, round, and reactive to light. Right eye exhibits no discharge. Left eye exhibits no discharge. No scleral icterus.  Neck: Normal range of motion.  Neck supple. No JVD present. No thyromegaly present.  Cardiovascular: Normal rate, regular rhythm, normal heart sounds and intact distal pulses.  Exam reveals no gallop and no friction rub.   No murmur heard. Pulmonary/Chest: Effort normal and breath sounds normal. No respiratory distress. She has no wheezes. She has no rales.  Abdominal: Soft. Bowel sounds are normal. She exhibits no distension and no mass. There is tenderness ( Epigastric tenderness without masses guarding or distention).       No pain in the right upper quadrant, no pain at McBurney's point.  Musculoskeletal: Normal range of motion. She exhibits no edema and no tenderness.  Lymphadenopathy:    She has no cervical adenopathy.  Neurological: She is alert. Coordination normal.  Skin: Skin is warm and dry. No rash noted. No erythema.  Psychiatric: She has a normal mood and affect. Her behavior is normal.    ED Course  Procedures (including critical care time)  Labs Reviewed  CBC WITH DIFFERENTIAL - Abnormal; Notable for the following:    RDW 17.3 (*)     Platelets 429 (*)     All other components within normal limits  URINALYSIS, ROUTINE W REFLEX MICROSCOPIC - Abnormal; Notable for the following:    APPearance CLOUDY (*)     Ketones, ur 15 (*)     Protein, ur 30 (*)     All other components within normal limits  POCT I-STAT, CHEM 8 - Abnormal; Notable for the following:    Potassium 3.3 (*)     Glucose, Bld 110 (*)     All other components within normal limits  HEPATIC FUNCTION PANEL - Abnormal; Notable for the following:    Albumin 3.3 (*)     AST 41 (*)     All other components within normal limits  URINE MICROSCOPIC-ADD ON - Abnormal; Notable for the following:    Bacteria, UA FEW (*)     All other components within normal limits  LIPASE, BLOOD   Dg Abd Acute W/chest  03/15/2012  *RADIOLOGY REPORT*  Clinical Data: Epigastric and diffuse abdominal pain.  Emesis.  ACUTE ABDOMEN SERIES (ABDOMEN 2 VIEW & CHEST  1 VIEW)  Comparison: Chest 02/07/2011.  CT abdomen and pelvis 02/07/2010.  Findings: Mild linear fibrosis in the left midlung. The heart size and pulmonary vascularity are normal. The lungs appear clear and expanded without focal air space disease or consolidation. No blunting of the costophrenic angles.  No significant change since previous study.  Scattered gas and stool in the colon.  No small or large bowel distension.  No free intra-abdominal air.  No abnormal air fluid levels.  No radiopaque stones.  Visualized bones appear intact.  IMPRESSION: The no evidence of active pulmonary disease.  Nonobstructive bowel gas pattern.   Original Report Authenticated By: Burman Nieves, M.D.      1. Abdominal pain       MDM  Overall the patient is well-appearing, her tachycardia is gradually resolving and is much improved since triage. She will be given a GI cocktail, Pepcid, check a lipase and liver function and acute abdominal series.  Labs normal,  including complete blood count without a leukocytosis, metabolic panel without any renal dysfunction, liver tests which are essentially normal and a normal lipase. Urinalysis revealed slight dehydration with mild ketonuria but no glucosuria. She has an acute abdominal series showing no bowel obstruction, no signs of free air. On repeat exam she has mild epigastric tenderness, she states this improved with a GI cocktail and Pepcid, much much better after receiving a half a milligram of Dilaudid.  At this time the etiology of her pain is not totally certain though I suspect it is gastric in origin and I have referred her to her family Dr. for further workup should her symptoms persist. Will discharge with Pepcid and a 10 tablet prescription of Percocet.      Vida Roller, MD 03/15/12 [phone]

## 2012-03-15 NOTE — ED Notes (Signed)
Patient transported to X-ray 

## 2012-03-20 ENCOUNTER — Encounter (HOSPITAL_COMMUNITY)
Admission: RE | Admit: 2012-03-20 | Discharge: 2012-03-20 | Disposition: A | Discharge status: Home / Self Care | Payer: Managed Care, Other (non HMO) | Source: Ambulatory Visit | Attending: Rheumatology | Admitting: Rheumatology

## 2012-03-20 MED ORDER — ACETAMINOPHEN 500 MG PO TABS
1000.0000 mg | ORAL_TABLET | ORAL | Status: DC
  Administered 2012-03-20: 1000 mg via ORAL

## 2012-03-20 MED ORDER — DIPHENHYDRAMINE HCL 25 MG PO TABS
25.0000 mg | ORAL_TABLET | ORAL | Status: DC
  Filled 2012-03-20: qty 1

## 2012-03-20 MED ORDER — SODIUM CHLORIDE 0.9 % IV SOLN
INTRAVENOUS | Status: DC
  Administered 2012-03-20: 11:00:00 via INTRAVENOUS

## 2012-03-20 MED ORDER — DIPHENHYDRAMINE HCL 25 MG PO CAPS
ORAL_CAPSULE | ORAL | Status: AC
  Administered 2012-03-20: 25 mg
  Filled 2012-03-20: qty 1

## 2012-03-20 MED ORDER — SODIUM CHLORIDE 0.9 % IV SOLN
375.0000 mg/m2 | INTRAVENOUS | Status: DC
  Administered 2012-03-20: 700 mg via INTRAVENOUS
  Filled 2012-03-20: qty 70

## 2012-03-20 MED ORDER — ACETAMINOPHEN 500 MG PO TABS
ORAL_TABLET | ORAL | Status: AC
  Filled 2012-03-20: qty 2

## 2012-03-26 ENCOUNTER — Other Ambulatory Visit (HOSPITAL_COMMUNITY): Payer: Self-pay | Admitting: *Deleted

## 2012-03-27 ENCOUNTER — Encounter (HOSPITAL_COMMUNITY)
Admission: RE | Admit: 2012-03-27 | Discharge: 2012-03-27 | Disposition: A | Discharge status: Home / Self Care | Payer: Managed Care, Other (non HMO) | Source: Ambulatory Visit | Attending: Rheumatology | Admitting: Rheumatology

## 2012-03-27 MED ORDER — SODIUM CHLORIDE 0.9 % IV SOLN
375.0000 mg/m2 | INTRAVENOUS | Status: AC
  Administered 2012-03-27: 700 mg via INTRAVENOUS
  Filled 2012-03-27: qty 70

## 2012-03-27 MED ORDER — ACETAMINOPHEN 500 MG PO TABS
ORAL_TABLET | ORAL | Status: AC
  Filled 2012-03-27: qty 2

## 2012-03-27 MED ORDER — SODIUM CHLORIDE 0.9 % IV SOLN
INTRAVENOUS | Status: AC
  Administered 2012-03-27: 09:00:00 via INTRAVENOUS

## 2012-03-27 MED ORDER — DIPHENHYDRAMINE HCL 25 MG PO CAPS
ORAL_CAPSULE | ORAL | Status: AC
  Filled 2012-03-27: qty 1

## 2012-03-27 MED ORDER — ACETAMINOPHEN 500 MG PO TABS
1000.0000 mg | ORAL_TABLET | ORAL | Status: AC
  Administered 2012-03-27: 1000 mg via ORAL

## 2012-03-27 MED ORDER — DIPHENHYDRAMINE HCL 25 MG PO TABS
25.0000 mg | ORAL_TABLET | ORAL | Status: AC
  Administered 2012-03-27: 25 mg via ORAL
  Filled 2012-03-27: qty 1

## 2012-04-04 ENCOUNTER — Emergency Department (HOSPITAL_COMMUNITY)
Admission: EM | Admit: 2012-04-04 | Discharge: 2012-04-04 | Disposition: A | Discharge status: Home / Self Care | Payer: Managed Care, Other (non HMO) | Attending: Emergency Medicine | Admitting: Emergency Medicine

## 2012-04-04 ENCOUNTER — Encounter (HOSPITAL_COMMUNITY): Payer: Self-pay | Admitting: Emergency Medicine

## 2012-04-04 DIAGNOSIS — E039 Hypothyroidism, unspecified: Secondary | ICD-10-CM | POA: Insufficient documentation

## 2012-04-04 DIAGNOSIS — F411 Generalized anxiety disorder: Secondary | ICD-10-CM | POA: Insufficient documentation

## 2012-04-04 DIAGNOSIS — Z87891 Personal history of nicotine dependence: Secondary | ICD-10-CM | POA: Insufficient documentation

## 2012-04-04 DIAGNOSIS — Z8719 Personal history of other diseases of the digestive system: Secondary | ICD-10-CM | POA: Insufficient documentation

## 2012-04-04 DIAGNOSIS — R1013 Epigastric pain: Secondary | ICD-10-CM | POA: Insufficient documentation

## 2012-04-04 DIAGNOSIS — Z8739 Personal history of other diseases of the musculoskeletal system and connective tissue: Secondary | ICD-10-CM | POA: Insufficient documentation

## 2012-04-04 DIAGNOSIS — Z79899 Other long term (current) drug therapy: Secondary | ICD-10-CM | POA: Insufficient documentation

## 2012-04-04 DIAGNOSIS — R51 Headache: Secondary | ICD-10-CM | POA: Insufficient documentation

## 2012-04-04 DIAGNOSIS — Z8669 Personal history of other diseases of the nervous system and sense organs: Secondary | ICD-10-CM | POA: Insufficient documentation

## 2012-04-04 DIAGNOSIS — I10 Essential (primary) hypertension: Secondary | ICD-10-CM | POA: Insufficient documentation

## 2012-04-04 DIAGNOSIS — Z8679 Personal history of other diseases of the circulatory system: Secondary | ICD-10-CM | POA: Insufficient documentation

## 2012-04-04 DIAGNOSIS — Z872 Personal history of diseases of the skin and subcutaneous tissue: Secondary | ICD-10-CM | POA: Insufficient documentation

## 2012-04-04 LAB — URINALYSIS, ROUTINE W REFLEX MICROSCOPIC
Bilirubin Urine: NEGATIVE
Glucose, UA: NEGATIVE mg/dL
Hgb urine dipstick: NEGATIVE
Ketones, ur: 15 mg/dL — AB
Specific Gravity, Urine: 1.035 — ABNORMAL HIGH (ref 1.005–1.030)
pH: 7 (ref 5.0–8.0)

## 2012-04-04 LAB — COMPREHENSIVE METABOLIC PANEL
ALT: 16 U/L (ref 0–35)
Albumin: 3.9 g/dL (ref 3.5–5.2)
Alkaline Phosphatase: 82 U/L (ref 39–117)
Chloride: 98 mEq/L (ref 96–112)
Potassium: 4 mEq/L (ref 3.5–5.1)
Sodium: 136 mEq/L (ref 135–145)
Total Bilirubin: 0.3 mg/dL (ref 0.3–1.2)
Total Protein: 8.1 g/dL (ref 6.0–8.3)

## 2012-04-04 LAB — URINE MICROSCOPIC-ADD ON

## 2012-04-04 LAB — CBC WITH DIFFERENTIAL/PLATELET
Basophils Relative: 0 % (ref 0–1)
Eosinophils Absolute: 0 10*3/uL (ref 0.0–0.7)
Hemoglobin: 12.4 g/dL (ref 12.0–15.0)
Lymphs Abs: 0.8 10*3/uL (ref 0.7–4.0)
MCH: 25.5 pg — ABNORMAL LOW (ref 26.0–34.0)
MCHC: 32.5 g/dL (ref 30.0–36.0)
Monocytes Relative: 2 % — ABNORMAL LOW (ref 3–12)
Neutro Abs: 11.7 10*3/uL — ABNORMAL HIGH (ref 1.7–7.7)
Neutrophils Relative %: 91 % — ABNORMAL HIGH (ref 43–77)
Platelets: 478 10*3/uL — ABNORMAL HIGH (ref 150–400)
RBC: 4.87 MIL/uL (ref 3.87–5.11)

## 2012-04-04 LAB — POCT I-STAT TROPONIN I

## 2012-04-04 MED ORDER — HYDROMORPHONE HCL PF 1 MG/ML IJ SOLN
1.0000 mg | Freq: Once | INTRAMUSCULAR | Status: AC
  Administered 2012-04-04: 1 mg via INTRAVENOUS
  Filled 2012-04-04: qty 1

## 2012-04-04 MED ORDER — SODIUM CHLORIDE 0.9 % IV BOLUS (SEPSIS)
1000.0000 mL | Freq: Once | INTRAVENOUS | Status: AC
  Administered 2012-04-04: 1000 mL via INTRAVENOUS

## 2012-04-04 MED ORDER — OMEPRAZOLE 20 MG PO CPDR: 20.0000 mg | DELAYED_RELEASE_CAPSULE | Freq: Every day | ORAL | Status: DC

## 2012-04-04 MED ORDER — GI COCKTAIL ~~LOC~~
30.0000 mL | Freq: Once | ORAL | Status: AC
  Administered 2012-04-04: 30 mL via ORAL
  Filled 2012-04-04: qty 30

## 2012-04-04 MED ORDER — OXYCODONE-ACETAMINOPHEN 5-325 MG PO TABS: 2.0000 | ORAL_TABLET | ORAL | Status: DC | PRN

## 2012-04-04 NOTE — ED Notes (Signed)
Pt c/o headache and abdominal pain onset last night. Pt denies n/v at present.

## 2012-04-04 NOTE — ED Notes (Signed)
Called pt for room placement no answer. 

## 2012-04-04 NOTE — ED Provider Notes (Signed)
History     CSN: [id number]  Arrival date & time 04/04/12  1505   First MD Initiated Contact with Patient 04/04/12 1937      Chief Complaint  Patient presents with  . Abdominal Pain  . Headache    (Consider location/radiation/quality/duration/timing/severity/associated sxs/prior treatment) Patient is a 46 y.o. female presenting with abdominal pain and headaches. The history is provided by the patient.  Abdominal Pain Pain location:  Epigastric Pain quality: no fullness, not squeezing and not throbbing   Pain radiates to:  Does not radiate Pain severity:  Moderate Onset quality:  Gradual Duration:  2 days Timing:  Constant Progression:  Worsening Chronicity:  Chronic Context: not diet changes and not eating   Relieved by:  Nothing Worsened by:  Palpation Ineffective treatments:  None tried Associated symptoms: no chest pain, no diarrhea, no fatigue, no fever, no nausea, no shortness of breath and no vomiting   Headache Pain location:  Frontal Radiates to:  Does not radiate Onset quality:  Gradual Duration:  1 day Timing:  Constant Progression:  Unchanged Chronicity:  Recurrent Similar to prior headaches: yes   Context: not activity, not exposure to bright light, not eating, not loud noise and not straining   Relieved by:  Nothing Worsened by:  Nothing tried Ineffective treatments:  None tried Associated symptoms: abdominal pain   Associated symptoms: no back pain, no congestion, no diarrhea, no drainage, no fatigue, no fever, no nausea, no photophobia and no vomiting     Past Medical History  Diagnosis Date  . Mononeuritis multiplex   . Unspecified hereditary and idiopathic peripheral neuropathy   . Hearing loss   . Rash   . Joint pain   . Ischemic bowel disease   . Cyst   . Wegener's granulomatosis   . Nasal bleeding   . Hypertension   . Hypothyroidism   . Anxiety     generalized    Past Surgical History  Procedure Laterality Date  . Tubal ligation   1992    Family History  Problem Relation Age of Onset  . Diabetes Mother   . Hypertension Mother   . Breast cancer Mother   . Diabetes Sister   . Hypertension Sister     History  Substance Use Topics  . Smoking status: Former Smoker    Quit date: 05/06/2007  . Smokeless tobacco: Never Used  . Alcohol Use: No    OB History   Grav Para Term Preterm Abortions TAB SAB Ect Mult Living                  Review of Systems  Constitutional: Negative for fever and fatigue.  HENT: Negative for congestion, rhinorrhea and postnasal drip.   Eyes: Negative for photophobia and visual disturbance.  Respiratory: Negative for chest tightness, shortness of breath and wheezing.   Cardiovascular: Negative for chest pain, palpitations and leg swelling.  Gastrointestinal: Positive for abdominal pain. Negative for nausea, vomiting and diarrhea.  Genitourinary: Negative for urgency, frequency and difficulty urinating.  Musculoskeletal: Negative for back pain and arthralgias.  Skin: Negative for rash and wound.  Neurological: Positive for headaches. Negative for weakness.  Psychiatric/Behavioral: Negative for confusion and agitation.    Allergies  Review of patient's allergies indicates no known allergies.  Home Medications   Current Outpatient Rx  Name  Route  Sig  Dispense  Refill  . azaTHIOprine (IMURAN) 50 MG tablet   Oral   Take 100 mg by mouth 2 (two) times daily.         Marland Kitchen  Estradiol-Norethindrone Acet 0.5-0.1 MG per tablet   Oral   Take 1 tablet by mouth daily.   30 tablet   11   . famotidine (PEPCID) 20 MG tablet   Oral   Take 1 tablet (20 mg total) by mouth 2 (two) times daily.   30 tablet   0   . fentaNYL (DURAGESIC - DOSED MCG/HR) 50 MCG/HR   Transdermal   Place 1 patch onto the skin every 3 (three) days.         Marland Kitchen lisinopril (PRINIVIL,ZESTRIL) 20 MG tablet   Oral   Take 20 mg by mouth daily.         Marland Kitchen omeprazole (PRILOSEC) 20 MG capsule   Oral   Take 1  capsule (20 mg total) by mouth daily.   30 capsule   0   . predniSONE (DELTASONE) 20 MG tablet   Oral   Take 40 mg by mouth 2 (two) times daily.         Marland Kitchen venlafaxine (EFFEXOR) 75 MG tablet   Oral   Take 1 tablet (75 mg total) by mouth 2 (two) times daily.   60 tablet   1   . zolpidem (AMBIEN) 10 MG tablet   Oral   Take 10 mg by mouth at bedtime as needed. For sleep         . omeprazole (PRILOSEC) 20 MG capsule   Oral   Take 1 capsule (20 mg total) by mouth daily.   30 capsule   0   . oxyCODONE-acetaminophen (PERCOCET/ROXICET) 5-325 MG per tablet   Oral   Take 2 tablets by mouth every 4 (four) hours as needed for pain.   6 tablet   0     BP 138/89  Pulse 72  Temp(Src) 97.9 F (36.6 C) (Oral)  Resp 16  SpO2 100%  Physical Exam  Nursing note and vitals reviewed. Constitutional: She is oriented to person, place, and time. She appears well-developed and well-nourished. No distress.  HENT:  Head: Normocephalic and atraumatic.  Mouth/Throat: Oropharynx is clear and moist.  Eyes: EOM are normal. Pupils are equal, round, and reactive to light.  Neck: Normal range of motion. Neck supple.  Cardiovascular: Normal rate, regular rhythm, normal heart sounds and intact distal pulses.   Pulmonary/Chest: Effort normal and breath sounds normal. She has no wheezes. She has no rales.  Abdominal: Soft. Bowel sounds are normal. She exhibits no distension. There is no tenderness. There is no rebound and no guarding.  Musculoskeletal: Normal range of motion. She exhibits no edema and no tenderness.  Lymphadenopathy:    She has no cervical adenopathy.  Neurological: She is alert and oriented to person, place, and time. She displays normal reflexes. No cranial nerve deficit. She exhibits normal muscle tone. Coordination normal.  Normal cerebellar exam.   Skin: Skin is warm and dry. No rash noted.  Psychiatric: She has a normal mood and affect. Her behavior is normal.    ED Course   Procedures (including critical care time)   Date: 04/05/2012  Rate: 78  Rhythm: normal sinus rhythm  QRS Axis: left  Intervals: normal  ST/T Wave abnormalities: normal  Conduction Disutrbances:none  Narrative Interpretation:   Old EKG Reviewed: none available    Labs Reviewed  CBC WITH DIFFERENTIAL - Abnormal; Notable for the following:    WBC 12.7 (*)    MCH 25.5 (*)    RDW 17.0 (*)    Platelets 478 (*)    Neutrophils Relative  91 (*)    Neutro Abs 11.7 (*)    Lymphocytes Relative 6 (*)    Monocytes Relative 2 (*)    All other components within normal limits  COMPREHENSIVE METABOLIC PANEL - Abnormal; Notable for the following:    Glucose, Bld 161 (*)    All other components within normal limits  URINALYSIS, ROUTINE W REFLEX MICROSCOPIC - Abnormal; Notable for the following:    Specific Gravity, Urine 1.035 (*)    Ketones, ur 15 (*)    Protein, ur 100 (*)    Urobilinogen, UA 4.0 (*)    All other components within normal limits  URINE MICROSCOPIC-ADD ON - Abnormal; Notable for the following:    Squamous Epithelial / LPF FEW (*)    All other components within normal limits  LIPASE, BLOOD  POCT I-STAT TROPONIN I   No results found.   1. Epigastric pain   2. Headache       MDM  81F with pmhx of Wegener's granulomatosis on chronic steroids here with epigastric pain and headache since yesterday. Pt states that she gets recurrent epigastric pain due to her Wegener's. She also gets recurrent headaches but not as often as the abdominal pain. No chest pain, vomiting, diarrhea, fevers, or shortness of breath. Headache is typical of her previous. Not sudden onset or worse headache of life. Abdominal pain also similar to previous. Normally IV pain meds and GI cocktail help her symptoms. She is scheduled for her GI doctor next week. Exam as noted above. No concern for meningitis, SAH, brain tumor or serious etiology of headache. Also, abdomen benign. Likely gastritis or PUD. Doubt  surgical process. Does not warrant imaging. Labs reveal mildly elevated WBC which is non-specific. Normal electrolytes and renal function. Urine c/w dehydration with ketones and elevated specific gravity. Pt given 1L IVF, Dilaudid 1 mg IV, and GI cocktail.  Pt now feeling better. Headache resolved. Still with some mild epigastric pain but much improved. Abdomen re-examined and remains benign. Felt stable for d/c home. She will f/u with GI next week. Return precautions given.        Johnnette Gourd, MD 04/05/12 [phone]

## 2012-04-04 NOTE — ED Notes (Signed)
The patient is AOx4 and comfortable with her discharge instructions.  Her ride home is present. 

## 2012-04-05 NOTE — ED Provider Notes (Signed)
I saw and evaluated the patient, reviewed the resident's note and I agree with the findings and plan. I have reviewed EKG and agree with the resident interpretation.  you Patient with a history of Wegener's on chronic steroids who is here for epigastric pain and a headache. She states that this is typical for her and she gets this a lot. Her symptoms improved after GI cocktail and IV pain medication. She states there is nothing about the episode today compared to her prior episodes. She has appointment with GI this week. Mildly elevated white blood cell count but the rest of her labs are within normal limits. Patient tolerating by mouth's and feels much better and wants to return home.  Gwyneth Sprout, MD 04/05/12 1907

## 2012-06-18 NOTE — Progress Notes (Signed)
Pt presented with perirectal abscess, this area was drained in office.  Will do sitz bathes 3 times daily.  Follow up as needed

## 2012-10-06 ENCOUNTER — Other Ambulatory Visit: Payer: Self-pay | Admitting: Women's Health

## 2012-11-09 ENCOUNTER — Emergency Department (HOSPITAL_COMMUNITY): Payer: Managed Care, Other (non HMO)

## 2012-11-09 ENCOUNTER — Emergency Department (HOSPITAL_COMMUNITY)
Admission: EM | Admit: 2012-11-09 | Discharge: 2012-11-09 | Disposition: A | Discharge status: Home / Self Care | Payer: Managed Care, Other (non HMO) | Attending: Emergency Medicine | Admitting: Emergency Medicine

## 2012-11-09 ENCOUNTER — Encounter (HOSPITAL_COMMUNITY): Payer: Self-pay | Admitting: *Deleted

## 2012-11-09 DIAGNOSIS — G587 Mononeuritis multiplex: Secondary | ICD-10-CM | POA: Insufficient documentation

## 2012-11-09 DIAGNOSIS — E039 Hypothyroidism, unspecified: Secondary | ICD-10-CM | POA: Insufficient documentation

## 2012-11-09 DIAGNOSIS — R5381 Other malaise: Secondary | ICD-10-CM | POA: Insufficient documentation

## 2012-11-09 DIAGNOSIS — G609 Hereditary and idiopathic neuropathy, unspecified: Secondary | ICD-10-CM | POA: Insufficient documentation

## 2012-11-09 DIAGNOSIS — R110 Nausea: Secondary | ICD-10-CM | POA: Insufficient documentation

## 2012-11-09 DIAGNOSIS — Z8719 Personal history of other diseases of the digestive system: Secondary | ICD-10-CM | POA: Insufficient documentation

## 2012-11-09 DIAGNOSIS — H9190 Unspecified hearing loss, unspecified ear: Secondary | ICD-10-CM | POA: Insufficient documentation

## 2012-11-09 DIAGNOSIS — R630 Anorexia: Secondary | ICD-10-CM | POA: Insufficient documentation

## 2012-11-09 DIAGNOSIS — Z3202 Encounter for pregnancy test, result negative: Secondary | ICD-10-CM | POA: Insufficient documentation

## 2012-11-09 DIAGNOSIS — M3130 Wegener's granulomatosis without renal involvement: Secondary | ICD-10-CM | POA: Insufficient documentation

## 2012-11-09 DIAGNOSIS — R51 Headache: Secondary | ICD-10-CM | POA: Insufficient documentation

## 2012-11-09 DIAGNOSIS — I10 Essential (primary) hypertension: Secondary | ICD-10-CM | POA: Insufficient documentation

## 2012-11-09 DIAGNOSIS — Z79899 Other long term (current) drug therapy: Secondary | ICD-10-CM | POA: Insufficient documentation

## 2012-11-09 DIAGNOSIS — F411 Generalized anxiety disorder: Secondary | ICD-10-CM | POA: Insufficient documentation

## 2012-11-09 DIAGNOSIS — Z87891 Personal history of nicotine dependence: Secondary | ICD-10-CM | POA: Insufficient documentation

## 2012-11-09 LAB — CBC WITH DIFFERENTIAL/PLATELET
Eosinophils Absolute: 0.4 10*3/uL (ref 0.0–0.7)
Lymphocytes Relative: 26 % (ref 12–46)
Lymphs Abs: 1.9 10*3/uL (ref 0.7–4.0)
MCH: 25.2 pg — ABNORMAL LOW (ref 26.0–34.0)
Neutro Abs: 4.3 10*3/uL (ref 1.7–7.7)
Neutrophils Relative %: 60 % (ref 43–77)
Platelets: 368 10*3/uL (ref 150–400)
RBC: 4.52 MIL/uL (ref 3.87–5.11)
WBC: 7.2 10*3/uL (ref 4.0–10.5)

## 2012-11-09 LAB — URINALYSIS, ROUTINE W REFLEX MICROSCOPIC
Bilirubin Urine: NEGATIVE
Hgb urine dipstick: NEGATIVE
Specific Gravity, Urine: 1.031 — ABNORMAL HIGH (ref 1.005–1.030)
pH: 8 (ref 5.0–8.0)

## 2012-11-09 LAB — COMPREHENSIVE METABOLIC PANEL
ALT: 12 U/L (ref 0–35)
Albumin: 3.4 g/dL — ABNORMAL LOW (ref 3.5–5.2)
Alkaline Phosphatase: 91 U/L (ref 39–117)
Chloride: 99 mEq/L (ref 96–112)
GFR calc Af Amer: >90 mL/min (ref >90)
Glucose, Bld: 95 mg/dL (ref 70–99)
Potassium: 4.1 mEq/L (ref 3.5–5.1)
Sodium: 134 mEq/L — ABNORMAL LOW (ref 135–145)
Total Protein: 7.1 g/dL (ref 6.0–8.3)

## 2012-11-09 LAB — TROPONIN I: Troponin I: <0.3 ng/mL (ref <0.30)

## 2012-11-09 LAB — URINE MICROSCOPIC-ADD ON

## 2012-11-09 MED ORDER — ONDANSETRON HCL 4 MG/2ML IJ SOLN
4.0000 mg | Freq: Once | INTRAMUSCULAR | Status: AC
  Administered 2012-11-09: 4 mg via INTRAVENOUS
  Filled 2012-11-09: qty 2

## 2012-11-09 MED ORDER — HYDROCODONE-ACETAMINOPHEN 5-325 MG PO TABS
1.0000 | ORAL_TABLET | Freq: Once | ORAL | Status: AC
  Administered 2012-11-09: 1 via ORAL
  Filled 2012-11-09: qty 1

## 2012-11-09 MED ORDER — HYDROCODONE-ACETAMINOPHEN 5-325 MG PO TABS: 1.0000 | ORAL_TABLET | ORAL | Status: DC | PRN

## 2012-11-09 MED ORDER — PROMETHAZINE HCL 25 MG PO TABS: 25.0000 mg | ORAL_TABLET | Freq: Four times a day (QID) | ORAL | Status: DC | PRN

## 2012-11-09 MED ORDER — HYDROMORPHONE HCL PF 1 MG/ML IJ SOLN
1.0000 mg | Freq: Once | INTRAMUSCULAR | Status: AC
  Administered 2012-11-09: 1 mg via INTRAVENOUS
  Filled 2012-11-09: qty 1

## 2012-11-09 MED ORDER — SODIUM CHLORIDE 0.9 % IV BOLUS (SEPSIS)
1000.0000 mL | Freq: Once | INTRAVENOUS | Status: AC
  Administered 2012-11-09: 1000 mL via INTRAVENOUS

## 2012-11-09 NOTE — ED Notes (Signed)
Pt is here with upper abdominal pain since yesterday.  Pt denies vomiting or diarrhea.  Pt reports frontal headache since yesterday

## 2012-11-09 NOTE — ED Provider Notes (Signed)
Medical screening examination/treatment/procedure(s) were performed by non-physician practitioner and as supervising physician I was immediately available for consultation/collaboration.  Shon Baton, MD 11/09/12 [phone]

## 2012-11-09 NOTE — ED Provider Notes (Signed)
CSN: [id number]     Arrival date & time 11/09/12  1050 History   First MD Initiated Contact with Patient 11/09/12 1138     Chief Complaint  Patient presents with  . Abdominal Pain  . Headache   (Consider location/radiation/quality/duration/timing/severity/associated sxs/prior Treatment) HPI Comments: Patient here with a history of Wegener's who presents with exacerbations of this usually with temporal headache and epigastric pain.  She states that this usually happens about 1-2 times per year and usually later in the year when she is feeling stressed.  She reports starting yesterday she began to have some mild epigastric pain, states that this has since increased.  She reports nausea with this and no real appetite, states no vomiting, diarrhea, constipation with this.  She also reports a mild to now moderate temporal headache.  This is not associated with blurred vision, photophobia, numbness or weakness.   Patient is a 47 y.o. female presenting with abdominal pain and headaches. The history is provided by the patient. No language interpreter was used.  Abdominal Pain Pain location:  Epigastric Pain quality: dull   Pain quality: not aching, not burning, not cramping, not gnawing, not heavy, no pressure, not sharp, not shooting, not stabbing, no stiffness, not tearing and not throbbing   Pain radiates to:  Does not radiate Pain severity:  Moderate Onset quality:  Gradual Duration:  24 hours Timing:  Constant Progression:  Worsening Chronicity:  Recurrent Context: eating   Context: not alcohol use, not diet changes, not laxative use, not medication withdrawal, not previous surgeries, not recent illness, not retching, not sick contacts, not suspicious food intake and not trauma   Relieved by:  Nothing Worsened by:  Nothing tried Ineffective treatments:  None tried Associated symptoms: fatigue and nausea   Associated symptoms: no anorexia, no chest pain, no cough, no diarrhea, no dysuria, no  fever, no hematemesis, no hematochezia, no hematuria, no shortness of breath, no vaginal bleeding, no vaginal discharge and no vomiting   Risk factors: obesity   Headache Associated symptoms: abdominal pain, fatigue and nausea   Associated symptoms: no cough, no diarrhea, no fever and no vomiting     Past Medical History  Diagnosis Date  . Mononeuritis multiplex   . Unspecified hereditary and idiopathic peripheral neuropathy   . Hearing loss   . Rash   . Joint pain   . Ischemic bowel disease   . Cyst   . Wegener's granulomatosis   . Nasal bleeding   . Hypertension   . Hypothyroidism   . Anxiety     generalized   Past Surgical History  Procedure Laterality Date  . Tubal ligation  1992   Family History  Problem Relation Age of Onset  . Diabetes Mother   . Hypertension Mother   . Breast cancer Mother   . Diabetes Sister   . Hypertension Sister    History  Substance Use Topics  . Smoking status: Former Smoker    Quit date: 05/06/2007  . Smokeless tobacco: Never Used  . Alcohol Use: No   OB History   Grav Para Term Preterm Abortions TAB SAB Ect Mult Living                 Review of Systems  Constitutional: Positive for fatigue. Negative for fever.  Respiratory: Negative for cough and shortness of breath.   Cardiovascular: Negative for chest pain.  Gastrointestinal: Positive for nausea and abdominal pain. Negative for vomiting, diarrhea, hematochezia, anorexia and hematemesis.  Genitourinary: Negative  for dysuria, hematuria, vaginal bleeding and vaginal discharge.  Neurological: Positive for headaches.  All other systems reviewed and are negative.    Allergies  Review of patient's allergies indicates no known allergies.  Home Medications   Current Outpatient Rx  Name  Route  Sig  Dispense  Refill  . azaTHIOprine (IMURAN) 50 MG tablet   Oral   Take 100 mg by mouth 2 (two) times daily.         . Estradiol-Norethindrone Acet 0.5-0.1 MG per tablet       TAKE 1 TABLET BY MOUTH DAILY   28 tablet   0     Overdue for annual exam in June.  Please call to s ...   . famotidine (PEPCID) 20 MG tablet   Oral   Take 1 tablet (20 mg total) by mouth 2 (two) times daily.   30 tablet   0   . fentaNYL (DURAGESIC - DOSED MCG/HR) 50 MCG/HR   Transdermal   Place 1 patch onto the skin every 3 (three) days.         Marland Kitchen lisinopril (PRINIVIL,ZESTRIL) 20 MG tablet   Oral   Take 20 mg by mouth daily.         Marland Kitchen oxyCODONE-acetaminophen (PERCOCET/ROXICET) 5-325 MG per tablet   Oral   Take 2 tablets by mouth every 4 (four) hours as needed for pain.   6 tablet   0   . predniSONE (DELTASONE) 20 MG tablet   Oral   Take 40 mg by mouth 2 (two) times daily.         Marland Kitchen zolpidem (AMBIEN) 10 MG tablet   Oral   Take 10 mg by mouth at bedtime as needed. For sleep          BP 91/63  Pulse 80  Temp(Src) 97.7 F (36.5 C) (Oral)  Resp 17  SpO2 100% Physical Exam  Nursing note and vitals reviewed. Constitutional: She is oriented to person, place, and time. She appears well-developed and well-nourished. No distress.  HENT:  Head: Normocephalic and atraumatic.  Right Ear: External ear normal.  Left Ear: External ear normal.  Nose: Nose normal.  Mouth/Throat: Oropharynx is clear and moist. No oropharyngeal exudate.  Pain with palpation of temporal arteries, no firm cording.  Eyes: Conjunctivae are normal. Pupils are equal, round, and reactive to light. No scleral icterus.  Neck: Normal range of motion. Neck supple.  Cardiovascular: Normal rate, regular rhythm and normal heart sounds.  Exam reveals no gallop and no friction rub.   No murmur heard. Pulmonary/Chest: Effort normal and breath sounds normal. No respiratory distress. She has no wheezes. She has no rales. She exhibits no tenderness.  Abdominal: Soft. Bowel sounds are normal. She exhibits no distension and no mass. There is no tenderness. There is no rebound and no guarding.  No worsening  tender ness with palpation -   Musculoskeletal: Normal range of motion. She exhibits no edema and no tenderness.  Lymphadenopathy:    She has no cervical adenopathy.  Neurological: She is alert and oriented to person, place, and time. No cranial nerve deficit. She exhibits normal muscle tone. Coordination normal.  Skin: Skin is warm and dry. No rash noted. No erythema. No pallor.  Psychiatric: She has a normal mood and affect. Her behavior is normal. Judgment and thought content normal.    ED Course  Procedures (including critical care time) Labs Review Labs Reviewed  CBC WITH DIFFERENTIAL - Abnormal; Notable for the following:  Hemoglobin 11.4 (*)    HCT 35.1 (*)    MCV 77.7 (*)    MCH 25.2 (*)    All other components within normal limits  URINALYSIS, ROUTINE W REFLEX MICROSCOPIC - Abnormal; Notable for the following:    Specific Gravity, Urine 1.031 (*)    Protein, ur 30 (*)    All other components within normal limits  URINE MICROSCOPIC-ADD ON  COMPREHENSIVE METABOLIC PANEL  LIPASE, BLOOD  TROPONIN I  POCT PREGNANCY, URINE   Results for orders placed during the hospital encounter of 11/09/12  CBC WITH DIFFERENTIAL      Result Value Range   WBC 7.2  4.0 - 10.5 K/uL   RBC 4.52  3.87 - 5.11 MIL/uL   Hemoglobin 11.4 (*) 12.0 - 15.0 g/dL   HCT 14.7 (*) 82.9 - 56.2 %   MCV 77.7 (*) 78.0 - 100.0 fL   MCH 25.2 (*) 26.0 - 34.0 pg   MCHC 32.5  30.0 - 36.0 g/dL   RDW 13.0  86.5 - 78.4 %   Platelets 368  150 - 400 K/uL   Neutrophils Relative % 60  43 - 77 %   Neutro Abs 4.3  1.7 - 7.7 K/uL   Lymphocytes Relative 26  12 - 46 %   Lymphs Abs 1.9  0.7 - 4.0 K/uL   Monocytes Relative 9  3 - 12 %   Monocytes Absolute 0.6  0.1 - 1.0 K/uL   Eosinophils Relative 5  0 - 5 %   Eosinophils Absolute 0.4  0.0 - 0.7 K/uL   Basophils Relative 0  0 - 1 %   Basophils Absolute 0.0  0.0 - 0.1 K/uL  COMPREHENSIVE METABOLIC PANEL      Result Value Range   Sodium 134 (*) 135 - 145 mEq/L    Potassium 4.1  3.5 - 5.1 mEq/L   Chloride 99  96 - 112 mEq/L   CO2 27  19 - 32 mEq/L   Glucose, Bld 95  70 - 99 mg/dL   BUN 7  6 - 23 mg/dL   Creatinine, Ser 6.96  0.50 - 1.10 mg/dL   Calcium 9.4  8.4 - 29.5 mg/dL   Total Protein 7.1  6.0 - 8.3 g/dL   Albumin 3.4 (*) 3.5 - 5.2 g/dL   AST 21  0 - 37 U/L   ALT 12  0 - 35 U/L   Alkaline Phosphatase 91  39 - 117 U/L   Total Bilirubin 0.3  0.3 - 1.2 mg/dL   GFR calc non Af Amer >90  >90 mL/min   GFR calc Af Amer >90  >90 mL/min  LIPASE, BLOOD      Result Value Range   Lipase 42  11 - 59 U/L  URINALYSIS, ROUTINE W REFLEX MICROSCOPIC      Result Value Range   Color, Urine YELLOW  YELLOW   APPearance CLEAR  CLEAR   Specific Gravity, Urine 1.031 (*) 1.005 - 1.030   pH 8.0  5.0 - 8.0   Glucose, UA NEGATIVE  NEGATIVE mg/dL   Hgb urine dipstick NEGATIVE  NEGATIVE   Bilirubin Urine NEGATIVE  NEGATIVE   Ketones, ur NEGATIVE  NEGATIVE mg/dL   Protein, ur 30 (*) NEGATIVE mg/dL   Urobilinogen, UA 1.0  0.0 - 1.0 mg/dL   Nitrite NEGATIVE  NEGATIVE   Leukocytes, UA NEGATIVE  NEGATIVE  TROPONIN I      Result Value Range   Troponin I <0.30  <0.30  ng/mL  URINE MICROSCOPIC-ADD ON      Result Value Range   Squamous Epithelial / LPF RARE  RARE   WBC, UA 0-2  <3 WBC/hpf   Bacteria, UA RARE  RARE  POCT PREGNANCY, URINE      Result Value Range   Preg Test, Ur NEGATIVE  NEGATIVE   Dg Chest 2 View  11/09/2012   CLINICAL DATA:  Abdominal pain, hypertension.  EXAM: CHEST  2 VIEW  COMPARISON:  03/15/2012  FINDINGS: Slight peribronchial thickening. Heart and mediastinal contours are within normal limits. No focal opacities or effusions. No acute bony abnormality.  IMPRESSION: Slight bronchitic changes.   Electronically Signed   By: Charlett Nose M.D.   On: 11/09/2012 13:48     Imaging Review No results found.  MDM  Wegener's Flare  Patient here with likely flare of Wegener's who reports pain to bilateral temples and epigastric region which is  usual for her.  Labs are normal and I dont this this warrants any further imaging - patient would like to be discharged and will call her rheumatologist this afternoon.   Izola Price Marisue Humble, [place] 11/09/12 1548

## 2012-11-12 ENCOUNTER — Emergency Department (HOSPITAL_COMMUNITY): Payer: Managed Care, Other (non HMO)

## 2012-11-12 ENCOUNTER — Encounter (HOSPITAL_COMMUNITY): Payer: Self-pay | Admitting: *Deleted

## 2012-11-12 ENCOUNTER — Emergency Department (HOSPITAL_COMMUNITY)
Admission: EM | Admit: 2012-11-12 | Discharge: 2012-11-13 | Disposition: A | Discharge status: Home / Self Care | Payer: Managed Care, Other (non HMO) | Attending: Emergency Medicine | Admitting: Emergency Medicine

## 2012-11-12 DIAGNOSIS — R1013 Epigastric pain: Secondary | ICD-10-CM | POA: Insufficient documentation

## 2012-11-12 DIAGNOSIS — Z8679 Personal history of other diseases of the circulatory system: Secondary | ICD-10-CM | POA: Insufficient documentation

## 2012-11-12 DIAGNOSIS — Z79899 Other long term (current) drug therapy: Secondary | ICD-10-CM | POA: Insufficient documentation

## 2012-11-12 DIAGNOSIS — Z8669 Personal history of other diseases of the nervous system and sense organs: Secondary | ICD-10-CM | POA: Insufficient documentation

## 2012-11-12 DIAGNOSIS — E039 Hypothyroidism, unspecified: Secondary | ICD-10-CM | POA: Insufficient documentation

## 2012-11-12 DIAGNOSIS — Z8719 Personal history of other diseases of the digestive system: Secondary | ICD-10-CM | POA: Insufficient documentation

## 2012-11-12 DIAGNOSIS — Z87891 Personal history of nicotine dependence: Secondary | ICD-10-CM | POA: Insufficient documentation

## 2012-11-12 DIAGNOSIS — R51 Headache: Secondary | ICD-10-CM | POA: Insufficient documentation

## 2012-11-12 DIAGNOSIS — F411 Generalized anxiety disorder: Secondary | ICD-10-CM | POA: Insufficient documentation

## 2012-11-12 DIAGNOSIS — Z8739 Personal history of other diseases of the musculoskeletal system and connective tissue: Secondary | ICD-10-CM | POA: Insufficient documentation

## 2012-11-12 DIAGNOSIS — I10 Essential (primary) hypertension: Secondary | ICD-10-CM | POA: Insufficient documentation

## 2012-11-12 DIAGNOSIS — Z872 Personal history of diseases of the skin and subcutaneous tissue: Secondary | ICD-10-CM | POA: Insufficient documentation

## 2012-11-12 LAB — URINALYSIS, ROUTINE W REFLEX MICROSCOPIC
Leukocytes, UA: NEGATIVE
Nitrite: NEGATIVE
Protein, ur: 100 mg/dL — AB
Specific Gravity, Urine: 1.038 — ABNORMAL HIGH (ref 1.005–1.030)
Urobilinogen, UA: 2 mg/dL — ABNORMAL HIGH (ref 0.0–1.0)

## 2012-11-12 LAB — CBC WITH DIFFERENTIAL/PLATELET
Eosinophils Absolute: 0 10*3/uL (ref 0.0–0.7)
Eosinophils Relative: 0 % (ref 0–5)
HCT: 40 % (ref 36.0–46.0)
Lymphocytes Relative: 11 % — ABNORMAL LOW (ref 12–46)
Lymphs Abs: 1.6 10*3/uL (ref 0.7–4.0)
MCH: 26 pg (ref 26.0–34.0)
MCV: 76.9 fL — ABNORMAL LOW (ref 78.0–100.0)
Monocytes Absolute: 0.9 10*3/uL (ref 0.1–1.0)
Platelets: 435 10*3/uL — ABNORMAL HIGH (ref 150–400)
RBC: 5.2 MIL/uL — ABNORMAL HIGH (ref 3.87–5.11)
RDW: 15.5 % (ref 11.5–15.5)

## 2012-11-12 LAB — COMPREHENSIVE METABOLIC PANEL
ALT: 9 U/L (ref 0–35)
CO2: 21 mEq/L (ref 19–32)
Calcium: 10.1 mg/dL (ref 8.4–10.5)
Creatinine, Ser: 0.62 mg/dL (ref 0.50–1.10)
GFR calc Af Amer: >90 mL/min (ref >90)
GFR calc non Af Amer: >90 mL/min (ref >90)
Glucose, Bld: 101 mg/dL — ABNORMAL HIGH (ref 70–99)
Sodium: 136 mEq/L (ref 135–145)
Total Protein: 8.5 g/dL — ABNORMAL HIGH (ref 6.0–8.3)

## 2012-11-12 LAB — URINE MICROSCOPIC-ADD ON

## 2012-11-12 LAB — POCT I-STAT TROPONIN I: Troponin i, poc: 0 ng/mL (ref 0.00–0.08)

## 2012-11-12 LAB — OCCULT BLOOD, POC DEVICE: Fecal Occult Bld: NEGATIVE

## 2012-11-12 LAB — LIPASE, BLOOD: Lipase: 30 U/L (ref 11–59)

## 2012-11-12 MED ORDER — FAMOTIDINE IN NACL 20-0.9 MG/50ML-% IV SOLN
20.0000 mg | Freq: Once | INTRAVENOUS | Status: AC
  Administered 2012-11-12: 20 mg via INTRAVENOUS
  Filled 2012-11-12: qty 50

## 2012-11-12 MED ORDER — ONDANSETRON HCL 4 MG/2ML IJ SOLN
4.0000 mg | Freq: Once | INTRAMUSCULAR | Status: AC
  Administered 2012-11-12: 4 mg via INTRAVENOUS
  Filled 2012-11-12: qty 2

## 2012-11-12 MED ORDER — IOHEXOL 300 MG/ML  SOLN
100.0000 mL | Freq: Once | INTRAMUSCULAR | Status: AC | PRN
  Administered 2012-11-12: 100 mL via INTRAVENOUS

## 2012-11-12 MED ORDER — IOHEXOL 300 MG/ML  SOLN
25.0000 mL | Freq: Once | INTRAMUSCULAR | Status: AC | PRN
  Administered 2012-11-12: 25 mL via ORAL

## 2012-11-12 MED ORDER — GI COCKTAIL ~~LOC~~
30.0000 mL | Freq: Once | ORAL | Status: AC
  Administered 2012-11-12: 30 mL via ORAL
  Filled 2012-11-12: qty 30

## 2012-11-12 MED ORDER — MORPHINE SULFATE 4 MG/ML IJ SOLN
4.0000 mg | Freq: Once | INTRAMUSCULAR | Status: AC
  Administered 2012-11-12: 4 mg via INTRAVENOUS
  Filled 2012-11-12: qty 1

## 2012-11-12 NOTE — ED Provider Notes (Signed)
CSN: [id number]     Arrival date & time 11/12/12  1548 History   First MD Initiated Contact with Patient 11/12/12 1827     Chief Complaint  Patient presents with  . Abdominal Pain  . Headache   (Consider location/radiation/quality/duration/timing/severity/associated sxs/prior Treatment) HPI  Laura [name] is a 47 y.o. female with past medical history significant for Wegener's granulomatosis, ischemic bowel disease, mononeuritis multiplex complaining of 3 days of headache and epigastric abdominal pain. Patient states the abdominal pain is what bothers her most, is rated at 8/10, it is colicky in nature and not relieved with Vicodin. Patient states the headache and the abdominal pain occurred the same time. She describes the headache as 7/10 in the bilateral frontal areas, no exacerbating or alleviating factors identified. She denies any change in her vision, dysarthria, cervicalgia, fever, focal weakness, dysarthria or ataxia. Patient denies fever, cough, shortness of breath, nausea vomiting diarrhea, dysuria or any other change in urination. She states that the abdominal pain is consistent with prior episodes of Wegener's exacerbation. States that she has tried to get in touch with her rheumatologist but is not able to do so.  Past Medical History  Diagnosis Date  . Mononeuritis multiplex   . Unspecified hereditary and idiopathic peripheral neuropathy   . Hearing loss   . Rash   . Joint pain   . Ischemic bowel disease   . Cyst   . Wegener's granulomatosis   . Nasal bleeding   . Hypertension   . Hypothyroidism   . Anxiety     generalized   Past Surgical History  Procedure Laterality Date  . Tubal ligation  1992   Family History  Problem Relation Age of Onset  . Diabetes Mother   . Hypertension Mother   . Breast cancer Mother   . Diabetes Sister   . Hypertension Sister    History  Substance Use Topics  . Smoking status: Former Smoker    Quit date: 05/06/2007  .  Smokeless tobacco: Never Used  . Alcohol Use: No   OB History   Grav Para Term Preterm Abortions TAB SAB Ect Mult Living                 Review of Systems 10 systems reviewed and found to be negative, except as noted in the HPI   Allergies  Review of patient's allergies indicates no known allergies.  Home Medications   Current Outpatient Rx  Name  Route  Sig  Dispense  Refill  . azaTHIOprine (IMURAN) 50 MG tablet   Oral   Take 100 mg by mouth 2 (two) times daily.         Marland Kitchen estradiol-norethindrone (ACTIVELLA) 1-0.5 MG per tablet   Oral   Take 1 tablet by mouth daily.         . famotidine (PEPCID) 20 MG tablet   Oral   Take 1 tablet (20 mg total) by mouth 2 (two) times daily.   30 tablet   0   . HYDROcodone-acetaminophen (NORCO/VICODIN) 5-325 MG per tablet   Oral   Take 2 tablets by mouth every 6 (six) hours as needed for pain.         Marland Kitchen lisinopril (PRINIVIL,ZESTRIL) 20 MG tablet   Oral   Take 20 mg by mouth daily.         . predniSONE (DELTASONE) 20 MG tablet   Oral   Take 40 mg by mouth 2 (two) times daily.         Marland Kitchen  promethazine (PHENERGAN) 25 MG tablet   Oral   Take 1 tablet (25 mg total) by mouth every 6 (six) hours as needed for nausea.   30 tablet   0   . zolpidem (AMBIEN) 10 MG tablet   Oral   Take 10 mg by mouth at bedtime as needed. For sleep          BP 151/78  Pulse 75  Temp(Src) 97.8 F (36.6 C) (Oral)  Resp 18  SpO2 99% Physical Exam  Nursing note and vitals reviewed. Constitutional: She is oriented to person, place, and time. She appears well-developed and well-nourished. No distress.  HENT:  Head: Normocephalic.  Mouth/Throat: Oropharynx is clear and moist.  Eyes: Conjunctivae and EOM are normal. Pupils are equal, round, and reactive to light.  Neck: Normal range of motion.  No midline tenderness to palpation or step-offs appreciated. Patient has full range of motion without pain.  No tenderness to deep palpation of the  posterior cervical spine. Patient can flex chin to chest with no pain.    Cardiovascular: Normal rate, regular rhythm and intact distal pulses.   Pulmonary/Chest: Effort normal and breath sounds normal. No stridor.  Abdominal: Soft. Bowel sounds are normal. She exhibits no mass. There is no tenderness. There is no rebound and no guarding.  Musculoskeletal: Normal range of motion.  Neurological: She is alert and oriented to person, place, and time.  Follows commands, Goal oriented speech, Strength is 5 out of 5x4 extremities, patient ambulates with a coordinated in nonantalgic gait. Sensation is grossly intact.   Psychiatric: She has a normal mood and affect.    ED Course  Procedures (including critical care time) Labs Review Labs Reviewed  CBC WITH DIFFERENTIAL - Abnormal; Notable for the following:    WBC 14.8 (*)    RBC 5.20 (*)    MCV 76.9 (*)    Platelets 435 (*)    Neutrophils Relative % 83 (*)    Neutro Abs 12.3 (*)    Lymphocytes Relative 11 (*)    All other components within normal limits  COMPREHENSIVE METABOLIC PANEL - Abnormal; Notable for the following:    Glucose, Bld 101 (*)    Total Protein 8.5 (*)    All other components within normal limits  URINALYSIS, ROUTINE W REFLEX MICROSCOPIC - Abnormal; Notable for the following:    Color, Urine AMBER (*)    APPearance CLOUDY (*)    Specific Gravity, Urine 1.038 (*)    Bilirubin Urine SMALL (*)    Ketones, ur 15 (*)    Protein, ur 100 (*)    Urobilinogen, UA 2.0 (*)    All other components within normal limits  URINE MICROSCOPIC-ADD ON - Abnormal; Notable for the following:    Squamous Epithelial / LPF MANY (*)    All other components within normal limits  LIPASE, BLOOD  POCT I-STAT TROPONIN I   Imaging Review Dg Chest 2 View  11/12/2012   CLINICAL DATA:  Chest pain.  EXAM: CHEST  2 VIEW  COMPARISON:  11/09/2012  FINDINGS: The heart size and mediastinal contours are within normal limits. Both lungs are clear. The  visualized skeletal structures are unremarkable.  IMPRESSION: No active cardiopulmonary disease.   Electronically Signed   By: Richarda Overlie M.D.   On: 11/12/2012 19:03    MDM  No diagnosis found.  Filed Vitals:   11/12/12 1551 11/12/12 1858  BP: 151/78 143/77  Pulse: 75   Temp: 97.8 F (36.6 C)  TempSrc: Oral   Resp: 18 20  SpO2: 99% 99%     Laura [name] is a 47 y.o. female with headache and epigastric pain, seen for similar 2 days ago. Patient had normal blood work at that time. Today she has a leukocytosis of 14.8. She is afebrile, nonseptic appearing. Patient has a history of ischemic bowel, and  pain out of proportion to exam CT abdomen pelvis and guaiac are ordered.   Patient also has headache, no red flags for subarachnoid, meningitis or temporal arteritis, no nuchal rigidity or focal neuro deficits, change in vision.  Case signed out to PA Leander at shift change.  Medications  famotidine (PEPCID) IVPB 20 mg (not administered)  gi cocktail (Maalox,Lidocaine,Donnatal) (not administered)  iohexol (OMNIPAQUE) 300 MG/ML solution 25 mL (25 mLs Oral Contrast Given 11/12/12 1939)   Note: Portions of this report may have been transcribed using voice recognition software. Every effort was made to ensure accuracy; however, inadvertent computerized transcription errors may be present      Kieley Akter, PA-C 11/12/12 2014

## 2012-11-12 NOTE — ED Notes (Signed)
Pt reports having mid abd pain x 2 days with headache and nausea, no vomiting. Last bm was yesterday. Pt was seen here on 9/15 for same, no relief with vicodin. No acute distress noted at triage.

## 2012-11-12 NOTE — ED Provider Notes (Signed)
Care report received from [facility], PA-C at beginning of shift.  Pt with hx of Wegener Granulomatosis presents with abd pain.  Does have elevated WBC of 14.  Currently awaits Guaic test and abd CT scan to r/o ischemic bowel.    12:40 AM Hemoccult negative, abd/pelvic CT without acute finding.  Pt's symptoms has improved.  Pt agrees to f/u with her PCP for further care.    BP 147/79  Pulse 65  Temp(Src) 98.8 F (37.1 C) (Oral)  Resp 20  SpO2 100%  I have reviewed nursing notes and vital signs. I personally reviewed the imaging tests through PACS system  I reviewed available ER/hospitalization records thought the EMR  Results for orders placed during the hospital encounter of 11/12/12  CBC WITH DIFFERENTIAL      Result Value Range   WBC 14.8 (*) 4.0 - 10.5 K/uL   RBC 5.20 (*) 3.87 - 5.11 MIL/uL   Hemoglobin 13.5  12.0 - 15.0 g/dL   HCT 16.1  09.6 - 04.5 %   MCV 76.9 (*) 78.0 - 100.0 fL   MCH 26.0  26.0 - 34.0 pg   MCHC 33.8  30.0 - 36.0 g/dL   RDW 40.9  81.1 - 91.4 %   Platelets 435 (*) 150 - 400 K/uL   Neutrophils Relative % 83 (*) 43 - 77 %   Neutro Abs 12.3 (*) 1.7 - 7.7 K/uL   Lymphocytes Relative 11 (*) 12 - 46 %   Lymphs Abs 1.6  0.7 - 4.0 K/uL   Monocytes Relative 6  3 - 12 %   Monocytes Absolute 0.9  0.1 - 1.0 K/uL   Eosinophils Relative 0  0 - 5 %   Eosinophils Absolute 0.0  0.0 - 0.7 K/uL   Basophils Relative 0  0 - 1 %   Basophils Absolute 0.0  0.0 - 0.1 K/uL  COMPREHENSIVE METABOLIC PANEL      Result Value Range   Sodium 136  135 - 145 mEq/L   Potassium 4.0  3.5 - 5.1 mEq/L   Chloride 100  96 - 112 mEq/L   CO2 21  19 - 32 mEq/L   Glucose, Bld 101 (*) 70 - 99 mg/dL   BUN 9  6 - 23 mg/dL   Creatinine, Ser 7.82  0.50 - 1.10 mg/dL   Calcium 95.6  8.4 - 21.3 mg/dL   Total Protein 8.5 (*) 6.0 - 8.3 g/dL   Albumin 4.0  3.5 - 5.2 g/dL   AST 17  0 - 37 U/L   ALT 9  0 - 35 U/L   Alkaline Phosphatase 100  39 - 117 U/L   Total Bilirubin 0.3  0.3 - 1.2 mg/dL   GFR  calc non Af Amer >90  >90 mL/min   GFR calc Af Amer >90  >90 mL/min  LIPASE, BLOOD      Result Value Range   Lipase 30  11 - 59 U/L  URINALYSIS, ROUTINE W REFLEX MICROSCOPIC      Result Value Range   Color, Urine AMBER (*) YELLOW   APPearance CLOUDY (*) CLEAR   Specific Gravity, Urine 1.038 (*) 1.005 - 1.030   pH 7.0  5.0 - 8.0   Glucose, UA NEGATIVE  NEGATIVE mg/dL   Hgb urine dipstick NEGATIVE  NEGATIVE   Bilirubin Urine SMALL (*) NEGATIVE   Ketones, ur 15 (*) NEGATIVE mg/dL   Protein, ur 086 (*) NEGATIVE mg/dL   Urobilinogen, UA 2.0 (*) 0.0 -  1.0 mg/dL   Nitrite NEGATIVE  NEGATIVE   Leukocytes, UA NEGATIVE  NEGATIVE  URINE MICROSCOPIC-ADD ON      Result Value Range   Squamous Epithelial / LPF MANY (*) RARE   WBC, UA 0-2  <3 WBC/hpf   RBC / HPF 0-2  <3 RBC/hpf   Bacteria, UA RARE  RARE   Urine-Other MUCOUS PRESENT    POCT I-STAT TROPONIN I      Result Value Range   Troponin i, poc 0.00  0.00 - 0.08 ng/mL   Comment 3           OCCULT BLOOD, POC DEVICE      Result Value Range   Fecal Occult Bld NEGATIVE  NEGATIVE  POCT I-STAT TROPONIN I      Result Value Range   Troponin i, poc 0.00  0.00 - 0.08 ng/mL   Comment 3            Dg Chest 2 View  11/12/2012   CLINICAL DATA:  Chest pain.  EXAM: CHEST  2 VIEW  COMPARISON:  11/09/2012  FINDINGS: The heart size and mediastinal contours are within normal limits. Both lungs are clear. The visualized skeletal structures are unremarkable.  IMPRESSION: No active cardiopulmonary disease.   Electronically Signed   By: Richarda Overlie M.D.   On: 11/12/2012 19:03   Dg Chest 2 View  11/09/2012   CLINICAL DATA:  Abdominal pain, hypertension.  EXAM: CHEST  2 VIEW  COMPARISON:  03/15/2012  FINDINGS: Slight peribronchial thickening. Heart and mediastinal contours are within normal limits. No focal opacities or effusions. No acute bony abnormality.  IMPRESSION: Slight bronchitic changes.   Electronically Signed   By: Charlett Nose M.D.   On: 11/09/2012  13:48   Ct Abdomen Pelvis W Contrast  11/13/2012   *RADIOLOGY REPORT*  Clinical Data: Pain  CT ABDOMEN AND PELVIS WITH CONTRAST  Technique:  Multidetector CT imaging of the abdomen and pelvis was performed following the standard protocol during bolus administration of intravenous contrast.  Contrast: OMNIPAQUE IOHEXOL 300 MG/ML  SOLN  Comparison: Prior ultrasound from 04/04/2010  Findings: Mild dependent atelectasis is present within the lung bases.  The liver is unremarkable.  Gallbladder is within normal limits. No biliary ductal dilatation.  The spleen demonstrates a somewhat lobulated contour but is otherwise unremarkable.  Adrenal glands and pancreas demonstrate a normal contrast enhanced appearance.  No pancreatic ductal dilatation.  The kidneys are within normal limits with symmetric enhancement. No nephrolithiasis, hydronephrosis, or focal renal mass.  The stomach is normal.  There is no evidence of bowel obstruction. Appendix is well visualized in the right lower quadrant and is of normal caliber and appearance without associate inflammatory changes.  No abnormal wall thickening enhancement is seen about the bowels to suggest underlying inflammation.  Bladder is unremarkable.  Uterus and ovaries are within normal limits.  No free air or fluid.  No pathologically enlarged intra-abdominal pelvic lymph nodes are seen.  Scattered calcified atheromatous disease is seen within the intra- abdominal aorta and its branch vessels.  No acute osseous abnormality identified.  IMPRESSION: No CT evidence of acute intra-abdominal pelvic process.   Original Report Authenticated By: Rise Mu, M.D.    Medications  famotidine (PEPCID) IVPB 20 mg (20 mg Intravenous New Bag/Given 11/12/12 2258)  gi cocktail (Maalox,Lidocaine,Donnatal) (30 mLs Oral Given 11/12/12 2300)  iohexol (OMNIPAQUE) 300 MG/ML solution 25 mL (25 mLs Oral Contrast Given 11/12/12 1939)  morphine 4 MG/ML injection 4 mg (  4 mg Intravenous  Given 11/12/12 2300)  ondansetron (ZOFRAN) injection 4 mg (4 mg Intravenous Given 11/12/12 2259)  iohexol (OMNIPAQUE) 300 MG/ML solution 100 mL (100 mLs Intravenous Contrast Given 11/12/12 2320)     Fayrene Helper, PA-C 11/13/12 0041

## 2012-11-12 NOTE — ED Notes (Signed)
Patient to ED with C/O abdominal pain and a headache that began 2 days ago. Denies fever, nausea, vomiting.  Abdominal pain is in her upper abdomen.  Headache is  bilateral frontal.

## 2012-11-13 LAB — POCT I-STAT TROPONIN I: Troponin i, poc: 0 ng/mL (ref 0.00–0.08)

## 2012-11-13 NOTE — ED Provider Notes (Signed)
Medical screening examination/treatment/procedure(s) were performed by non-physician practitioner and as supervising physician I was immediately available for consultation/collaboration.   Audree Camel, MD 11/13/12 [phone]

## 2012-11-13 NOTE — ED Provider Notes (Deleted)
Medical screening examination/treatment/procedure(s) were conducted as a shared visit with non-physician practitioner(s) and myself.  I personally evaluated the patient during the encounter  Donnetta Hutching, MD 11/14/12 [phone]

## 2012-12-01 ENCOUNTER — Encounter (HOSPITAL_COMMUNITY): Admission: RE | Admit: 2012-12-01 | Payer: Managed Care, Other (non HMO) | Source: Ambulatory Visit

## 2012-12-07 ENCOUNTER — Other Ambulatory Visit (HOSPITAL_COMMUNITY): Payer: Self-pay | Admitting: Rheumatology

## 2012-12-07 ENCOUNTER — Encounter (HOSPITAL_COMMUNITY)
Admission: RE | Admit: 2012-12-07 | Discharge: 2012-12-07 | Disposition: A | Discharge status: Home / Self Care | Payer: Managed Care, Other (non HMO) | Source: Ambulatory Visit | Attending: Rheumatology | Admitting: Rheumatology

## 2012-12-07 ENCOUNTER — Encounter (HOSPITAL_COMMUNITY): Payer: Self-pay

## 2012-12-07 DIAGNOSIS — M3130 Wegener's granulomatosis without renal involvement: Secondary | ICD-10-CM | POA: Insufficient documentation

## 2012-12-07 MED ORDER — SODIUM CHLORIDE 0.9 % IV SOLN
INTRAVENOUS | Status: DC
  Administered 2012-12-07: 08:00:00 via INTRAVENOUS

## 2012-12-07 MED ORDER — SODIUM CHLORIDE 0.9 % IV SOLN
375.0000 mg/m2 | INTRAVENOUS | Status: DC
  Administered 2012-12-07: 700 mg via INTRAVENOUS
  Filled 2012-12-07: qty 70

## 2012-12-07 MED ORDER — METHYLPREDNISOLONE SODIUM SUCC 125 MG IJ SOLR
100.0000 mg | INTRAMUSCULAR | Status: DC
  Administered 2012-12-07: 100 mg via INTRAVENOUS
  Filled 2012-12-07: qty 1.6

## 2012-12-07 MED ORDER — ACETAMINOPHEN 325 MG PO TABS
650.0000 mg | ORAL_TABLET | ORAL | Status: DC
Start: 2012-12-07 — End: 2012-12-08
  Administered 2012-12-07: 650 mg via ORAL
  Filled 2012-12-07: qty 2

## 2012-12-14 ENCOUNTER — Ambulatory Visit (HOSPITAL_COMMUNITY)
Admission: RE | Admit: 2012-12-14 | Discharge: 2012-12-14 | Disposition: A | Discharge status: Home / Self Care | Payer: Managed Care, Other (non HMO) | Source: Ambulatory Visit | Attending: Rheumatology | Admitting: Rheumatology

## 2012-12-14 ENCOUNTER — Encounter (HOSPITAL_COMMUNITY): Payer: Self-pay

## 2012-12-14 DIAGNOSIS — M3130 Wegener's granulomatosis without renal involvement: Secondary | ICD-10-CM | POA: Insufficient documentation

## 2012-12-14 MED ORDER — SODIUM CHLORIDE 0.9 % IV SOLN
375.0000 mg/m2 | INTRAVENOUS | Status: DC
  Administered 2012-12-14: 700 mg via INTRAVENOUS
  Filled 2012-12-14: qty 70

## 2012-12-14 MED ORDER — SODIUM CHLORIDE 0.9 % IV SOLN
INTRAVENOUS | Status: DC
Start: 2012-12-14 — End: 2012-12-15
  Administered 2012-12-14: 09:00:00 via INTRAVENOUS

## 2012-12-14 MED ORDER — METHYLPREDNISOLONE SODIUM SUCC 125 MG IJ SOLR
100.0000 mg | INTRAMUSCULAR | Status: DC
  Administered 2012-12-14: 100 mg via INTRAVENOUS
  Filled 2012-12-14: qty 1.6

## 2012-12-14 MED ORDER — ACETAMINOPHEN 325 MG PO TABS
650.0000 mg | ORAL_TABLET | ORAL | Status: DC
  Administered 2012-12-14: 650 mg via ORAL
  Filled 2012-12-14: qty 2

## 2012-12-14 NOTE — Progress Notes (Signed)
Uneventful infusion of #2/4 infusion of weekly Rituxan

## 2012-12-15 ENCOUNTER — Other Ambulatory Visit: Payer: Self-pay | Admitting: Women's Health

## 2012-12-21 ENCOUNTER — Ambulatory Visit (HOSPITAL_COMMUNITY)
Admission: RE | Admit: 2012-12-21 | Discharge: 2012-12-21 | Disposition: A | Discharge status: Home / Self Care | Payer: Managed Care, Other (non HMO) | Source: Ambulatory Visit | Attending: Rheumatology | Admitting: Rheumatology

## 2012-12-21 ENCOUNTER — Encounter (HOSPITAL_COMMUNITY): Payer: Self-pay

## 2012-12-21 MED ORDER — METHYLPREDNISOLONE SODIUM SUCC 125 MG IJ SOLR
100.0000 mg | INTRAMUSCULAR | Status: DC
  Administered 2012-12-21: 100 mg via INTRAVENOUS
  Filled 2012-12-21: qty 1.6

## 2012-12-21 MED ORDER — SODIUM CHLORIDE 0.9 % IV SOLN
375.0000 mg/m2 | INTRAVENOUS | Status: DC
  Administered 2012-12-21: 700 mg via INTRAVENOUS
  Filled 2012-12-21: qty 70

## 2012-12-21 MED ORDER — SODIUM CHLORIDE 0.9 % IV SOLN
INTRAVENOUS | Status: DC
  Administered 2012-12-21: 250 mL via INTRAVENOUS

## 2012-12-21 MED ORDER — ACETAMINOPHEN 325 MG PO TABS
650.0000 mg | ORAL_TABLET | ORAL | Status: DC
  Administered 2012-12-21: 650 mg via ORAL
  Filled 2012-12-21: qty 2

## 2012-12-21 NOTE — Progress Notes (Signed)
Spoke to [employer].  No new labs needed at this time

## 2012-12-28 ENCOUNTER — Encounter (HOSPITAL_COMMUNITY): Payer: Self-pay

## 2012-12-28 ENCOUNTER — Ambulatory Visit (HOSPITAL_COMMUNITY)
Admission: RE | Admit: 2012-12-28 | Discharge: 2012-12-28 | Disposition: A | Discharge status: Home / Self Care | Payer: Managed Care, Other (non HMO) | Source: Ambulatory Visit | Attending: Rheumatology | Admitting: Rheumatology

## 2012-12-28 DIAGNOSIS — M3130 Wegener's granulomatosis without renal involvement: Secondary | ICD-10-CM | POA: Insufficient documentation

## 2012-12-28 MED ORDER — SODIUM CHLORIDE 0.9 % IV SOLN
375.0000 mg/m2 | INTRAVENOUS | Status: AC
  Administered 2012-12-28: 700 mg via INTRAVENOUS
  Filled 2012-12-28: qty 70

## 2012-12-28 MED ORDER — METHYLPREDNISOLONE SODIUM SUCC 125 MG IJ SOLR
100.0000 mg | INTRAMUSCULAR | Status: AC
  Administered 2012-12-28: 100 mg via INTRAVENOUS
  Filled 2012-12-28: qty 1.6

## 2012-12-28 MED ORDER — SODIUM CHLORIDE 0.9 % IV SOLN
INTRAVENOUS | Status: AC
  Administered 2012-12-28: 250 mL via INTRAVENOUS

## 2012-12-28 MED ORDER — ACETAMINOPHEN 325 MG PO TABS
650.0000 mg | ORAL_TABLET | ORAL | Status: AC
  Administered 2012-12-28: 650 mg via ORAL
  Filled 2012-12-28: qty 2

## 2012-12-28 NOTE — Progress Notes (Signed)
Uneventful infusion of #4/4 of Rituxan infusion.(one weekly x4)

## 2013-05-03 ENCOUNTER — Emergency Department (HOSPITAL_COMMUNITY)
Admission: EM | Admit: 2013-05-03 | Discharge: 2013-05-03 | Disposition: A | Discharge status: Home / Self Care | Payer: Managed Care, Other (non HMO) | Attending: Emergency Medicine | Admitting: Emergency Medicine

## 2013-05-03 ENCOUNTER — Encounter (HOSPITAL_COMMUNITY): Payer: Self-pay | Admitting: Emergency Medicine

## 2013-05-03 DIAGNOSIS — H9190 Unspecified hearing loss, unspecified ear: Secondary | ICD-10-CM | POA: Insufficient documentation

## 2013-05-03 DIAGNOSIS — R110 Nausea: Secondary | ICD-10-CM | POA: Insufficient documentation

## 2013-05-03 DIAGNOSIS — R109 Unspecified abdominal pain: Secondary | ICD-10-CM

## 2013-05-03 DIAGNOSIS — Z8639 Personal history of other endocrine, nutritional and metabolic disease: Secondary | ICD-10-CM | POA: Insufficient documentation

## 2013-05-03 DIAGNOSIS — Z9851 Tubal ligation status: Secondary | ICD-10-CM | POA: Insufficient documentation

## 2013-05-03 DIAGNOSIS — Z8719 Personal history of other diseases of the digestive system: Secondary | ICD-10-CM | POA: Insufficient documentation

## 2013-05-03 DIAGNOSIS — Z8659 Personal history of other mental and behavioral disorders: Secondary | ICD-10-CM | POA: Insufficient documentation

## 2013-05-03 DIAGNOSIS — Z79899 Other long term (current) drug therapy: Secondary | ICD-10-CM | POA: Insufficient documentation

## 2013-05-03 DIAGNOSIS — R51 Headache: Secondary | ICD-10-CM | POA: Insufficient documentation

## 2013-05-03 DIAGNOSIS — I10 Essential (primary) hypertension: Secondary | ICD-10-CM | POA: Insufficient documentation

## 2013-05-03 DIAGNOSIS — IMO0002 Reserved for concepts with insufficient information to code with codable children: Secondary | ICD-10-CM | POA: Insufficient documentation

## 2013-05-03 DIAGNOSIS — Z862 Personal history of diseases of the blood and blood-forming organs and certain disorders involving the immune mechanism: Secondary | ICD-10-CM | POA: Insufficient documentation

## 2013-05-03 DIAGNOSIS — R1013 Epigastric pain: Secondary | ICD-10-CM | POA: Insufficient documentation

## 2013-05-03 DIAGNOSIS — Z87891 Personal history of nicotine dependence: Secondary | ICD-10-CM | POA: Insufficient documentation

## 2013-05-03 LAB — CBC WITH DIFFERENTIAL/PLATELET
Basophils Absolute: 0 10*3/uL (ref 0.0–0.1)
Basophils Relative: 0 % (ref 0–1)
Eosinophils Absolute: 0.3 K/uL (ref 0.0–0.7)
Eosinophils Relative: 3 % (ref 0–5)
HCT: 37.7 % (ref 36.0–46.0)
Hemoglobin: 12.1 g/dL (ref 12.0–15.0)
Lymphocytes Relative: 31 % (ref 12–46)
Lymphs Abs: 2.5 10*3/uL (ref 0.7–4.0)
MCH: 25.3 pg — ABNORMAL LOW (ref 26.0–34.0)
MCHC: 32.1 g/dL (ref 30.0–36.0)
MCV: 78.9 fL (ref 78.0–100.0)
Monocytes Absolute: 0.9 10*3/uL (ref 0.1–1.0)
Monocytes Relative: 11 % (ref 3–12)
Neutro Abs: 4.3 K/uL (ref 1.7–7.7)
Neutrophils Relative %: 54 % (ref 43–77)
Platelets: 409 K/uL — ABNORMAL HIGH (ref 150–400)
RBC: 4.78 MIL/uL (ref 3.87–5.11)
RDW: 15.9 % — ABNORMAL HIGH (ref 11.5–15.5)
WBC: 8 10*3/uL (ref 4.0–10.5)

## 2013-05-03 LAB — COMPREHENSIVE METABOLIC PANEL WITH GFR
CO2: 28 meq/L (ref 19–32)
Creatinine, Ser: 0.75 mg/dL (ref 0.50–1.10)
GFR calc Af Amer: >90 mL/min (ref >90)
GFR calc non Af Amer: >90 mL/min (ref >90)
Glucose, Bld: 89 mg/dL (ref 70–99)
Total Bilirubin: 0.4 mg/dL (ref 0.3–1.2)

## 2013-05-03 LAB — URINALYSIS, ROUTINE W REFLEX MICROSCOPIC
Bilirubin Urine: NEGATIVE
Glucose, UA: NEGATIVE mg/dL
Hgb urine dipstick: NEGATIVE
Ketones, ur: 15 mg/dL — AB
Leukocytes, UA: NEGATIVE
Nitrite: NEGATIVE
Protein, ur: 30 mg/dL — AB
Specific Gravity, Urine: >1.03 — ABNORMAL HIGH (ref 1.005–1.030)
Urobilinogen, UA: 0.2 mg/dL (ref 0.0–1.0)
pH: 6 (ref 5.0–8.0)

## 2013-05-03 LAB — COMPREHENSIVE METABOLIC PANEL
ALT: 13 U/L (ref 0–35)
AST: 29 U/L (ref 0–37)
Albumin: 4 g/dL (ref 3.5–5.2)
Alkaline Phosphatase: 96 U/L (ref 39–117)
BUN: 11 mg/dL (ref 6–23)
Calcium: 10.2 mg/dL (ref 8.4–10.5)
Chloride: 99 mEq/L (ref 96–112)
Potassium: 4.1 mEq/L (ref 3.7–5.3)
Sodium: 140 mEq/L (ref 137–147)
Total Protein: 7.9 g/dL (ref 6.0–8.3)

## 2013-05-03 LAB — URINE MICROSCOPIC-ADD ON

## 2013-05-03 LAB — I-STAT TROPONIN, ED: Troponin i, poc: 0.01 ng/mL (ref 0.00–0.08)

## 2013-05-03 LAB — LIPASE, BLOOD: Lipase: 30 U/L (ref 11–59)

## 2013-05-03 MED ORDER — GI COCKTAIL ~~LOC~~
30.0000 mL | Freq: Once | ORAL | Status: AC
  Administered 2013-05-03: 30 mL via ORAL
  Filled 2013-05-03: qty 30

## 2013-05-03 MED ORDER — ONDANSETRON 8 MG PO TBDP: 8.0000 mg | ORAL_TABLET | Freq: Two times a day (BID) | ORAL | Status: DC | PRN

## 2013-05-03 MED ORDER — HYDROCODONE-ACETAMINOPHEN 5-325 MG PO TABS: ORAL_TABLET | ORAL | Status: DC

## 2013-05-03 MED ORDER — SODIUM CHLORIDE 0.9 % IV BOLUS (SEPSIS): 1000.0000 mL | Freq: Once | INTRAVENOUS | Status: DC

## 2013-05-03 MED ORDER — HYDROMORPHONE HCL PF 1 MG/ML IJ SOLN
1.0000 mg | Freq: Once | INTRAMUSCULAR | Status: DC
  Filled 2013-05-03: qty 1

## 2013-05-03 MED ORDER — ONDANSETRON HCL 4 MG/2ML IJ SOLN
4.0000 mg | Freq: Once | INTRAMUSCULAR | Status: DC
  Filled 2013-05-03: qty 2

## 2013-05-03 MED ORDER — ONDANSETRON 4 MG PO TBDP
8.0000 mg | ORAL_TABLET | Freq: Once | ORAL | Status: AC
  Administered 2013-05-03: 8 mg via ORAL
  Filled 2013-05-03: qty 2

## 2013-05-03 MED ORDER — HYDROMORPHONE HCL PF 1 MG/ML IJ SOLN
1.0000 mg | Freq: Once | INTRAMUSCULAR | Status: AC
  Administered 2013-05-03: 1 mg via INTRAMUSCULAR

## 2013-05-03 NOTE — ED Notes (Signed)
Pt has had some relief with GI cocktail.  Awaiting IV team to start IV so pt can have rest of medications.

## 2013-05-03 NOTE — Discharge Instructions (Signed)
Abdominal Pain, Adult Many things can cause abdominal pain. Usually, abdominal pain is not caused by a disease and will improve without treatment. It can often be observed and treated at home. Your health care provider will do a physical exam and possibly order blood tests and X-rays to help determine the seriousness of your pain. However, in many cases, more time must pass before a clear cause of the pain can be found. Before that point, your health care provider may not know if you need more testing or further treatment. HOME CARE INSTRUCTIONS  Monitor your abdominal pain for any changes. The following actions may help to alleviate any discomfort you are experiencing:  Only take over-the-counter or prescription medicines as directed by your health care provider.  Do not take laxatives unless directed to do so by your health care provider.  Try a clear liquid diet (broth, tea, or water) as directed by your health care provider. Slowly move to a bland diet as tolerated. SEEK MEDICAL CARE IF:  You have unexplained abdominal pain.  You have abdominal pain associated with nausea or diarrhea.  You have pain when you urinate or have a bowel movement.  You experience abdominal pain that wakes you in the night.  You have abdominal pain that is worsened or improved by eating food.  You have abdominal pain that is worsened with eating fatty foods. SEEK IMMEDIATE MEDICAL CARE IF:   Your pain does not go away within 2 hours.  You have a fever.  You keep throwing up (vomiting).  Your pain is felt only in portions of the abdomen, such as the right side or the left lower portion of the abdomen.  You pass bloody or black tarry stools. MAKE SURE YOU:  Understand these instructions.   Will watch your condition.   Will get help right away if you are not doing well or get worse.  Document Released: 11/21/2004 Document Revised: 12/02/2012 Document Reviewed: 10/21/2012 [facility] Patient  Information 2014 [employer] [place].   Narcotic and benzodiazepine use may cause drowsiness, slowed breathing or dependence.  Please use with caution and do not drive, operate machinery or watch young children alone while taking them.  Taking combinations of these medications or drinking alcohol will potentiate these effects.     Your labs were unremarkable.  Please call and make a follow up appointment for a recheck and re-examination of your abdomen by Dr. Clent RidgesWalsh later this week.  Return if symptoms suddenly worsen, develop persistent vomiting, high fevers, or any other significant concerns.

## 2013-05-03 NOTE — ED Notes (Signed)
2 attempts at iv by 2 rn's with no success. Dr Oletta Lamasghim notified.  Dilauded, zofan and gi cocktail given to rn barb.

## 2013-05-03 NOTE — ED Notes (Signed)
Pt states no appetite since 05-02-13 pm.  No N/V/D. No flank pain or back pain.

## 2013-05-03 NOTE — ED Notes (Signed)
Dr. Ghim at bedside. 

## 2013-05-03 NOTE — ED Notes (Signed)
Pt presents for abd pain and HA that both started last night. Denies any N/V/D. States the abd pain is mid upper abd pain.

## 2013-05-03 NOTE — ED Notes (Signed)
Pt able to keep ginger ale, water and crackers down.  Pain free.  Ready to go home now.

## 2013-05-03 NOTE — ED Provider Notes (Signed)
CSN: [id number]     Arrival date & time 05/03/13  1540 History   First MD Initiated Contact with Patient 05/03/13 1806     Chief Complaint  Patient presents with  . Abdominal Pain  . 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  Pt has had no change in usual habits, no new meds, no change in diet, has not had alcohol.  Pt tried pepto bismol with no relief.  No diarrhea, no blood in stool.    Patient is a 48 y.o. female presenting with abdominal pain and headaches. The history is provided by the patient.  Abdominal Pain Pain location:  Epigastric Pain quality: burning and cramping   Pain severity:  Moderate Onset quality:  Gradual Duration:  1 day Timing:  Constant Progression:  Unchanged Context: not alcohol use and not medication withdrawal   Associated symptoms: nausea   Associated symptoms: no chest pain, no chills, no constipation, no diarrhea, no dysuria, no fever and no vomiting   Risk factors: no alcohol abuse   Headache Associated symptoms: abdominal pain and nausea   Associated symptoms: no back pain, no diarrhea, no fever and no vomiting     Past Medical History  Diagnosis Date  . Mononeuritis multiplex   . Unspecified hereditary and idiopathic peripheral neuropathy   . Hearing loss   . Rash   . Joint pain   . Ischemic bowel disease   . Cyst   . Wegener's granulomatosis   . Nasal bleeding   . Hypertension   . Hypothyroidism   . Anxiety     generalized   Past Surgical History  Procedure Laterality Date  . Tubal ligation  1992   Family History  Problem Relation Age of Onset  . Diabetes Mother   . Hypertension Mother   . Breast cancer Mother   . Diabetes Sister   . Hypertension Sister     History  Substance Use Topics  . Smoking status: Former Smoker    Quit date: 05/06/2007  . Smokeless tobacco: Never Used  . Alcohol Use: No   OB History   Grav Para Term Preterm Abortions TAB SAB Ect Mult Living                 Review of Systems  Constitutional: Negative for fever and chills.  Cardiovascular: Negative for chest pain.  Gastrointestinal: Positive for nausea and abdominal pain. Negative for vomiting, diarrhea and constipation.  Endocrine: Negative for polydipsia and polyphagia.  Genitourinary: Negative for dysuria, flank pain and difficulty urinating.  Musculoskeletal: Negative for back pain.  Skin: Negative for color change and rash.  Neurological: Positive for headaches.  All other systems reviewed and are negative.      Allergies  Review of patient's allergies indicates no known allergies.  Home Medications   Current Outpatient Rx  Name  Route  Sig  Dispense  Refill  . azaTHIOprine (IMURAN) 50 MG tablet   Oral   Take 100 mg by mouth 2 (two) times daily.         . famotidine (PEPCID) 20 MG tablet   Oral   Take 1 tablet (20 mg total) by mouth 2 (two) times daily.   30 tablet   0   . fentaNYL (DURAGESIC - DOSED MCG/HR) 100 MCG/HR  Transdermal   Place 100 mcg onto the skin every 3 (three) days.         Marland Kitchen. lisinopril (PRINIVIL,ZESTRIL) 20 MG tablet   Oral   Take 20 mg by mouth daily.         . predniSONE (DELTASONE) 20 MG tablet   Oral   Take 40 mg by mouth 2 (two) times daily.         Marland Kitchen. zolpidem (AMBIEN) 10 MG tablet   Oral   Take 10 mg by mouth at bedtime as needed. For sleep         . HYDROcodone-acetaminophen (NORCO/VICODIN) 5-325 MG per tablet      1-2 tablets po q 6 hours prn moderate to severe pain   12 tablet   0   . ondansetron (ZOFRAN-ODT) 8 MG disintegrating tablet   Oral   Take 1 tablet (8 mg total) by mouth every 12 (twelve) hours as needed for nausea.   20 tablet   0    BP 108/75  Pulse 76  Temp(Src)  97.4 F (36.3 C) (Oral)  Resp 14  Ht 5\' 1"  (1.549 m)  Wt 175 lb (79.379 kg)  BMI 33.08 kg/m2  SpO2 99% Physical Exam  Nursing note and vitals reviewed. Constitutional: She is oriented to person, place, and time. She appears well-developed and well-nourished. No distress.  HENT:  Head: Normocephalic and atraumatic.  Eyes: Conjunctivae and EOM are normal. No scleral icterus.  Neck: Normal range of motion. Neck supple.  Cardiovascular: Normal rate, regular rhythm and intact distal pulses.   No murmur heard. Pulmonary/Chest: Effort normal. No respiratory distress.  Abdominal: Soft. She exhibits no distension. There is no tenderness. There is no rebound and no guarding.  No CVA tenderness  Neurological: She is alert and oriented to person, place, and time.  Skin: She is not diaphoretic.  Psychiatric: She has a normal mood and affect.    ED Course  Procedures (including critical care time) Labs Review Labs Reviewed  CBC WITH DIFFERENTIAL - Abnormal; Notable for the following:    MCH 25.3 (*)    RDW 15.9 (*)    Platelets 409 (*)    All other components within normal limits  URINALYSIS, ROUTINE W REFLEX MICROSCOPIC - Abnormal; Notable for the following:    Color, Urine AMBER (*)    APPearance HAZY (*)    Specific Gravity, Urine >1.030 (*)    Ketones, ur 15 (*)    Protein, ur 30 (*)    All other components within normal limits  URINE MICROSCOPIC-ADD ON - Abnormal; Notable for the following:    Squamous Epithelial / LPF FEW (*)    Bacteria, UA FEW (*)    Crystals CA OXALATE CRYSTALS (*)    All other components within normal limits  LIPASE, BLOOD  COMPREHENSIVE METABOLIC PANEL  I-STAT TROPOININ, ED   Imaging Review No results found.   EKG Interpretation   Date/Time:  Monday May 03 2013 15:49:52 EDT Ventricular Rate:  68 PR Interval:  146 QRS Duration: 86 QT Interval:  402 QTC Calculation: 427 R Axis:   -39 Text Interpretation:  Normal sinus rhythm Left axis  deviation Left  ventricular hypertrophy Abnormal ECG No significant change since last  tracing Confirmed by [facility] CenterGHIM  MD, MICHEAL ([id number]) on 05/03/2013 6:07:14 PM      RA sat is 100% and I interpret to be adequate  8:25 PM Not east IV stick .  I attempted EJ on both sides, saw flash  from angiocath, but unable to thread IV.  IV team at bedside, will give IM meds for now.     9:17 PM Pt is feelign improved already after oral meds and IM dilaudid, pain is down to 3/10.  Will give po challenge.  Pt prefers not to have an IV and is comfortable not having another CT scan since pain is improved.  Abd is soft, no guard or rebound.   10:34 PM Pt continues to feel well, desires to go home and I have encouraged her to follow up closely with PCP later this week MDM   Final diagnoses:  Abdominal pain    6:45 PM Pt with no sig tenderness on exam, labs are unremarkable, had a neg abd CT in September of 2014.  Plan to treat with IV meds, analgesics, zofran and GI cocktail and assess for improvement.  If symptoms are not improving, would consider CT scan.  Pt is comfortable with this plan.       Gavin Pound. Oletta Lamas, MD 05/03/13 2234

## 2013-06-19 ENCOUNTER — Encounter (HOSPITAL_COMMUNITY): Payer: Self-pay | Admitting: Emergency Medicine

## 2013-06-19 ENCOUNTER — Emergency Department (HOSPITAL_COMMUNITY)
Admission: EM | Admit: 2013-06-19 | Discharge: 2013-06-19 | Disposition: A | Discharge status: Home / Self Care | Payer: Managed Care, Other (non HMO) | Attending: Emergency Medicine | Admitting: Emergency Medicine

## 2013-06-19 DIAGNOSIS — I10 Essential (primary) hypertension: Secondary | ICD-10-CM | POA: Insufficient documentation

## 2013-06-19 DIAGNOSIS — Z9851 Tubal ligation status: Secondary | ICD-10-CM | POA: Insufficient documentation

## 2013-06-19 DIAGNOSIS — Z8639 Personal history of other endocrine, nutritional and metabolic disease: Secondary | ICD-10-CM | POA: Insufficient documentation

## 2013-06-19 DIAGNOSIS — Z79899 Other long term (current) drug therapy: Secondary | ICD-10-CM | POA: Insufficient documentation

## 2013-06-19 DIAGNOSIS — R519 Headache, unspecified: Secondary | ICD-10-CM

## 2013-06-19 DIAGNOSIS — R1084 Generalized abdominal pain: Secondary | ICD-10-CM | POA: Insufficient documentation

## 2013-06-19 DIAGNOSIS — R51 Headache: Secondary | ICD-10-CM | POA: Insufficient documentation

## 2013-06-19 DIAGNOSIS — Z87891 Personal history of nicotine dependence: Secondary | ICD-10-CM | POA: Insufficient documentation

## 2013-06-19 DIAGNOSIS — Z8719 Personal history of other diseases of the digestive system: Secondary | ICD-10-CM | POA: Insufficient documentation

## 2013-06-19 DIAGNOSIS — IMO0002 Reserved for concepts with insufficient information to code with codable children: Secondary | ICD-10-CM | POA: Insufficient documentation

## 2013-06-19 DIAGNOSIS — Z862 Personal history of diseases of the blood and blood-forming organs and certain disorders involving the immune mechanism: Secondary | ICD-10-CM | POA: Insufficient documentation

## 2013-06-19 DIAGNOSIS — Z8659 Personal history of other mental and behavioral disorders: Secondary | ICD-10-CM | POA: Insufficient documentation

## 2013-06-19 DIAGNOSIS — R109 Unspecified abdominal pain: Secondary | ICD-10-CM

## 2013-06-19 DIAGNOSIS — H9190 Unspecified hearing loss, unspecified ear: Secondary | ICD-10-CM | POA: Insufficient documentation

## 2013-06-19 DIAGNOSIS — G8929 Other chronic pain: Secondary | ICD-10-CM | POA: Insufficient documentation

## 2013-06-19 DIAGNOSIS — Z872 Personal history of diseases of the skin and subcutaneous tissue: Secondary | ICD-10-CM | POA: Insufficient documentation

## 2013-06-19 LAB — CBC
HCT: 36.9 % (ref 36.0–46.0)
Hemoglobin: 12 g/dL (ref 12.0–15.0)
MCH: 25.2 pg — ABNORMAL LOW (ref 26.0–34.0)
MCHC: 32.5 g/dL (ref 30.0–36.0)
MCV: 77.5 fL — AB (ref 78.0–100.0)
PLATELETS: 394 10*3/uL (ref 150–400)
RBC: 4.76 MIL/uL (ref 3.87–5.11)
RDW: 15.6 % — ABNORMAL HIGH (ref 11.5–15.5)
WBC: 7.8 10*3/uL (ref 4.0–10.5)

## 2013-06-19 LAB — COMPREHENSIVE METABOLIC PANEL
ALBUMIN: 4 g/dL (ref 3.5–5.2)
ALT: 12 U/L (ref 0–35)
AST: 20 U/L (ref 0–37)
Alkaline Phosphatase: 105 U/L (ref 39–117)
BUN: 15 mg/dL (ref 6–23)
CALCIUM: 9.6 mg/dL (ref 8.4–10.5)
CO2: 27 mEq/L (ref 19–32)
Chloride: 96 mEq/L (ref 96–112)
Creatinine, Ser: 0.84 mg/dL (ref 0.50–1.10)
GFR calc Af Amer: >90 mL/min (ref >90)
GFR, EST NON AFRICAN AMERICAN: 83 mL/min — AB (ref >90)
Glucose, Bld: 95 mg/dL (ref 70–99)
Potassium: 4.3 mEq/L (ref 3.7–5.3)
SODIUM: 134 meq/L — AB (ref 137–147)
Total Bilirubin: 0.5 mg/dL (ref 0.3–1.2)
Total Protein: 7.5 g/dL (ref 6.0–8.3)

## 2013-06-19 LAB — LIPASE, BLOOD: Lipase: 36 U/L (ref 11–59)

## 2013-06-19 MED ORDER — KETOROLAC TROMETHAMINE 60 MG/2ML IM SOLN
60.0000 mg | Freq: Once | INTRAMUSCULAR | Status: AC
  Administered 2013-06-19: 60 mg via INTRAMUSCULAR
  Filled 2013-06-19: qty 2

## 2013-06-19 MED ORDER — HYDROMORPHONE HCL PF 2 MG/ML IJ SOLN
2.0000 mg | Freq: Once | INTRAMUSCULAR | Status: AC
  Administered 2013-06-19: 2 mg via INTRAMUSCULAR
  Filled 2013-06-19: qty 1

## 2013-06-19 MED ORDER — OXYCODONE-ACETAMINOPHEN 5-325 MG PO TABS
1.0000 | ORAL_TABLET | Freq: Once | ORAL | Status: AC
  Administered 2013-06-19: 1 via ORAL
  Filled 2013-06-19: qty 1

## 2013-06-19 NOTE — ED Notes (Addendum)
Pt reports migraine unrelieved by Excedrin and Tylenol. Pt also reports chronic abdominal pain related to Wegener. Pt states "it/Wegener's is acting up." Pt denies N/V/D.

## 2013-06-19 NOTE — ED Notes (Addendum)
Pt from home reports migraine and abd pain since last pm. Pt denies N./V/D. Pt also denies dysuria or frequency. Pt took Tylenol and Excedrin for migraine with no relief. Pt is A&O and in NAD

## 2013-06-19 NOTE — ED Notes (Signed)
Unsuccessful lab draw attempt by this RN.

## 2013-06-19 NOTE — ED Notes (Signed)
Main Lab called for lab collection.

## 2013-06-19 NOTE — ED Notes (Signed)
Main lab at bedside at present time.

## 2013-06-19 NOTE — ED Provider Notes (Signed)
CSN: [id number]     Arrival date & time 06/19/13  1045 History   First MD Initiated Contact with Patient 06/19/13 1116     Chief Complaint  Patient presents with  . Migraine  . Abdominal Pain      The history is provided by the patient.   patient reports headache and upper abdominal discomfort since yesterday.  She has a long-standing history of abdominal pain and has a history of Wegener's.  She also reports a history of headaches however his been several years since had headaches like this.  She denies photophobia and phonophobia.  No weakness of her arms or legs.  No recent head injury or trauma.  She denies fevers and chills.  No nausea or vomiting.  No diarrhea.  No hematochezia or melena described.  Abdominal pain is waxing and waning in generalized her upper abdomen.  She has a history of chronic pain in her feet and is on a fentanyl patch.  Past Medical History  Diagnosis Date  . Mononeuritis multiplex   . Unspecified hereditary and idiopathic peripheral neuropathy   . Hearing loss   . Rash   . Joint pain   . Ischemic bowel disease   . Cyst   . Wegener's granulomatosis   . Nasal bleeding   . Hypertension   . Hypothyroidism   . Anxiety     generalized   Past Surgical History  Procedure Laterality Date  . Tubal ligation  1992   Family History  Problem Relation Age of Onset  . Diabetes Mother   . Hypertension Mother   . Breast cancer Mother   . Diabetes Sister   . Hypertension Sister    History  Substance Use Topics  . Smoking status: Former Smoker    Quit date: 05/06/2007  . Smokeless tobacco: Never Used  . Alcohol Use: No   OB History   Grav Para Term Preterm Abortions TAB SAB Ect Mult Living                 Review of Systems  All other systems reviewed and are negative.     Allergies  Review of patient's allergies indicates no known allergies.  Home Medications   Prior to Admission medications   Medication Sig Start Date End Date Taking?  Authorizing Provider  acetaminophen (TYLENOL) 500 MG tablet Take 1,000 mg by mouth every 6 (six) hours as needed for headache.   Yes Historical Provider, MD  aspirin-acetaminophen-caffeine (EXCEDRIN MIGRAINE) 515 195 [id number] MG per tablet Take 1 tablet by mouth every 6 (six) hours as needed for headache.   Yes Historical Provider, MD  azaTHIOprine (IMURAN) 50 MG tablet Take 100 mg by mouth 2 (two) times daily.   Yes Historical Provider, MD  fentaNYL (DURAGESIC - DOSED MCG/HR) 100 MCG/HR Place 100 mcg onto the skin every 3 (three) days.   Yes Historical Provider, MD  lisinopril (PRINIVIL,ZESTRIL) 20 MG tablet Take 20 mg by mouth daily.   Yes Historical Provider, MD  NUCYNTA 50 MG TABS tablet Take 50 mg by mouth every 6 (six) hours as needed for moderate pain.  06/03/13  Yes Historical Provider, MD  ondansetron (ZOFRAN-ODT) 8 MG disintegrating tablet Take 1 tablet (8 mg total) by mouth every 12 (twelve) hours as needed for nausea. 05/03/13  Yes Gavin PoundMichael Y. Ghim, MD  sulfamethoxazole-trimethoprim (BACTRIM DS) 800-160 MG per tablet Take 1 tablet by mouth 2 (two) times daily.  05/24/13  Yes Historical Provider, MD  triamcinolone ointment (KENALOG) 0.5 % Apply  1 application topically 2 (two) times daily. Arm rash 06/14/13  Yes Historical Provider, MD  zolpidem (AMBIEN) 10 MG tablet Take 10 mg by mouth at bedtime as needed. For sleep   Yes Historical Provider, MD   BP 138/82  Pulse 63  Resp 16  SpO2 100% Physical Exam  Nursing note and vitals reviewed. Constitutional: She is oriented to person, place, and time. She appears well-developed and well-nourished. No distress.  HENT:  Head: Normocephalic and atraumatic.  Eyes: EOM are normal.  Neck: Normal range of motion.  Cardiovascular: Normal rate, regular rhythm and normal heart sounds.   Pulmonary/Chest: Effort normal and breath sounds normal.  Abdominal: Soft. She exhibits no distension.  Very mild generalized abdominal tenderness without guarding or rebound   Musculoskeletal: Normal range of motion.  Neurological: She is alert and oriented to person, place, and time.  Skin: Skin is warm and dry.  Psychiatric: She has a normal mood and affect. Judgment normal.    ED Course  Procedures (including critical care time) Labs Review Labs Reviewed  CBC - Abnormal; Notable for the following:    MCV 77.5 (*)    MCH 25.2 (*)    RDW 15.6 (*)    All other components within normal limits  COMPREHENSIVE METABOLIC PANEL - Abnormal; Notable for the following:    Sodium 134 (*)    GFR calc non Af Amer 83 (*)    All other components within normal limits  LIPASE, BLOOD    Imaging Review No results found.   EKG Interpretation None      MDM   Final diagnoses:  Abdominal pain  Headache    2:55 PM Patient feels better at this time.  Discharge home in good condition.  Abdominal exam is benign.  No indication for imaging.  No urinary symptoms    Lyanne CoKevin M Abdirahim Flavell, MD 06/19/13 1455

## 2013-09-25 ENCOUNTER — Emergency Department (HOSPITAL_COMMUNITY)
Admission: EM | Admit: 2013-09-25 | Discharge: 2013-09-25 | Disposition: A | Discharge status: Home / Self Care | Payer: Managed Care, Other (non HMO) | Attending: Emergency Medicine | Admitting: Emergency Medicine

## 2013-09-25 ENCOUNTER — Encounter (HOSPITAL_COMMUNITY): Payer: Self-pay | Admitting: Emergency Medicine

## 2013-09-25 DIAGNOSIS — R519 Headache, unspecified: Secondary | ICD-10-CM

## 2013-09-25 DIAGNOSIS — Z8669 Personal history of other diseases of the nervous system and sense organs: Secondary | ICD-10-CM | POA: Insufficient documentation

## 2013-09-25 DIAGNOSIS — Z8639 Personal history of other endocrine, nutritional and metabolic disease: Secondary | ICD-10-CM | POA: Insufficient documentation

## 2013-09-25 DIAGNOSIS — M3130 Wegener's granulomatosis without renal involvement: Secondary | ICD-10-CM

## 2013-09-25 DIAGNOSIS — Z87891 Personal history of nicotine dependence: Secondary | ICD-10-CM | POA: Insufficient documentation

## 2013-09-25 DIAGNOSIS — I10 Essential (primary) hypertension: Secondary | ICD-10-CM | POA: Insufficient documentation

## 2013-09-25 DIAGNOSIS — R51 Headache: Secondary | ICD-10-CM | POA: Insufficient documentation

## 2013-09-25 DIAGNOSIS — Z862 Personal history of diseases of the blood and blood-forming organs and certain disorders involving the immune mechanism: Secondary | ICD-10-CM | POA: Insufficient documentation

## 2013-09-25 DIAGNOSIS — Z8659 Personal history of other mental and behavioral disorders: Secondary | ICD-10-CM | POA: Insufficient documentation

## 2013-09-25 DIAGNOSIS — Z79899 Other long term (current) drug therapy: Secondary | ICD-10-CM | POA: Insufficient documentation

## 2013-09-25 DIAGNOSIS — Z8719 Personal history of other diseases of the digestive system: Secondary | ICD-10-CM | POA: Insufficient documentation

## 2013-09-25 DIAGNOSIS — R1013 Epigastric pain: Secondary | ICD-10-CM

## 2013-09-25 LAB — LIPASE, BLOOD: Lipase: 44 U/L (ref 11–59)

## 2013-09-25 LAB — CBC WITH DIFFERENTIAL/PLATELET
Basophils Absolute: 0 10*3/uL (ref 0.0–0.1)
Basophils Relative: 0 % (ref 0–1)
EOS PCT: 3 % (ref 0–5)
Eosinophils Absolute: 0.2 10*3/uL (ref 0.0–0.7)
HEMATOCRIT: 37.5 % (ref 36.0–46.0)
HEMOGLOBIN: 12.2 g/dL (ref 12.0–15.0)
LYMPHS ABS: 1.7 10*3/uL (ref 0.7–4.0)
LYMPHS PCT: 28 % (ref 12–46)
MCH: 25.5 pg — ABNORMAL LOW (ref 26.0–34.0)
MCHC: 32.5 g/dL (ref 30.0–36.0)
MCV: 78.3 fL (ref 78.0–100.0)
MONOS PCT: 9 % (ref 3–12)
Monocytes Absolute: 0.6 10*3/uL (ref 0.1–1.0)
Neutro Abs: 3.8 10*3/uL (ref 1.7–7.7)
Neutrophils Relative %: 60 % (ref 43–77)
Platelets: 397 10*3/uL (ref 150–400)
RBC: 4.79 MIL/uL (ref 3.87–5.11)
RDW: 15.3 % (ref 11.5–15.5)
WBC: 6.3 10*3/uL (ref 4.0–10.5)

## 2013-09-25 LAB — I-STAT CHEM 8, ED
BUN: 11 mg/dL (ref 6–23)
CHLORIDE: 106 meq/L (ref 96–112)
Calcium, Ion: 1.15 mmol/L (ref 1.12–1.23)
Creatinine, Ser: 0.7 mg/dL (ref 0.50–1.10)
Glucose, Bld: 81 mg/dL (ref 70–99)
HCT: 44 % (ref 36.0–46.0)
Hemoglobin: 15 g/dL (ref 12.0–15.0)
Potassium: 4.6 mEq/L (ref 3.7–5.3)
Sodium: 139 mEq/L (ref 137–147)
TCO2: 25 mmol/L (ref 0–100)

## 2013-09-25 LAB — COMPREHENSIVE METABOLIC PANEL
ALT: 9 U/L (ref 0–35)
AST: 20 U/L (ref 0–37)
Albumin: 4 g/dL (ref 3.5–5.2)
Alkaline Phosphatase: 103 U/L (ref 39–117)
Anion gap: 13 (ref 5–15)
BUN: 11 mg/dL (ref 6–23)
CALCIUM: 10.1 mg/dL (ref 8.4–10.5)
CO2: 26 meq/L (ref 19–32)
Chloride: 102 mEq/L (ref 96–112)
Creatinine, Ser: 0.71 mg/dL (ref 0.50–1.10)
GFR calc Af Amer: >90 mL/min (ref >90)
GLUCOSE: 98 mg/dL (ref 70–99)
Potassium: 4.2 mEq/L (ref 3.7–5.3)
SODIUM: 141 meq/L (ref 137–147)
Total Bilirubin: 0.4 mg/dL (ref 0.3–1.2)
Total Protein: 8 g/dL (ref 6.0–8.3)

## 2013-09-25 LAB — I-STAT TROPONIN, ED: Troponin i, poc: 0 ng/mL (ref 0.00–0.08)

## 2013-09-25 MED ORDER — HYDROCODONE-ACETAMINOPHEN 5-325 MG PO TABS
2.0000 | ORAL_TABLET | Freq: Once | ORAL | Status: AC
  Administered 2013-09-25: 2 via ORAL
  Filled 2013-09-25: qty 2

## 2013-09-25 MED ORDER — GI COCKTAIL ~~LOC~~
30.0000 mL | Freq: Once | ORAL | Status: AC
  Administered 2013-09-25: 30 mL via ORAL
  Filled 2013-09-25: qty 30

## 2013-09-25 MED ORDER — FAMOTIDINE 20 MG PO TABS
20.0000 mg | ORAL_TABLET | Freq: Once | ORAL | Status: AC
  Administered 2013-09-25: 20 mg via ORAL
  Filled 2013-09-25: qty 1

## 2013-09-25 MED ORDER — TRAMADOL HCL 50 MG PO TABS: 50.0000 mg | ORAL_TABLET | Freq: Four times a day (QID) | ORAL | Status: DC | PRN

## 2013-09-25 MED ORDER — PANTOPRAZOLE SODIUM 40 MG PO TBEC: 40.0000 mg | DELAYED_RELEASE_TABLET | Freq: Every day | ORAL | Status: DC

## 2013-09-25 MED ORDER — HYDROMORPHONE HCL PF 1 MG/ML IJ SOLN
1.0000 mg | Freq: Once | INTRAMUSCULAR | Status: AC
  Administered 2013-09-25: 1 mg via INTRAMUSCULAR
  Filled 2013-09-25: qty 1

## 2013-09-25 NOTE — ED Notes (Signed)
Pt c/o headache and abdominal pain since last night, denies n/v/d

## 2013-09-25 NOTE — ED Provider Notes (Signed)
CSN: [id number]     Arrival date & time 09/25/13  1047 History   First MD Initiated Contact with Patient 09/25/13 1102     Chief Complaint  Patient presents with  . Headache  . Abdominal Pain     (Consider location/radiation/quality/duration/timing/severity/associated sxs/prior Treatment) Patient is a 48 y.o. female presenting with headaches and abdominal pain. The history is provided by the patient.  Headache Associated symptoms: abdominal pain   Associated symptoms: no back pain, no cough, no diarrhea, no fever, no neck pain, no numbness, no sore throat and no vomiting   Abdominal Pain Associated symptoms: no chest pain, no chills, no constipation, no cough, no diarrhea, no fever, no shortness of breath, no sore throat and no vomiting   pt with hx Wegeners granulomatosis, c/o epigastric pain in past day. Constant. Dull. Non radiating. No specific exacerbating or alleviating factors. No relation to eating. Normal appetite. No nvd. No constipation. No abd distension. States only prior abd surgery is remote hx tubal ligation. No back or flank pain. No hx pud, pancreatitis, or gallstones. States hx abd pain due to Wegeners, this is possibly consistent w that. Denies cough, chest pain or sob, no resp symptoms.  Pt also notes gradual onset dull frontal headache in past day, moderate. C/w prior headaches. Was mild at onset and gradual in onset. No acute or abrupt change since onset.  No neck pain or stiffness. No nv. No eye pain or change in vision. No change in speech. No change in gait, balance or functional ability. No sinus pain or drainage. No recent head injury or trauma. No fever or chills.      Past Medical History  Diagnosis Date  . Mononeuritis multiplex   . Unspecified hereditary and idiopathic peripheral neuropathy   . Hearing loss   . Rash   . Joint pain   . Ischemic bowel disease   . Cyst   . Wegener's granulomatosis   . Nasal bleeding   . Hypertension   . Hypothyroidism    . Anxiety     generalized   Past Surgical History  Procedure Laterality Date  . Tubal ligation  1992   Family History  Problem Relation Age of Onset  . Diabetes Mother   . Hypertension Mother   . Breast cancer Mother   . Diabetes Sister   . Hypertension Sister    History  Substance Use Topics  . Smoking status: Former Smoker    Quit date: 05/06/2007  . Smokeless tobacco: Never Used  . Alcohol Use: No   OB History   Grav Para Term Preterm Abortions TAB SAB Ect Mult Living                 Review of Systems  Constitutional: Negative for fever and chills.  HENT: Negative for sore throat.   Eyes: Negative for redness.  Respiratory: Negative for cough and shortness of breath.   Cardiovascular: Negative for chest pain and leg swelling.  Gastrointestinal: Positive for abdominal pain. Negative for vomiting, diarrhea and constipation.  Genitourinary: Negative for flank pain.  Musculoskeletal: Negative for back pain and neck pain.  Skin: Negative for rash.  Neurological: Positive for headaches. Negative for syncope, weakness and numbness.  Hematological: Does not bruise/bleed easily.  Psychiatric/Behavioral: Negative for confusion.      Allergies  Review of patient's allergies indicates no known allergies.  Home Medications   Prior to Admission medications   Medication Sig Start Date End Date Taking? Authorizing Provider  acetaminophen (  TYLENOL) 500 MG tablet Take 1,000 mg by mouth every 6 (six) hours as needed for headache.   Yes Historical Provider, MD  azaTHIOprine (IMURAN) 50 MG tablet Take 100 mg by mouth 2 (two) times daily.   Yes Historical Provider, MD  fentaNYL (DURAGESIC - DOSED MCG/HR) 100 MCG/HR Place 100 mcg onto the skin every 3 (three) days.   Yes Historical Provider, MD  lisinopril (PRINIVIL,ZESTRIL) 20 MG tablet Take 20 mg by mouth daily.   Yes Historical Provider, MD  NUCYNTA 50 MG TABS tablet Take 50 mg by mouth every 6 (six) hours as needed for  moderate pain.  06/03/13  Yes Historical Provider, MD  sulfamethoxazole-trimethoprim (BACTRIM DS) 800-160 MG per tablet Take 1 tablet by mouth daily.  05/24/13  Yes Historical Provider, MD  zolpidem (AMBIEN) 10 MG tablet Take 10 mg by mouth at bedtime as needed. For sleep   Yes Historical Provider, MD   BP 137/88  Pulse 81  Temp(Src) 98 F (36.7 C) (Oral)  Resp 18  SpO2 99% Physical Exam  Nursing note and vitals reviewed. Constitutional: She is oriented to person, place, and time. She appears well-developed and well-nourished. No distress.  HENT:  Head: Atraumatic.  Nose: Nose normal.  Mouth/Throat: Oropharynx is clear and moist.  No sinus or temporal tenderness.  Eyes: Conjunctivae and EOM are normal. Pupils are equal, round, and reactive to light. No scleral icterus.  Neck: Neck supple. No tracheal deviation present. No thyromegaly present.  No stiffness or rigidity.   Cardiovascular: Normal rate, regular rhythm, normal heart sounds and intact distal pulses.  Exam reveals no gallop and no friction rub.   No murmur heard. Pulmonary/Chest: Effort normal and breath sounds normal. No respiratory distress.  Abdominal: Soft. Normal appearance and bowel sounds are normal. She exhibits no distension and no mass. There is tenderness. There is no rebound and no guarding.  Mild epigastric tenderness, no rebound or guarding.   Genitourinary:  No cva tenderness.  Musculoskeletal: Normal range of motion. She exhibits no edema and no tenderness.  Neurological: She is alert and oriented to person, place, and time. No cranial nerve deficit.  Motor intact bilaterally. Steady gait.   Skin: Skin is warm and dry. No rash noted. She is not diaphoretic.  Psychiatric: She has a normal mood and affect.    ED Course  Procedures (including critical care time) Labs Review   Results for orders placed during the hospital encounter of 09/25/13  CBC WITH DIFFERENTIAL      Result Value Ref Range   WBC 6.3   4.0 - 10.5 K/uL   RBC 4.79  3.87 - 5.11 MIL/uL   Hemoglobin 12.2  12.0 - 15.0 g/dL   HCT 16.1  09.6 - 04.5 %   MCV 78.3  78.0 - 100.0 fL   MCH 25.5 (*) 26.0 - 34.0 pg   MCHC 32.5  30.0 - 36.0 g/dL   RDW 40.9  81.1 - 91.4 %   Platelets 397  150 - 400 K/uL   Neutrophils Relative % 60  43 - 77 %   Neutro Abs 3.8  1.7 - 7.7 K/uL   Lymphocytes Relative 28  12 - 46 %   Lymphs Abs 1.7  0.7 - 4.0 K/uL   Monocytes Relative 9  3 - 12 %   Monocytes Absolute 0.6  0.1 - 1.0 K/uL   Eosinophils Relative 3  0 - 5 %   Eosinophils Absolute 0.2  0.0 - 0.7 K/uL  Basophils Relative 0  0 - 1 %   Basophils Absolute 0.0  0.0 - 0.1 K/uL  COMPREHENSIVE METABOLIC PANEL      Result Value Ref Range   Sodium 141  137 - 147 mEq/L   Potassium 4.2  3.7 - 5.3 mEq/L   Chloride 102  96 - 112 mEq/L   CO2 26  19 - 32 mEq/L   Glucose, Bld 98  70 - 99 mg/dL   BUN 11  6 - 23 mg/dL   Creatinine, Ser 1.610.71  0.50 - 1.10 mg/dL   Calcium 09.610.1  8.4 - 04.510.5 mg/dL   Total Protein 8.0  6.0 - 8.3 g/dL   Albumin 4.0  3.5 - 5.2 g/dL   AST 20  0 - 37 U/L   ALT 9  0 - 35 U/L   Alkaline Phosphatase 103  39 - 117 U/L   Total Bilirubin 0.4  0.3 - 1.2 mg/dL   GFR calc non Af Amer >90  >90 mL/min   GFR calc Af Amer >90  >90 mL/min   Anion gap 13  5 - 15  LIPASE, BLOOD      Result Value Ref Range   Lipase 44  11 - 59 U/L  I-STAT TROPOININ, ED      Result Value Ref Range   Troponin i, poc 0.00  0.00 - 0.08 ng/mL   Comment 3           I-STAT CHEM 8, ED      Result Value Ref Range   Sodium 139  137 - 147 mEq/L   Potassium 4.6  3.7 - 5.3 mEq/L   Chloride 106  96 - 112 mEq/L   BUN 11  6 - 23 mg/dL   Creatinine, Ser 4.090.70  0.50 - 1.10 mg/dL   Glucose, Bld 81  70 - 99 mg/dL   Calcium, Ion 8.111.15  9.141.12 - 1.23 mmol/L   TCO2 25  0 - 100 mmol/L   Hemoglobin 15.0  12.0 - 15.0 g/dL   HCT 78.244.0  95.636.0 - 21.346.0 %      EKG Interpretation   Date/Time:  Saturday September 25 2013 11:04:25 EDT Ventricular Rate:  82 PR Interval:  145 QRS  Duration: 85 QT Interval:  361 QTC Calculation: 422 R Axis:   -22 Text Interpretation:  Sinus rhythm Left axis deviation No significant  change since last tracing Confirmed by Denton LankSTEINL  MD, Caryn BeeKEVIN ([id number]) on  09/25/2013 11:08:50 AM      MDM  Reviewed nursing notes and prior charts for additional history.   Labs.  Pt states has ride, does not have to drive.  vicodin po. pepcid and gi cocktail po.  Recheck pt comfortable.  abd soft nt.  Labs unremarkable. Afeb.  Pt appears stable for d/c.      Suzi RootsKevin E Janessa Mickle, MD 09/25/13 1320

## 2013-09-25 NOTE — Discharge Instructions (Signed)
Take protonix (acid blocker medication).  You may also try pepcid and maalox as need for symptom relief. You may take ultram as need for pain - no driving when taking. Follow up with primary care doctor Monday for recheck if symptoms fail to improve/resolve. Return to ER if worse, new symptoms, fevers, persistent vomiting, severe pain, other concern.  You were given pain medication in the ER - no driving for the next 4 hours.     Abdominal Pain Many things can cause abdominal pain. Usually, abdominal pain is not caused by a disease and will improve without treatment. It can often be observed and treated at home. Your health care provider will do a physical exam and possibly order blood tests and X-rays to help determine the seriousness of your pain. However, in many cases, more time must pass before a clear cause of the pain can be found. Before that point, your health care provider may not know if you need more testing or further treatment. HOME CARE INSTRUCTIONS  Monitor your abdominal pain for any changes. The following actions may help to alleviate any discomfort you are experiencing:  Only take over-the-counter or prescription medicines as directed by your health care provider.  Do not take laxatives unless directed to do so by your health care provider.  Try a clear liquid diet (broth, tea, or water) as directed by your health care provider. Slowly move to a bland diet as tolerated. SEEK MEDICAL CARE IF:  You have unexplained abdominal pain.  You have abdominal pain associated with nausea or diarrhea.  You have pain when you urinate or have a bowel movement.  You experience abdominal pain that wakes you in the night.  You have abdominal pain that is worsened or improved by eating food.  You have abdominal pain that is worsened with eating fatty foods.  You have a fever. SEEK IMMEDIATE MEDICAL CARE IF:   Your pain does not go away within 2 hours.  You keep throwing up  (vomiting).  Your pain is felt only in portions of the abdomen, such as the right side or the left lower portion of the abdomen.  You pass bloody or black tarry stools. MAKE SURE YOU:  Understand these instructions.   Will watch your condition.   Will get help right away if you are not doing well or get worse.  Document Released: 11/21/2004 Document Revised: 02/16/2013 Document Reviewed: 10/21/2012 [facility] Patient Information 2015 [place], [place]. This information is not intended to replace advice given to you by your health care provider. Make sure you discuss any questions you have with your health care provider.  Abdominal Pain Many things can cause abdominal pain. Usually, abdominal pain is not caused by a disease and will improve without treatment. It can often be observed and treated at home. Your health care provider will do a physical exam and possibly order blood tests and X-rays to help determine the seriousness of your pain. However, in many cases, more time must pass before a clear cause of the pain can be found. Before that point, your health care provider may not know if you need more testing or further treatment. HOME CARE INSTRUCTIONS  Monitor your abdominal pain for any changes. The following actions may help to alleviate any discomfort you are experiencing:  Only take over-the-counter or prescription medicines as directed by your health care provider.  Do not take laxatives unless directed to do so by your health care provider.  Try a clear liquid diet (  broth, tea, or water) as directed by your health care provider. Slowly move to a bland diet as tolerated. SEEK MEDICAL CARE IF:  You have unexplained abdominal pain.  You have abdominal pain associated with nausea or diarrhea.  You have pain when you urinate or have a bowel movement.  You experience abdominal pain that wakes you in the night.  You have abdominal pain that is worsened or improved by eating  food.  You have abdominal pain that is worsened with eating fatty foods.  You have a fever. SEEK IMMEDIATE MEDICAL CARE IF:   Your pain does not go away within 2 hours.  You keep throwing up (vomiting).  Your pain is felt only in portions of the abdomen, such as the right side or the left lower portion of the abdomen.  You pass bloody or black tarry stools. MAKE SURE YOU:  Understand these instructions.   Will watch your condition.   Will get help right away if you are not doing well or get worse.  Document Released: 11/21/2004 Document Revised: 02/16/2013 Document Reviewed: 10/21/2012 [facility] Patient Information 2015 [place]. This information is not intended to replace advice given to you by your health care provider. Make sure you discuss any questions you have with your health care provider.     General Headache Without Cause A headache is pain or discomfort felt around the head or neck area. The specific cause of a headache may not be found. There are many causes and types of headaches. A few common ones are:  Tension headaches.  Migraine headaches.  Cluster headaches.  Chronic daily headaches. HOME CARE INSTRUCTIONS   Keep all follow-up appointments with your caregiver or any specialist referral.  Only take over-the-counter or prescription medicines for pain or discomfort as directed by your caregiver.  Lie down in a dark, quiet room when you have a headache.  Keep a headache journal to find out what may trigger your migraine headaches. For example, write down:  What you eat and drink.  How much sleep you get.  Any change to your diet or medicines.  Try massage or other relaxation techniques.  Put ice packs or heat on the head and neck. Use these 3 to 4 times per day for 15 to 20 minutes each time, or as needed.  Limit stress.  Sit up straight, and do not tense your muscles.  Quit smoking if you smoke.  Limit alcohol use.  Decrease the  amount of caffeine you drink, or stop drinking caffeine.  Eat and sleep on a regular schedule.  Get 7 to 9 hours of sleep, or as recommended by your caregiver.  Keep lights dim if bright lights bother you and make your headaches worse. SEEK MEDICAL CARE IF:   You have problems with the medicines you were prescribed.  Your medicines are not working.  You have a change from the usual headache.  You have nausea or vomiting. SEEK IMMEDIATE MEDICAL CARE IF:   Your headache becomes severe.  You have a fever.  You have a stiff neck.  You have loss of vision.  You have muscular weakness or loss of muscle control.  You start losing your balance or have trouble walking.  You feel faint or pass out.  You have severe symptoms that are different from your first symptoms. MAKE SURE YOU:   Understand these instructions.  Will watch your condition.  Will get help right away if you are not doing well or get worse.  Document Released: 02/11/2005 Document Revised: 05/06/2011 Document Reviewed: 02/27/2011 [facility] Patient Information 2015 [place], [place]. This information is not intended to replace advice given to you by your health care provider. Make sure you discuss any questions you have with your health care provider.

## 2013-09-25 NOTE — ED Notes (Signed)
Lab called for blood draw after RN tried x2 and ED phlebotomist tried x1.

## 2013-09-25 NOTE — ED Notes (Signed)
Pt c/o epigastric pain since this morning along with moderate head pain.  Pt denies dizziness, fever, N/V and SOB.

## 2013-10-09 ENCOUNTER — Encounter (HOSPITAL_COMMUNITY): Payer: Self-pay | Admitting: Emergency Medicine

## 2013-10-09 ENCOUNTER — Emergency Department (HOSPITAL_COMMUNITY)
Admission: EM | Admit: 2013-10-09 | Discharge: 2013-10-09 | Disposition: A | Discharge status: Home / Self Care | Payer: Managed Care, Other (non HMO) | Attending: Emergency Medicine | Admitting: Emergency Medicine

## 2013-10-09 DIAGNOSIS — Z862 Personal history of diseases of the blood and blood-forming organs and certain disorders involving the immune mechanism: Secondary | ICD-10-CM | POA: Diagnosis not present

## 2013-10-09 DIAGNOSIS — R51 Headache: Secondary | ICD-10-CM | POA: Diagnosis present

## 2013-10-09 DIAGNOSIS — Z8639 Personal history of other endocrine, nutritional and metabolic disease: Secondary | ICD-10-CM | POA: Diagnosis not present

## 2013-10-09 DIAGNOSIS — H9190 Unspecified hearing loss, unspecified ear: Secondary | ICD-10-CM | POA: Diagnosis not present

## 2013-10-09 DIAGNOSIS — Z8659 Personal history of other mental and behavioral disorders: Secondary | ICD-10-CM | POA: Insufficient documentation

## 2013-10-09 DIAGNOSIS — I10 Essential (primary) hypertension: Secondary | ICD-10-CM | POA: Diagnosis not present

## 2013-10-09 DIAGNOSIS — Z792 Long term (current) use of antibiotics: Secondary | ICD-10-CM | POA: Insufficient documentation

## 2013-10-09 DIAGNOSIS — Z87891 Personal history of nicotine dependence: Secondary | ICD-10-CM | POA: Insufficient documentation

## 2013-10-09 DIAGNOSIS — Z8719 Personal history of other diseases of the digestive system: Secondary | ICD-10-CM | POA: Diagnosis not present

## 2013-10-09 DIAGNOSIS — G8929 Other chronic pain: Secondary | ICD-10-CM | POA: Insufficient documentation

## 2013-10-09 DIAGNOSIS — Z79899 Other long term (current) drug therapy: Secondary | ICD-10-CM | POA: Insufficient documentation

## 2013-10-09 HISTORY — DX: Other chronic pain: G89.29

## 2013-10-09 HISTORY — DX: Headache, unspecified: R51.9

## 2013-10-09 HISTORY — DX: Headache: R51

## 2013-10-09 HISTORY — DX: Pain in unspecified foot: M79.673

## 2013-10-09 HISTORY — DX: Unspecified abdominal pain: R10.9

## 2013-10-09 MED ORDER — HYDROMORPHONE HCL PF 1 MG/ML IJ SOLN
2.0000 mg | Freq: Once | INTRAMUSCULAR | Status: AC
  Administered 2013-10-09: 2 mg via INTRAMUSCULAR
  Filled 2013-10-09: qty 2

## 2013-10-09 MED ORDER — PROMETHAZINE HCL 25 MG PO TABS: 25.0000 mg | ORAL_TABLET | Freq: Four times a day (QID) | ORAL | Status: DC | PRN

## 2013-10-09 NOTE — ED Notes (Signed)
MD at bedside. 

## 2013-10-09 NOTE — ED Notes (Signed)
NAD at this time.  

## 2013-10-09 NOTE — Discharge Instructions (Signed)
°Emergency Department Resource Guide °1) Find a Doctor and Pay Out of Pocket °Although you won't have to find out who is covered by your insurance plan, it is a good idea to ask around and get recommendations. You will then need to call the office and see if the doctor you have chosen will accept you as a new patient and what types of options they offer for patients who are self-pay. Some doctors offer discounts or will set up payment plans for their patients who do not have insurance, but you will need to ask so you aren't surprised when you get to your appointment. ° °2) Contact Your Local Health Department °Not all health departments have doctors that can see patients for sick visits, but many do, so it is worth a call to see if yours does. If you don't know where your local health department is, you can check in your phone book. The CDC also has a tool to help you locate your state's health department, and many state websites also have listings of all of their local health departments. ° °3) Find a Walk-in Clinic °If your illness is not likely to be very severe or complicated, you may want to try a walk in clinic. These are popping up all over the country in pharmacies, drugstores, and shopping centers. They're usually staffed by nurse practitioners or physician assistants that have been trained to treat common illnesses and complaints. They're usually fairly quick and inexpensive. However, if you have serious medical issues or chronic medical problems, these are probably not your best option. ° °No Primary Care Doctor: °- Call [facility] Connect at  832-8000 - they can help you locate a primary care doctor that  accepts your insurance, provides certain services, etc. °- Physician Referral Service- 1-800-533-3463 ° °Chronic Pain Problems: °Organization         Address  Phone   Notes  °[facility]  (336) 297-2271 Patients need to be referred by their primary care doctor.  ° °Medication  Assistance: °Organization         Address  Phone   Notes  °[place] [facility] [address] °[place], [place] [address] (336) 641-8030 --Must be a resident of [place] °-- Must have NO insurance coverage whatsoever (no Medicaid/ Medicare, etc.) °-- The pt. MUST have a primary care doctor that directs their care regularly and follows them in the community °  °[facility]  (866) 331-1348   °[employer]  (888) 892-1162   ° °Agencies that provide inexpensive medical care: °Organization         Address  Phone   Notes  °[facility]  (336) 832-8035   °[facility]    (336) 832-7272   °[facility] [address] °[place], [place] [address] (336) 832-4777   °[facility] [address], °[place] (336) 271-4999   °[facility]    (336) 373-0678   °[facility]    (336) 272-1050   °[facility] ° [address], [place] Phone:  (336) 832-4444, Fax:  (336) 832-4440 Hours of Operation:  9 am - 6 pm, M-F.  Also accepts Medicaid/Medicare and self-pay.  °[facility] ° [address], [place] Phone: (336) [phone], Fax: (336) [phone]. Hours of Operation:  8:30 am - 5:30 pm, M-F.  Also accepts Medicaid and self-pay.  °[facility] [place] [address], [place] Phone: (336) 878-6027   °[facility] [address], [place], [place] (336)723-1848, Ext. 123 Mondays & Thursdays: 7-9 AM.  First 15 patients are seen on a first come, first serve basis. °  ° °Medicaid-accepting [place] Providers: ° °Organization         Address  Phone   Notes  °[facility] [address], [place] (336) 641-2100 Also accepts self-pay patients.  °[facility] [address], [place] ° (336) 856-9996   °[facility] [address], [place]  (336) 288-8857   °[facility] [address] [place] [address], [place] (336) 299-7000   °Veita Bland [address], [place]  ° (336) 373-1557 Only accepts [place] Access Medicaid patients after they have their name applied to their card.  ° °Self-Pay (no insurance) in [place]: ° °Organization         Address  Phone   Notes  °Sickle Cell Patients, [facility] [address], [place] (336) 832-1970   °[facility] [address], [place] (336) 832-4400   °[facility] ° [address], [place] (336) 992-4800   °[facility]/Dr. Osei-Bonsu ° [address] [place] [address], [place] or [address], [place] (336) 841-8500 Phone number for both [place] and [place] locations is the same.  °[facility] [address], [place] (336) 299-0000   °[facility] [address] [place] [address], [place] or [address] (336) 852-7530 °(336) 878-2260   °[facility] [address], [place] (336) 350-1642, phone; (336) 294-5005, fax Sees patients 1st and 3rd Saturday of every month.  Must not qualify for public or private insurance (i.e. Medicaid, Medicare, [place] Health Choice, Veterans' Benefits) • Household income should be no more than 200% of the poverty level •The clinic cannot treat you if you are pregnant or think you are pregnant • Sexually transmitted diseases are not treated at the clinic.  ° ° °Dental Care: °Organization         Address  Phone  Notes  °[place] [facility] [address], [place] (336) 641-6152 Accepts children up to age 21 who are enrolled in Medicaid or [place] Health Choice; pregnant women with a Medicaid card; and children who have applied for Medicaid or [place] Health Choice, but were declined, whose parents can pay a reduced fee at time of service.  °[place]  [facility] [place]  [address], [place] (336) 641-7733 Accepts children up to age 21 who are enrolled in Medicaid or [place] Health Choice; pregnant women with a Medicaid card; and children who have applied for Medicaid or [place] Health Choice, but were declined, whose parents can pay a reduced fee at time of service.  °[facility] ° [address], [place] (336) 641-4533 Patients are seen by appointment only. Walk-ins are not accepted. [facility] will see patients 18 years of age and older. °Monday - Tuesday (8am-5pm) °Most Wednesdays (8:30-5pm) °$30 per visit, cash only  °[facility] ° [address], [place] (336) 641-4533 Patients are seen by appointment only. Walk-ins are not accepted. [facility] will see patients 18 years of age and older. °One   Wednesday Evening (Monthly: Volunteer Based).  $30 per visit, cash only  °[facility]  (919) 537-3737 for adults; Children under age 4, call Graduate Pediatric Dentistry at ([phone]) [phone]. Children aged 4-14, please call ([phone]) [phone] to request a pediatric application. ° Dental services are provided in all areas of dental care including fillings, crowns and bridges, complete and partial dentures, implants, gum treatment, root canals, and extractions. Preventive care is also provided. Treatment is provided to both adults and children. °Patients are selected via a lottery and there is often a waiting list. °  °[facility] [address], °[place] ° (336) 763-8833 [email or web address] °  °[facility] [address], [place], [place] (336)723-1848, Ext. 123 Second and Fourth Thursday of each month, opens at 6:30 AM; Clinic ends at 9 AM.  Patients are seen on a first-come first-served basis, and a limited number are seen during each clinic.  ° °[facility] ° [address], [place], [place] (336) 723-7904    Eligibility Requirements °You must have lived in [place], [place], or [place] counties for at least the last three months. °  You cannot be eligible for state or federal sponsored healthcare insurance, including Veterans Administration, Medicaid, or Medicare. °  You generally cannot be eligible for healthcare insurance through your employer.  °  How to apply: °Eligibility screenings are held every Tuesday and Wednesday afternoon from 1:00 pm until 4:00 pm. You do not need an appointment for the interview!  °[facility] [address], [place], [place] 336-631-2330   °[facility]  336-342-8273   °[facility]  336-703-3100   °[facility]  336-570-6415   ° °Behavioral Health Resources in the Community: °Intensive Outpatient Programs °Organization         Address  Phone  Notes  °[facility] [address], [place], [place] 336-878-6098   °[facility] [address], [place], [place] 336-832-9800   °[facility] [address], [place], [place] ° 336-882-2125   °[facility] [address],  °[place], [place] 1-800-853-5163 or 336-641-4981   °Substance Abuse Resources °Organization         Address  Phone  Notes  °[facility]  336-882-2125   °[facility]  336-784-9470   °[facility]  336-285-9073   °[facility]  336-845-3988   °Residential & Outpatient Substance Abuse Program  1-800-659-3381   °Psychological Services °Organization         Address  Phone  Notes  °[facility]  336- 832-9600   °[facility]  336- 378-7881   °[facility] [address], [place] 1-800-853-5163 or 336-641-4981   ° °Mobile Crisis Teams °Organization         Address  Phone  Notes  °[facility], Mobile Crisis Care Unit  1-877-626-1772   °Assertive °[facility] ° [address]  [place], [place] 336-834-9664   °Sharon DeEsch [address] °[place] 336-554-5454   ° °Self-Help/Support Groups °Organization         Address  Phone             Notes  °[facility]  - variety of support groups  336- 373-1402 Call for more information  °Narcotics Anonymous (NA), [facility] [address], °[place]  2 meetings at this location  ° °  Residential Treatment Programs Organization         Address  Phone  Notes  [facility] [address],    [place]  [phone]   [facility]  [address], [place] [phone], [place], [place] [phone]   [facility] [address], [place] [phone] Admissions: 8am-3pm M-F  [facility] [address]    [place], [place] [phone]   [facility] [address], [place], [place] [phone](781) [phone]   [facility] [address]  [place], [place] [phone]   [facility] Intensive Outpatient [address], [place], [place] [phone]) [phone]   [facility] ([facility] [address]  [place], [place] [phone] or ([phone]   [facility]) [address] [place], [place] [phone] Accepts Medicaid  [facility] [address]  [place] [phone] Substance Abuse/Addiction Treatment   [facility]al [facility] Resources Organization         Address  Phone  Notes  [facility]  916-545-6982(888) (574) [phone]   Angie FavaJulie Brannon, PhD [address], [address] [place], [place]   [phone](336) [phone] or [phone](336) [phone]   [facility]   [address] [place], [place] [phone](336) [phone]   [facility] [address], [place], [place] [phone](336) [phone] Insurance/Medicaid/sponsorship through [facility]  [facility] [address]                                    [place], [place] [phone](336) [phone] Therapy/tele-psych/case    [facility] [address]   [place], [place] [phone](336) [phone]    Dr. Lolly MustacheArfeen  [phone](336) [phone]   [facility] [facility]  [employer] [facility] 1) [address], [place] 2) [address], [place] 3)  [address], [place] [phone](336) [phone] [phone](336) [phone]  [phone](336) [phone]   [facility] Child Abuse Hotline [phone](336) 260-278-1706 or [phone](336) (317)[phone] (After Hours)      Take over the counter tylenol and benadryl, as directed on packaging, with the prescription given to you today, as needed for headache.  Keep a headache diary, as discussed.  Call your regular medical doctor on Monday to schedule a follow up appointment within the next 3 days.  Return to the Emergency Department immediately sooner if worsening.

## 2013-10-09 NOTE — ED Provider Notes (Signed)
CSN: [id number]     Arrival date & time 10/09/13  [id number] History   First MD Initiated Contact with Patient 10/09/13 2033     Chief Complaint  Patient presents with  . Headache     HPI Pt was seen at 2045. Per pt, c/o gradual onset and persistence of constant acute flair of her chronic headache that began this afternoon.  Describes the headache as "throbbing," located in her forehead," and per her usual chronic headache pain pattern years.  Denies headache was sudden or maximal in onset or at any time.  Denies visual changes, no focal motor weakness, no tingling/numbness in extremities, no fevers, no neck pain, no rash.  The symptoms have been associated with no other complaints. The patient has a significant history of similar symptoms previously, recently being evaluated for this complaint and multiple prior evals for same.      Past Medical History  Diagnosis Date  . Mononeuritis multiplex   . Unspecified hereditary and idiopathic peripheral neuropathy   . Hearing loss   . Rash   . Joint pain   . Ischemic bowel disease   . Cyst   . Wegener's granulomatosis   . Nasal bleeding   . Hypertension   . Hypothyroidism   . Anxiety     generalized  . Chronic headache   . Chronic abdominal pain   . Chronic foot pain    Past Surgical History  Procedure Laterality Date  . Tubal ligation  1992   Family History  Problem Relation Age of Onset  . Diabetes Mother   . Hypertension Mother   . Breast cancer Mother   . Diabetes Sister   . Hypertension Sister    History  Substance Use Topics  . Smoking status: Former Smoker    Quit date: 05/06/2007  . Smokeless tobacco: Never Used  . Alcohol Use: No    Review of Systems ROS: Statement: All systems negative except as marked or noted in the HPI; Constitutional: Negative for fever and chills. ; ; Eyes: Negative for eye pain, redness and discharge. ; ; ENMT: Negative for ear pain, hoarseness, nasal congestion, sinus pressure and sore  throat. ; ; Cardiovascular: Negative for chest pain, palpitations, diaphoresis, dyspnea and peripheral edema. ; ; Respiratory: Negative for cough, wheezing and stridor. ; ; Gastrointestinal: Negative for nausea, vomiting, diarrhea, abdominal pain, blood in stool, hematemesis, jaundice and rectal bleeding. . ; ; Genitourinary: Negative for dysuria, flank pain and hematuria. ; ; Musculoskeletal: Negative for back pain and neck pain. Negative for swelling and trauma.; ; Skin: Negative for pruritus, rash, abrasions, blisters, bruising and skin lesion.; ; Neuro: +frontal headache. Negative for lightheadedness and neck stiffness. Negative for weakness, altered level of consciousness , altered mental status, extremity weakness, paresthesias, involuntary movement, seizure and syncope.        Allergies  Review of patient's allergies indicates no known allergies.  Home Medications   Prior to Admission medications   Medication Sig Start Date End Date Taking? Authorizing Provider  azaTHIOprine (IMURAN) 50 MG tablet Take 100 mg by mouth 2 (two) times daily.   Yes Historical Provider, MD  fentaNYL (DURAGESIC - DOSED MCG/HR) 100 MCG/HR Place 100 mcg onto the skin every 3 (three) days.   Yes Historical Provider, MD  lisinopril (PRINIVIL,ZESTRIL) 20 MG tablet Take 20 mg by mouth daily.   Yes Historical Provider, MD  sulfamethoxazole-trimethoprim (BACTRIM DS) 800-160 MG per tablet Take 1 tablet by mouth daily.  05/24/13  Yes Historical Provider,  MD  Tapentadol HCl (NUCYNTA) 75 MG TABS Take 75 mg by mouth every 6 (six) hours as needed (pain).   Yes Historical Provider, MD  zolpidem (AMBIEN) 10 MG tablet Take 10 mg by mouth at bedtime as needed. For sleep   Yes Historical Provider, MD   BP 121/87  Pulse 84  Temp(Src) 97.6 F (36.4 C) (Oral)  Resp 16  SpO2 99% Physical Exam 2050: Physical examination:  Nursing notes reviewed; Vital signs and O2 SAT reviewed;  Constitutional: Well developed, Well nourished,  Well hydrated, In no acute distress; Head:  Normocephalic, atraumatic; Eyes: EOMI, PERRL, No scleral icterus; ENMT: TM's clear bilat. +edemetous nasal turbinates bilat with clear rhinorrhea. +mild tenderness to percuss frontal sinus. Mouth and pharynx normal, Mucous membranes moist; Neck: Supple, Full range of motion, No lymphadenopathy; Cardiovascular: Regular rate and rhythm, No murmur, rub, or gallop; Respiratory: Breath sounds clear & equal bilaterally, No rales, rhonchi, wheezes.  Speaking full sentences with ease, Normal respiratory effort/excursion; Chest: Nontender, Movement normal; Abdomen: Soft, Nontender, Nondistended, Normal bowel sounds; Genitourinary: No CVA tenderness; Extremities: Pulses normal, No tenderness, No edema, No calf edema or asymmetry.; Neuro: AA&Ox3, Major CN grossly intact. No facial droop. Speech clear. No gross focal motor or sensory deficits in extremities.; Skin: Color normal, Warm, Dry.   ED Course  Procedures     MDM  MDM Reviewed: previous chart, nursing note and vitals    2100:  Pt has multiple narcotic pain medications at home: fentanyl, nucynta and another refill of hydrocodone (per [place] Controlled Substance Database). Will not rx another narcotic pain medication at this time. Long hx of chronic pain with multiple ED visits for same.  Pt endorses acute flair of her usual long standing chronic pain today, no change from her usual chronic pain pattern.  Pt encouraged to f/u with her PMD and Pain Management doctor for good continuity of care and control of her chronic pain.  Verb understanding.     Samuel Jester, DO 10/12/13 1743

## 2013-10-09 NOTE — ED Notes (Signed)
Pt reports "throbbing" HA in center of forehead starting appx 1 hour ago. States "it just comes and goes." States it progressively got worse, denies "thunderclap" sensation.  Hx of HA's, but states this feels different. Denies blurred vision, double vision. Reports nausea with 1 episode of emesis.  PERRLA, 3 mm. AO x4.

## 2014-02-10 ENCOUNTER — Encounter: Payer: Self-pay | Admitting: Women's Health

## 2014-02-10 ENCOUNTER — Other Ambulatory Visit (HOSPITAL_COMMUNITY)
Admission: RE | Admit: 2014-02-10 | Discharge: 2014-02-10 | Disposition: A | Discharge status: Home / Self Care | Payer: Managed Care, Other (non HMO) | Source: Ambulatory Visit | Attending: Women's Health | Admitting: Women's Health

## 2014-02-10 ENCOUNTER — Ambulatory Visit (INDEPENDENT_AMBULATORY_CARE_PROVIDER_SITE_OTHER): Payer: Managed Care, Other (non HMO) | Admitting: Women's Health

## 2014-02-10 VITALS — BP 126/80 | Ht 63.0 in | Wt 166.0 lb

## 2014-02-10 DIAGNOSIS — Z79890 Hormone replacement therapy (postmenopausal): Secondary | ICD-10-CM

## 2014-02-10 DIAGNOSIS — Z1151 Encounter for screening for human papillomavirus (HPV): Secondary | ICD-10-CM | POA: Insufficient documentation

## 2014-02-10 DIAGNOSIS — Z01419 Encounter for gynecological examination (general) (routine) without abnormal findings: Secondary | ICD-10-CM | POA: Diagnosis not present

## 2014-02-10 MED ORDER — PROGESTERONE MICRONIZED 200 MG PO CAPS: 200.0000 mg | ORAL_CAPSULE | Freq: Every day | ORAL | Status: DC

## 2014-02-10 MED ORDER — ESTRADIOL 0.05 MG/24HR TD PTTW: 1.0000 | MEDICATED_PATCH | TRANSDERMAL | Status: DC

## 2014-02-10 NOTE — Progress Notes (Signed)
Laura [name] Dec 08, 1965 [id number]    History:    Presents for annual exam.  Premature menopause on HRT, Activella with minimal symptom relief, continues with hot flushes and poor sleep. History of BTL. Not sexually active in years. 1998 abnormal Pap with normal Paps after. Hypertension and depression managed by primary care. Wegener's disease. Mother and maternal grandmother breast cancer unknown BRCA status, both survivors. Overdue for mammogram.  Past medical history, past surgical history, family history and social history were all reviewed and documented in the EPIC chart. 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 2 children both healthy.  ROS:  A ROS was performed and pertinent positives and negatives are included.  Exam:  Filed Vitals:   02/10/14 1501  BP: 126/80    General appearance:  Normal Thyroid:  Symmetrical, normal in size, without palpable masses or nodularity. Respiratory  Auscultation:  Clear without wheezing or rhonchi Cardiovascular  Auscultation:  Regular rate, without rubs, murmurs or gallops  Edema/varicosities:  Not grossly evident Abdominal  Soft,nontender, without masses, guarding or rebound.  Liver/spleen:  No organomegaly noted  Hernia:  None appreciated  Skin  Inspection:  Grossly normal   Breasts: Examined lying and sitting.     Right: Without masses, retractions, discharge or axillary adenopathy.     Left: Without masses, retractions, discharge or axillary adenopathy. Gentitourinary   Inguinal/mons:  Normal without inguinal adenopathy  External genitalia:  Normal  BUS/Urethra/Skene's glands:  Normal  Vagina:  Normal  Cervix:  Normal  Uterus:   normal in size, shape and contour.  Midline and mobile  Adnexa/parametria:     Rt: Without masses or tenderness.   Lt: Without masses or tenderness.  Anus and perineum: Normal  Digital rectal exam: Normal sphincter tone without palpated masses or tenderness  Assessment/Plan:  48 y.o. SBF G2P2 for annual exam  with menopausal symptoms.  Postmenopausal/no bleeding on HRT Hypertension/depression-primary care manages labs and meds  Plan: HRT reviewed risks of blood clots, strokes, breast cancer, will try the Vivelle .05 patch twice weekly, Prometrium 200 at bedtime day 1 through 12 of each month. Instructed to call if no relief of symptoms. Reviewed may see bleeding after Prometrium. SBE's, reviewed importance of annual screening, 3-D tomographyencouraged due to history family history. Breast center information given, instructed to schedule. Encouraged to discussed BRCA testing with mother. Encouraged regular exercise, calcium rich diet, vitamin D 1000 daily. Pap with HR HPV typing, new screening guidelines reviewed.    Huel Cote [facility], 5:12 PM 02/10/2014

## 2014-02-10 NOTE — Patient Instructions (Signed)
Health Recommendations for Postmenopausal Women Respected and ongoing research has looked at the most common causes of death, disability, and poor quality of life in postmenopausal women. The causes include heart disease, diseases of blood vessels, diabetes, depression, cancer, and bone loss (osteoporosis). Many things can be done to help lower the chances of developing these and other common problems. CARDIOVASCULAR DISEASE Heart Disease: A heart attack is a medical emergency. Know the signs and symptoms of a heart attack. Below are things women can do to reduce their risk for heart disease.   Do not smoke. If you smoke, quit.  Aim for a healthy weight. Being overweight causes many preventable deaths. Eat a healthy and balanced diet and drink an adequate amount of liquids.  Get moving. Make a commitment to be more physically active. Aim for 30 minutes of activity on most, if not all days of the week.  Eat for heart health. Choose a diet that is low in saturated fat and cholesterol and eliminate trans fat. Include whole grains, vegetables, and fruits. Read and understand the labels on food containers before buying.  Know your numbers. Ask your caregiver to check your blood pressure, cholesterol (total, HDL, LDL, triglycerides) and blood glucose. Work with your caregiver on improving your entire clinical picture.  High blood pressure. Limit or stop your table salt intake (try salt substitute and food seasonings). Avoid salty foods and drinks. Read labels on food containers before buying. Eating well and exercising can help control high blood pressure. STROKE  Stroke is a medical emergency. Stroke may be the result of a blood clot in a blood vessel in the brain or by a brain hemorrhage (bleeding). Know the signs and symptoms of a stroke. To lower the risk of developing a stroke:  Avoid fatty foods.  Quit smoking.  Control your diabetes, blood pressure, and irregular heart rate. THROMBOPHLEBITIS  (BLOOD CLOT) OF THE LEG  Becoming overweight and leading a stationary lifestyle may also contribute to developing blood clots. Controlling your diet and exercising will help lower the risk of developing blood clots. CANCER SCREENING  Breast Cancer: Take steps to reduce your risk of breast cancer.  You should practice "breast self-awareness." This means understanding the normal appearance and feel of your breasts and should include breast self-examination. Any changes detected, no matter how small, should be reported to your caregiver.  After age 40, you should have a clinical breast exam (CBE) every year.  Starting at age 40, you should consider having a mammogram (breast X-ray) every year.  If you have a family history of breast cancer, talk to your caregiver about genetic screening.  If you are at high risk for breast cancer, talk to your caregiver about having an MRI and a mammogram every year.  Intestinal or Stomach Cancer: Tests to consider are a rectal exam, fecal occult blood, sigmoidoscopy, and colonoscopy. Women who are high risk may need to be screened at an earlier age and more often.  Cervical Cancer:  Beginning at age 30, you should have a Pap test every 3 years as long as the past 3 Pap tests have been normal.  If you have had past treatment for cervical cancer or a condition that could lead to cancer, you need Pap tests and screening for cancer for at least 20 years after your treatment.  If you had a hysterectomy for a problem that was not cancer or a condition that could lead to cancer, then you no longer need Pap tests.    If you are between ages 65 and 70, and you have had normal Pap tests going back 10 years, you no longer need Pap tests.  If Pap tests have been discontinued, risk factors (such as a new sexual partner) need to be reassessed to determine if screening should be resumed.  Some medical problems can increase the chance of getting cervical cancer. In these  cases, your caregiver may recommend more frequent screening and Pap tests.  Uterine Cancer: If you have vaginal bleeding after reaching menopause, you should notify your caregiver.  Ovarian Cancer: Other than yearly pelvic exams, there are no reliable tests available to screen for ovarian cancer at this time except for yearly pelvic exams.  Lung Cancer: Yearly chest X-rays can detect lung cancer and should be done on high risk women, such as cigarette smokers and women with chronic lung disease (emphysema).  Skin Cancer: A complete body skin exam should be done at your yearly examination. Avoid overexposure to the sun and ultraviolet light lamps. Use a strong sun block cream when in the sun. All of these things are important for lowering the risk of skin cancer. MENOPAUSE Menopause Symptoms: Hormone therapy products are effective for treating symptoms associated with menopause:  Moderate to severe hot flashes.  Night sweats.  Mood swings.  Headaches.  Tiredness.  Loss of sex drive.  Insomnia.  Other symptoms. Hormone replacement carries certain risks, especially in older women. Women who use or are thinking about using estrogen or estrogen with progestin treatments should discuss that with their caregiver. Your caregiver will help you understand the benefits and risks. The ideal dose of hormone replacement therapy is not known. The Food and Drug Administration (FDA) has concluded that hormone therapy should be used only at the lowest doses and for the shortest amount of time to reach treatment goals.  OSTEOPOROSIS Protecting Against Bone Loss and Preventing Fracture If you use hormone therapy for prevention of bone loss (osteoporosis), the risks for bone loss must outweigh the risk of the therapy. Ask your caregiver about other medications known to be safe and effective for preventing bone loss and fractures. To guard against bone loss or fractures, the following is recommended:  If  you are younger than age 50, take 1000 mg of calcium and at least 600 mg of Vitamin D per day.  If you are older than age 50 but younger than age 70, take 1200 mg of calcium and at least 600 mg of Vitamin D per day.  If you are older than age 70, take 1200 mg of calcium and at least 800 mg of Vitamin D per day. Smoking and excessive alcohol intake increases the risk of osteoporosis. Eat foods rich in calcium and vitamin D and do weight bearing exercises several times a week as your caregiver suggests. DIABETES Diabetes Mellitus: If you have type I or type 2 diabetes, you should keep your blood sugar under control with diet, exercise, and recommended medication. Avoid starchy and fatty foods, and too many sweets. Being overweight can make diabetes control more difficult. COGNITION AND MEMORY Cognition and Memory: Menopausal hormone therapy is not recommended for the prevention of cognitive disorders such as Alzheimer's disease or memory loss.  DEPRESSION  Depression may occur at any age, but it is common in elderly women. This may be because of physical, medical, social (loneliness), or financial problems and needs. If you are experiencing depression because of medical problems and control of symptoms, talk to your caregiver about this. Physical   activity and exercise may help with mood and sleep. Community and volunteer involvement may improve your sense of value and worth. If you have depression and you feel that the problem is getting worse or becoming severe, talk to your caregiver about which treatment options are best for you. ACCIDENTS  Accidents are common and can be serious in elderly woman. Prepare your house to prevent accidents. Eliminate throw rugs, place hand bars in bath, shower, and toilet areas. Avoid wearing high heeled shoes or walking on wet, snowy, and icy areas. Limit or stop driving if you have vision or hearing problems, or if you feel you are unsteady with your movements and  reflexes. HEPATITIS C Hepatitis C is a type of viral infection affecting the liver. It is spread mainly through contact with blood from an infected person. It can be treated, but if left untreated, it can lead to severe liver damage over the years. Many people who are infected do not know that the virus is in their blood. If you are a "baby-boomer", it is recommended that you have one screening test for Hepatitis C. IMMUNIZATIONS  Several immunizations are important to consider having during your senior years, including:   Tetanus, diphtheria, and pertussis booster shot.  Influenza every year before the flu season begins.  Pneumonia vaccine.  Shingles vaccine.  Others, as indicated based on your specific needs. Talk to your caregiver about these. Document Released: 04/05/2005 Document Revised: 06/28/2013 Document Reviewed: 11/30/2007 [employer] Patient Information 2015 [employer]. This information is not intended to replace advice given to you by your health care provider. Make sure you discuss any questions you have with your health care provider.  

## 2014-02-11 LAB — URINALYSIS W MICROSCOPIC + REFLEX CULTURE
Bacteria, UA: NONE SEEN
Bilirubin Urine: NEGATIVE
Casts: NONE SEEN
Crystals: NONE SEEN
GLUCOSE, UA: NEGATIVE mg/dL
HGB URINE DIPSTICK: NEGATIVE
Ketones, ur: NEGATIVE mg/dL
Nitrite: NEGATIVE
PH: 6 (ref 5.0–8.0)
Protein, ur: NEGATIVE mg/dL
Specific Gravity, Urine: >1.03 — ABNORMAL HIGH (ref 1.005–1.030)
Urobilinogen, UA: 0.2 mg/dL (ref 0.0–1.0)

## 2014-02-12 LAB — URINE CULTURE
COLONY COUNT: NO GROWTH
Organism ID, Bacteria: NO GROWTH

## 2014-02-14 LAB — CYTOLOGY - PAP

## 2014-08-04 IMAGING — CR DG CHEST 2V
2 series · 2 of 2 positions shown · non-contrast
Comparison: 11/09/2012

CLINICAL DATA: Chest pain.

EXAM:
CHEST  2 VIEW

[w chest pa]
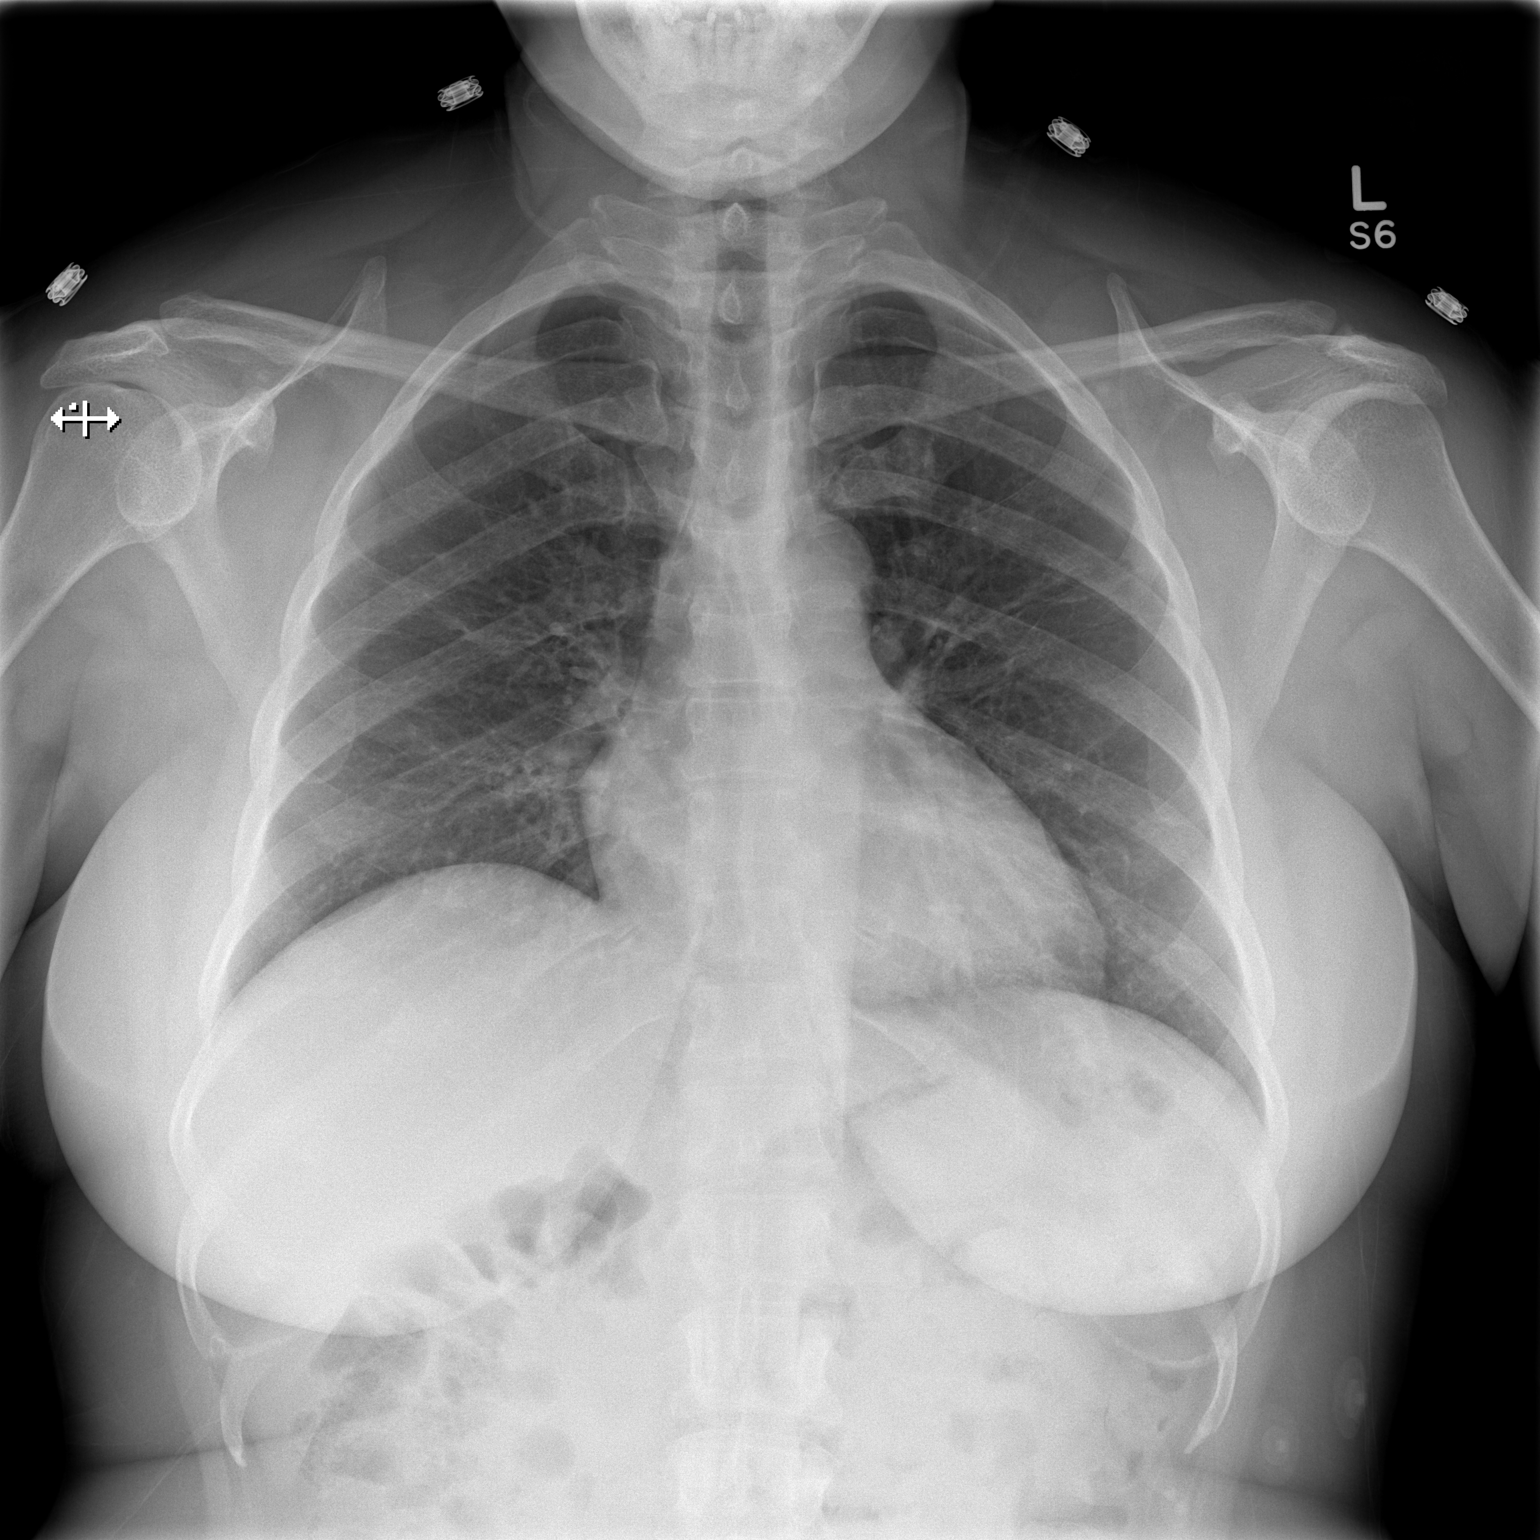

[w chest lat]
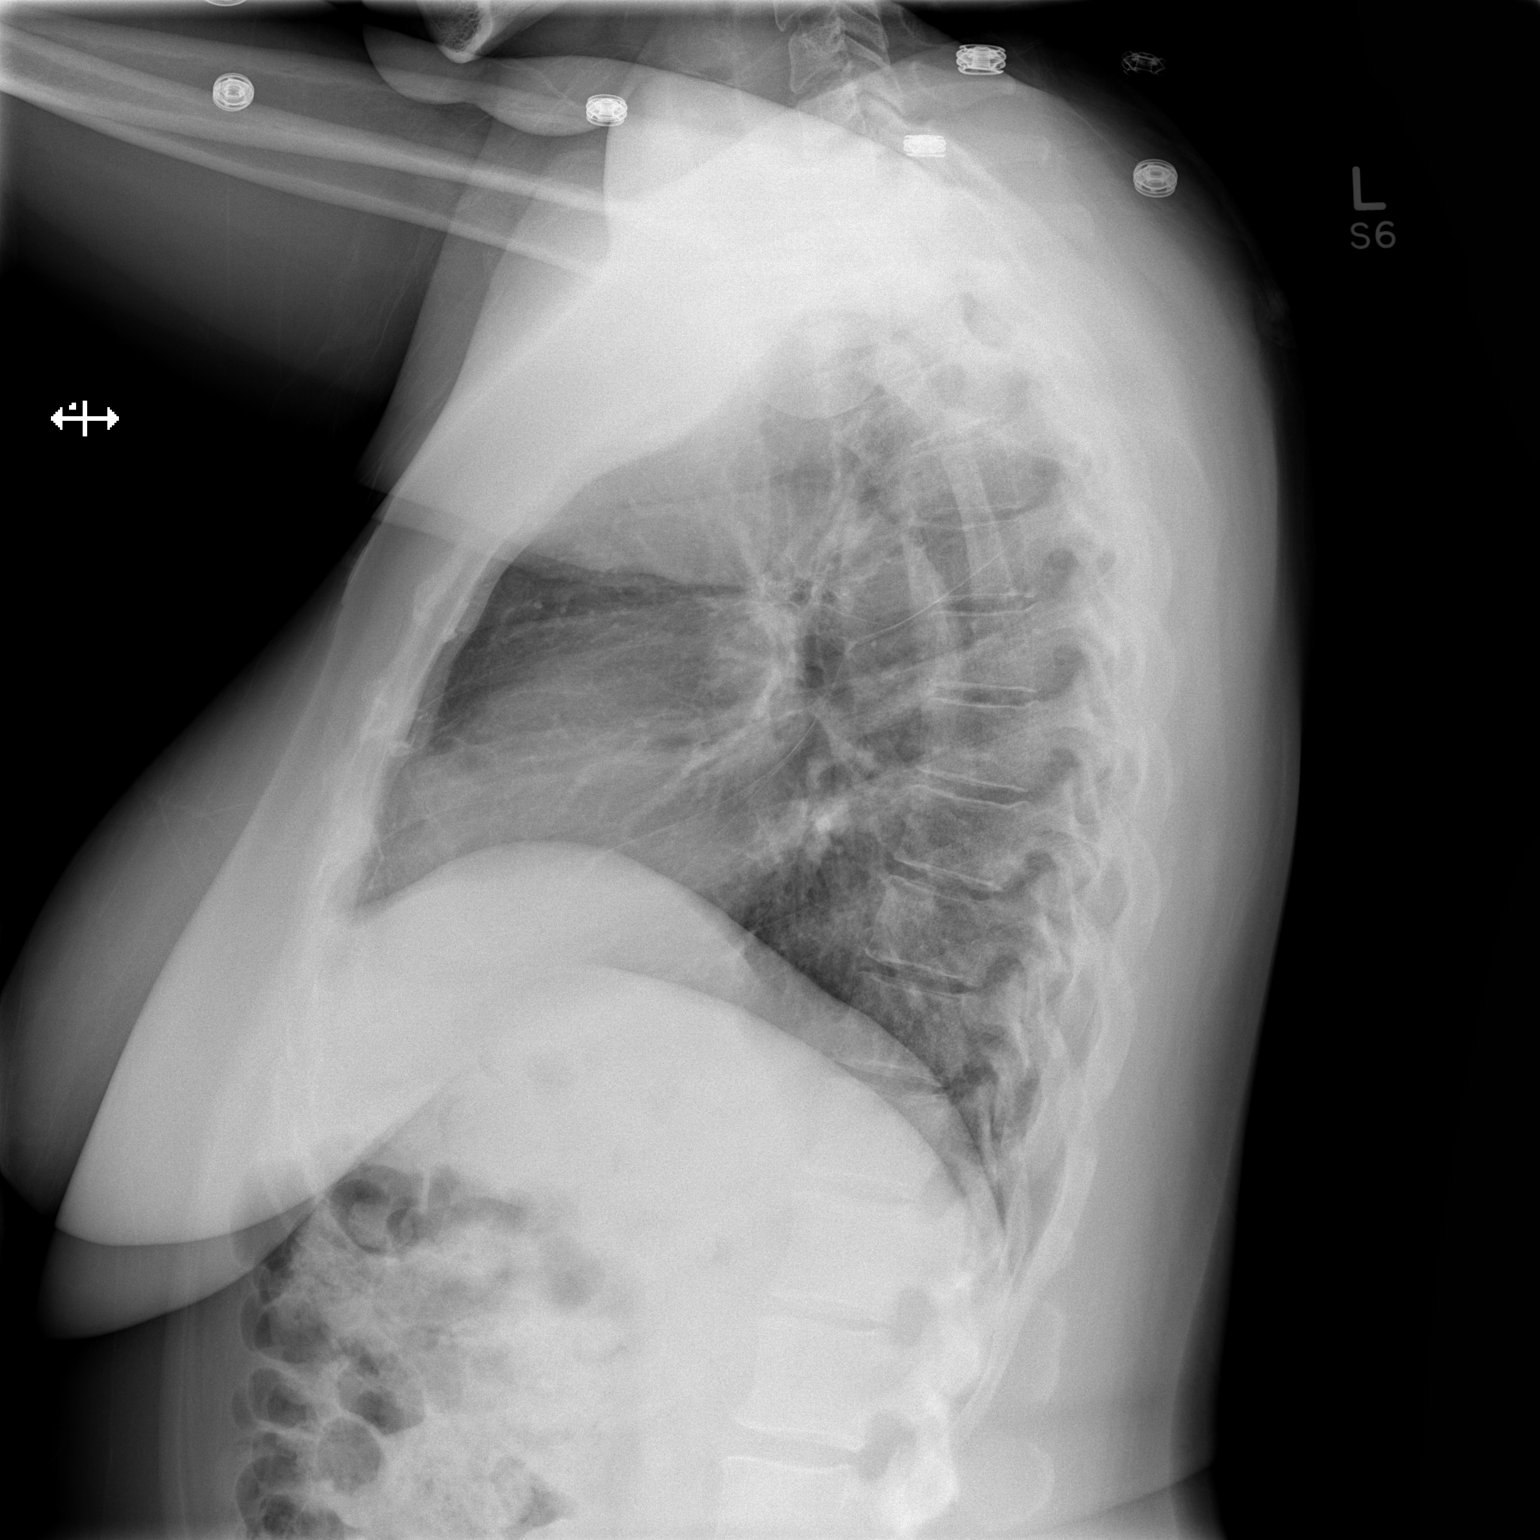

[2 of 2 positions shown; findings below may reference images not displayed]

FINDINGS: The heart size and mediastinal contours are within normal limits.
Both lungs are clear. The visualized skeletal structures are
unremarkable.
IMPRESSION: No active cardiopulmonary disease.

## 2014-08-04 IMAGING — CT CT ABD-PELV W/ CM
2 of 5 series · 17 of 46 positions shown, 19 images · IV contrast (omnipaque)
Comparison: Prior ultrasound from 04/04/2010

CLINICAL DATA: Pain

CT ABDOMEN AND PELVIS WITH CONTRAST
TECHNIQUE: Multidetector CT imaging of the abdomen and pelvis was
performed following the standard protocol during bolus
administration of intravenous contrast.
Contrast: 100mL OMNIPAQUE IOHEXOL 300 MG/ML  SOLN

[Series 2: routine · axial · 0.73mm/px · z∈[+409,+834]mm · 14 of 97 slices shown, 16 images]
[im 6/97  soft-tissue]
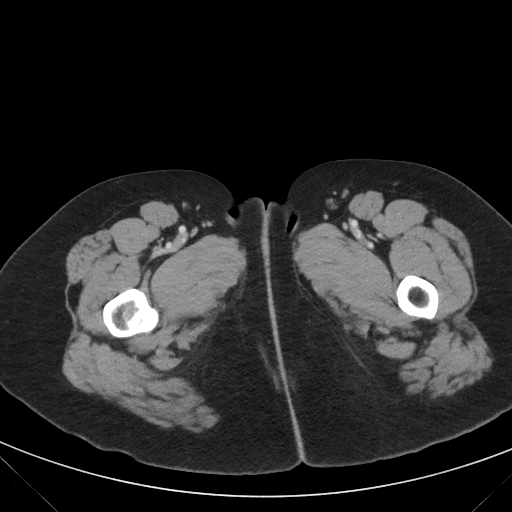
[im 6/97  bone]
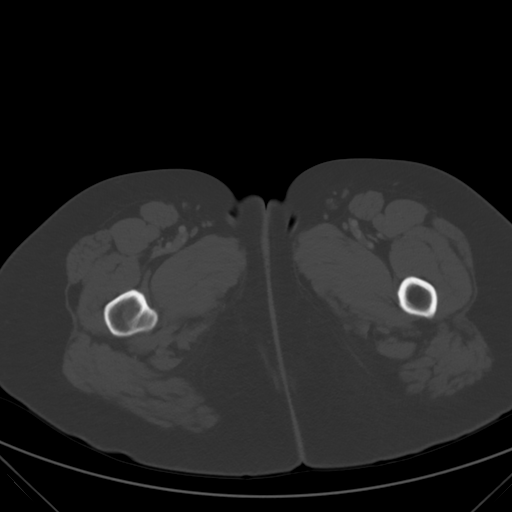
[im 11/97  soft-tissue]
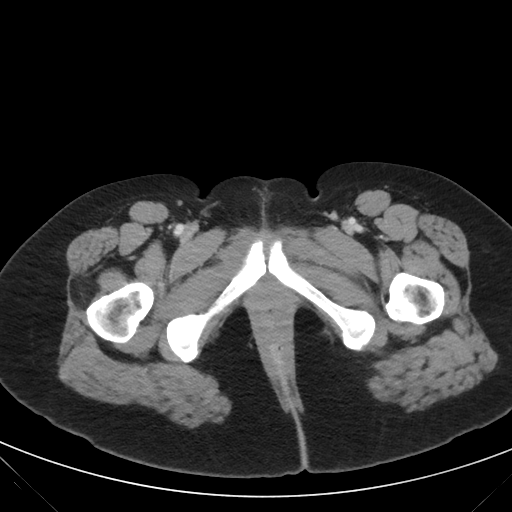
[im 22/97  soft-tissue]
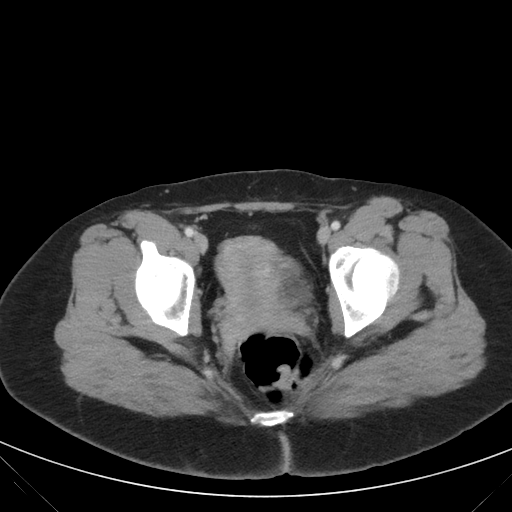
[im 27/97  soft-tissue]
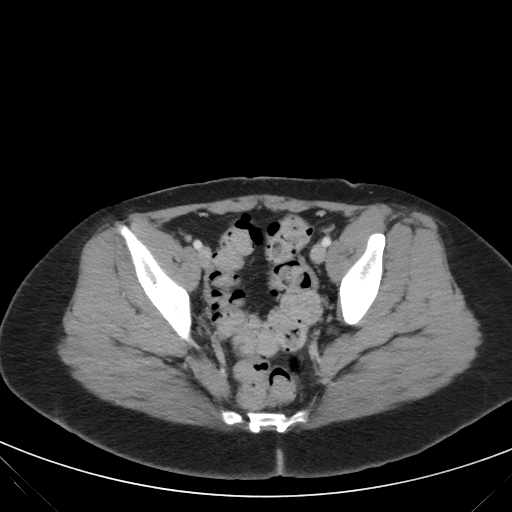
[im 33/97  soft-tissue]
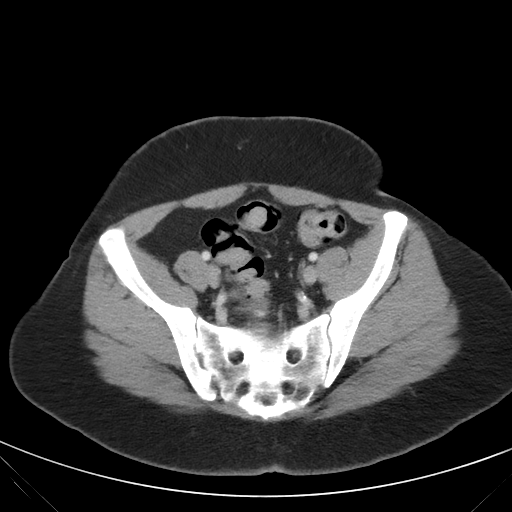
[im 38/97  soft-tissue]
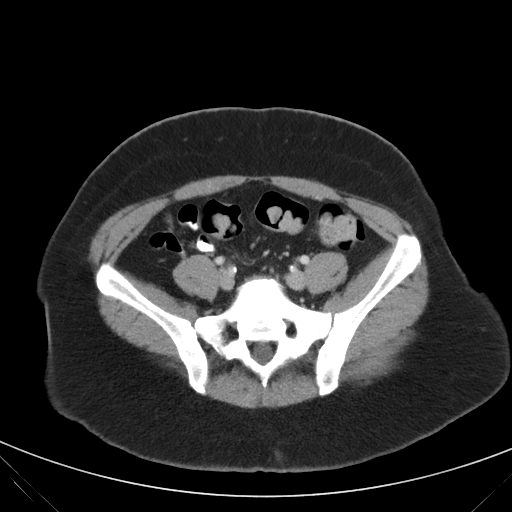
[im 43/97  soft-tissue]
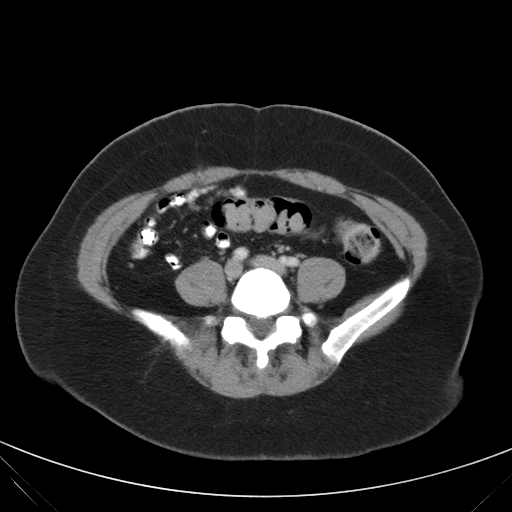
[im 54/97  soft-tissue]
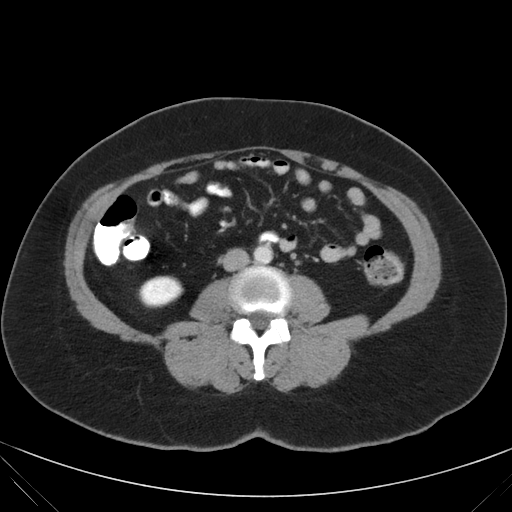
[im 59/97  soft-tissue]
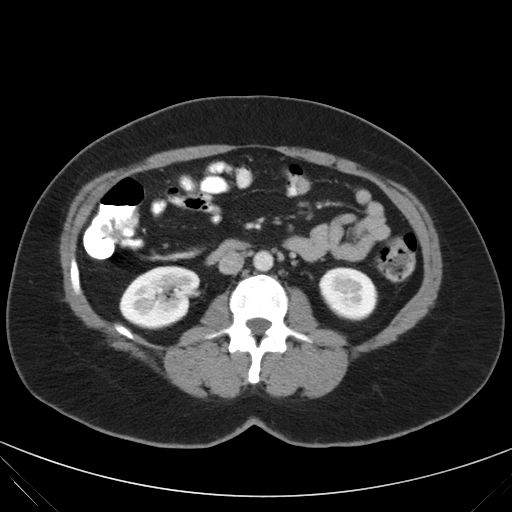
[im 59/97  bone]
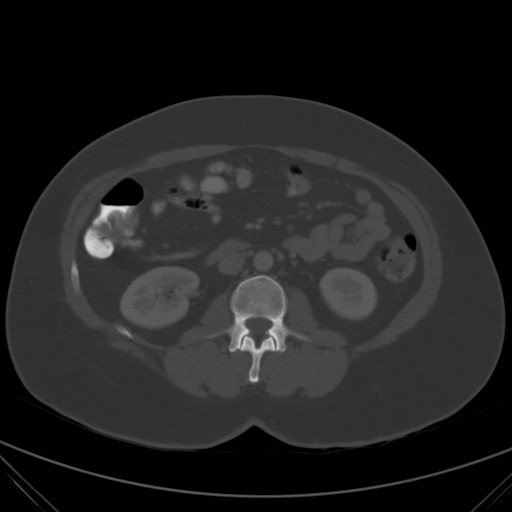
[im 65/97  soft-tissue]
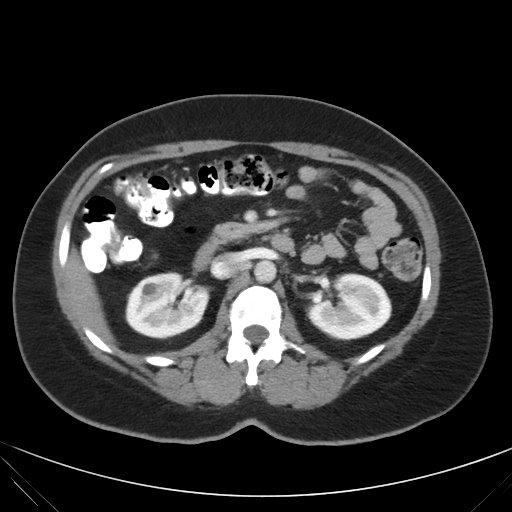
[im 70/97  soft-tissue]
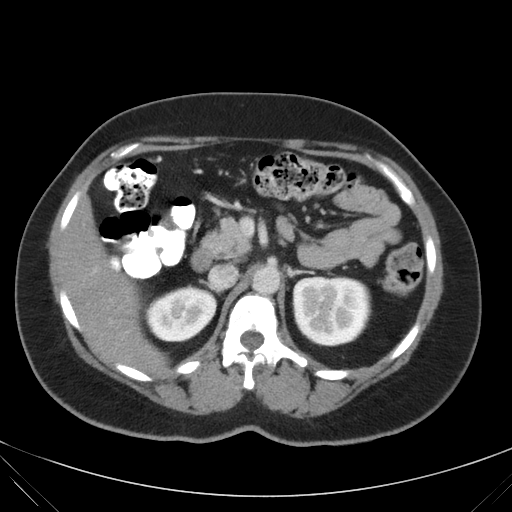
[im 75/97  soft-tissue]
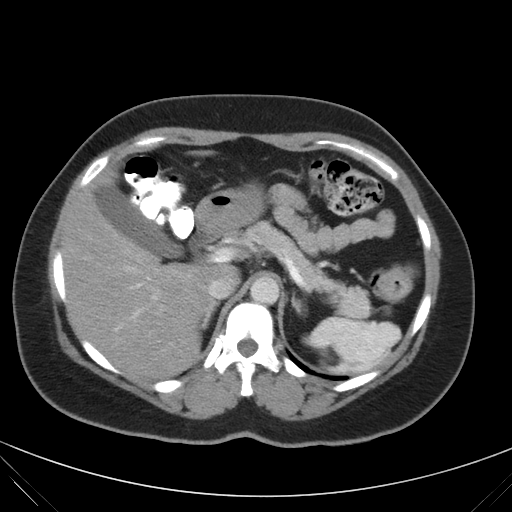
[im 86/97  soft-tissue]
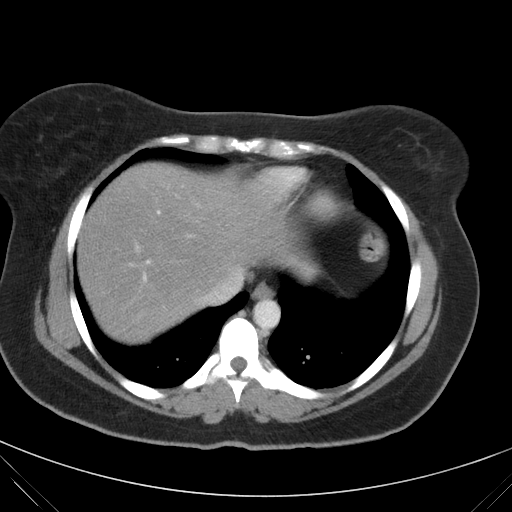
[im 91/97  soft-tissue]
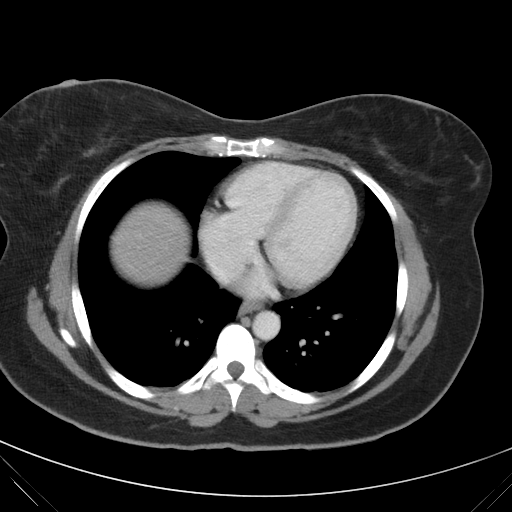

[coronals · coronal · 0.94mm/px · 3 of 88 slices shown]
[im 30/88  soft-tissue]
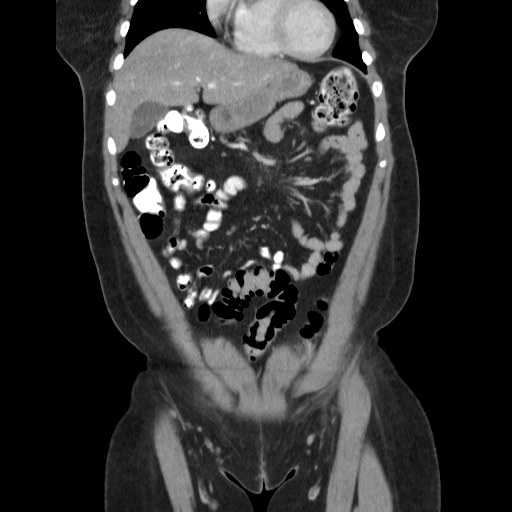
[im 39/88  soft-tissue]
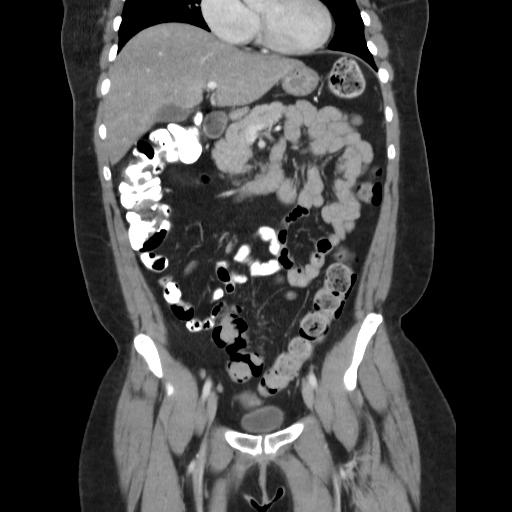
[im 49/88  soft-tissue]
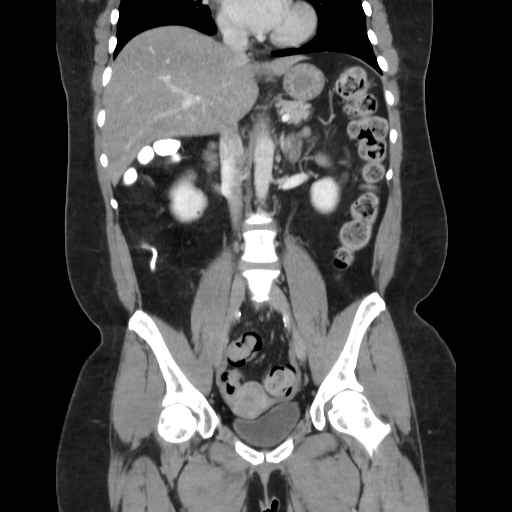

[17 of 46 positions shown; findings below may reference images not displayed]

FINDINGS: Mild dependent atelectasis is present within the lung
bases.

The liver is unremarkable.  Gallbladder is within normal limits.
No biliary ductal dilatation.  The spleen demonstrates a somewhat
lobulated contour but is otherwise unremarkable.  Adrenal glands
and pancreas demonstrate a normal contrast enhanced appearance.  No
pancreatic ductal dilatation.

The kidneys are within normal limits with symmetric enhancement.
No nephrolithiasis, hydronephrosis, or focal renal mass.

The stomach is normal.  There is no evidence of bowel obstruction.
Appendix is well visualized in the right lower quadrant and is of
normal caliber and appearance without associate inflammatory
changes.

No abnormal wall thickening enhancement is seen about the bowels to
suggest underlying inflammation.

Bladder is unremarkable.  Uterus and ovaries are within normal
limits.

No free air or fluid.  No pathologically enlarged intra-abdominal
pelvic lymph nodes are seen.

Scattered calcified atheromatous disease is seen within the intra-
abdominal aorta and its branch vessels.

No acute osseous abnormality identified.
IMPRESSION: No CT evidence of acute intra-abdominal pelvic process.

## 2014-10-08 ENCOUNTER — Emergency Department (HOSPITAL_COMMUNITY)
Admission: EM | Admit: 2014-10-08 | Discharge: 2014-10-08 | Disposition: A | Discharge status: Home / Self Care | Payer: Managed Care, Other (non HMO) | Source: Home / Self Care | Attending: Family Medicine | Admitting: Family Medicine

## 2014-10-08 ENCOUNTER — Encounter (HOSPITAL_COMMUNITY): Payer: Self-pay | Admitting: Emergency Medicine

## 2014-10-08 ENCOUNTER — Emergency Department (INDEPENDENT_AMBULATORY_CARE_PROVIDER_SITE_OTHER): Payer: Managed Care, Other (non HMO)

## 2014-10-08 DIAGNOSIS — M791 Myalgia, unspecified site: Secondary | ICD-10-CM

## 2014-10-08 DIAGNOSIS — M25552 Pain in left hip: Secondary | ICD-10-CM | POA: Diagnosis not present

## 2014-10-08 NOTE — Discharge Instructions (Signed)
Hip Pain Your hip is the joint between your upper legs and your lower pelvis. The bones, cartilage, tendons, and muscles of your hip joint perform a lot of work each day supporting your body weight and allowing you to move around. Hip pain can range from a minor ache to severe pain in one or both of your hips. Pain may be felt on the inside of the hip joint near the groin, or the outside near the buttocks and upper thigh. You may have swelling or stiffness as well.  HOME CARE INSTRUCTIONS   Take medicines only as directed by your health care provider.  Apply ice to the injured area:  Put ice in a plastic bag.  Place a towel between your skin and the bag.  Leave the ice on for 15-20 minutes at a time, 3-4 times a day.  Keep your leg raised (elevated) when possible to lessen swelling.  Avoid activities that cause pain.  Follow specific exercises as directed by your health care provider.  Sleep with a pillow between your legs on your most comfortable side.  Record how often you have hip pain, the location of the pain, and what it feels like. SEEK MEDICAL CARE IF:   You are unable to put weight on your leg.  Your hip is red or swollen or very tender to touch.  Your pain or swelling continues or worsens after 1 week.  You have increasing difficulty walking.  You have a fever. SEEK IMMEDIATE MEDICAL CARE IF:   You have fallen.  You have a sudden increase in pain and swelling in your hip. MAKE SURE YOU:   Understand these instructions.  Will watch your condition.  Will get help right away if you are not doing well or get worse. Document Released: 08/01/2009 Document Revised: 06/28/2013 Document Reviewed: 10/08/2012 [facility] Patient Information 2015 [place], [place]. This information is not intended to replace advice given to you by your health care provider. Make sure you discuss any questions you have with your health care provider.  Muscle Pain Muscle pain (myalgia) may  be caused by many things, including:  Overuse or muscle strain, especially if you are not in shape. This is the most common cause of muscle pain.  Injury.  Bruises.  Viruses, such as the flu.  Infectious diseases.  Fibromyalgia, which is a chronic condition that causes muscle tenderness, fatigue, and headache.  Autoimmune diseases, including lupus.  Certain drugs, including ACE inhibitors and statins. Muscle pain may be mild or severe. In most cases, the pain lasts only a short time and goes away without treatment. To diagnose the cause of your muscle pain, your health care provider will take your medical history. This means he or she will ask you when your muscle pain began and what has been happening. If you have not had muscle pain for very long, your health care provider may want to wait before doing much testing. If your muscle pain has lasted a long time, your health care provider may want to run tests right away. If your health care provider thinks your muscle pain may be caused by illness, you may need to have additional tests to rule out certain conditions.  Treatment for muscle pain depends on the cause. Home care is often enough to relieve muscle pain. Your health care provider may also prescribe anti-inflammatory medicine. HOME CARE INSTRUCTIONS Watch your condition for any changes. The following actions may help to lessen any discomfort you are feeling:  Only take  over-the-counter or prescription medicines as directed by your health care provider.  Apply ice to the sore muscle:  Put ice in a plastic bag.  Place a towel between your skin and the bag.  Leave the ice on for 15-20 minutes, 3-4 times a day.  You may alternate applying hot and cold packs to the muscle as directed by your health care provider.  If overuse is causing your muscle pain, slow down your activities until the pain goes away.  Remember that it is normal to feel some muscle pain after starting a  workout program. Muscles that have not been used often will be sore at first.  Do regular, gentle exercises if you are not usually active.  Warm up before exercising to lower your risk of muscle pain.  Do not continue working out if the pain is very bad. Bad pain could mean you have injured a muscle. SEEK MEDICAL CARE IF:  Your muscle pain gets worse, and medicines do not help.  You have muscle pain that lasts longer than 3 days.  You have a rash or fever along with muscle pain.  You have muscle pain after a tick bite.  You have muscle pain while working out, even though you are in good physical condition.  You have redness, soreness, or swelling along with muscle pain.  You have muscle pain after starting a new medicine or changing the dose of a medicine. SEEK IMMEDIATE MEDICAL CARE IF:  You have trouble breathing.  You have trouble swallowing.  You have muscle pain along with a stiff neck, fever, and vomiting.  You have severe muscle weakness or cannot move part of your body. MAKE SURE YOU:   Understand these instructions.  Will watch your condition.  Will get help right away if you are not doing well or get worse. Document Released: 01/03/2006 Document Revised: 02/16/2013 Document Reviewed: 12/08/2012 [facility] Patient Information 2015 [place], [place]. This information is not intended to replace advice given to you by your health care provider. Make sure you discuss any questions you have with your health care provider.

## 2014-10-08 NOTE — ED Provider Notes (Signed)
CSN: [id number]     Arrival date & time 10/08/14  1618 History   First MD Initiated Contact with Patient 10/08/14 1650     Chief Complaint  Patient presents with  . Hip Pain   (Consider location/radiation/quality/duration/timing/severity/associated sxs/prior Treatment) HPI Comments: 49 year old female developed sudden severe pain to the left hip yesterday. She states it is sharp. It is initiated with standing. It is ameliorated or stopped with sitting. Pain is located in the left lateral hip.   Past Medical History  Diagnosis Date  . Mononeuritis multiplex   . Unspecified hereditary and idiopathic peripheral neuropathy   . Hearing loss   . Rash   . Joint pain   . Ischemic bowel disease   . Cyst   . Wegener's granulomatosis   . Nasal bleeding   . Hypertension   . Hypothyroidism   . Anxiety     generalized  . Chronic headache   . Chronic abdominal pain   . Chronic foot pain    Past Surgical History  Procedure Laterality Date  . Tubal ligation  1992   Family History  Problem Relation Age of Onset  . Diabetes Mother   . Hypertension Mother   . Breast cancer Mother   . Diabetes Sister   . Hypertension Sister    Social History  Substance Use Topics  . Smoking status: Former Smoker    Quit date: 05/06/2007  . Smokeless tobacco: Never Used  . Alcohol Use: No   OB History    No data available     Review of Systems  Constitutional: Negative.  Negative for fever, chills and activity change.  Cardiovascular: Negative.   Musculoskeletal: Positive for arthralgias.       As per HPI  Skin: Negative for color change, pallor and rash.  Neurological: Negative.     Allergies  Review of patient's allergies indicates no known allergies.  Home Medications   Prior to Admission medications   Medication Sig Start Date End Date Taking? Authorizing Provider  azaTHIOprine (IMURAN) 50 MG tablet Take 100 mg by mouth 2 (two) times daily.    Historical Provider, MD  estradiol  (VIVELLE-DOT) 0.05 MG/24HR patch Place 1 patch (0.05 mg total) onto the skin 2 (two) times a week. 02/10/14   Harrington Challenger, NP  fentaNYL (DURAGESIC - DOSED MCG/HR) 100 MCG/HR Place 100 mcg onto the skin every 3 (three) days.    Historical Provider, MD  lisinopril (PRINIVIL,ZESTRIL) 20 MG tablet Take 20 mg by mouth daily.    Historical Provider, MD  progesterone (PROMETRIUM) 200 MG capsule Take 1 capsule (200 mg total) by mouth daily. 02/10/14   Harrington Challenger, NP  promethazine (PHENERGAN) 25 MG tablet Take 1 tablet (25 mg total) by mouth every 6 (six) hours as needed for nausea or vomiting (or headache). 10/09/13   Samuel Jester, DO  sulfamethoxazole-trimethoprim (BACTRIM DS) 800-160 MG per tablet Take 1 tablet by mouth daily.  05/24/13   Historical Provider, MD  Tapentadol HCl (NUCYNTA) 75 MG TABS Take 75 mg by mouth every 6 (six) hours as needed (pain).    Historical Provider, MD  zolpidem (AMBIEN) 10 MG tablet Take 10 mg by mouth at bedtime as needed. For sleep    Historical Provider, MD   BP 124/80 mmHg  Pulse 83  Temp(Src) 97.5 F (36.4 C) (Oral)  Resp 18  SpO2 95% Physical Exam  Constitutional: She is oriented to person, place, and time. She appears well-developed and well-nourished. No distress.  HENT:  Head: Normocephalic and atraumatic.  Eyes: EOM are normal.  Pulmonary/Chest: Effort normal.  Musculoskeletal:  Having the patient stand she is able to stand on the left hip with minimal support but with some pain. She is able to stand on the right leg and abduct the left leg to 30. She is able to flex the help forward and posteriorly with minimal discomfort. Examining the patient while supine and having her do a straight leg raise with the left leg produces severe pain in the left lateral hip. Abduction produces severe pain. Passive elevation of the left lower extremity with lifting anteriorly and with abduction produces pain to the left hip.  Neurological: She is alert and oriented  to person, place, and time. No cranial nerve deficit.  Skin: Skin is warm and dry.  Nursing note and vitals reviewed.   ED Course  Procedures (including critical care time) Labs Review Labs Reviewed - No data to display  Imaging Review Dg Hip Unilat With Pelvis 2-3 Views Left  10/08/2014   CLINICAL DATA:  Lateral left hip pain for 2 days.  EXAM: DG HIP (WITH OR WITHOUT PELVIS) 2-3V LEFT  COMPARISON:  None.  FINDINGS: There is no evidence of hip fracture or dislocation. There is no evidence of arthropathy or other focal bone abnormality.  IMPRESSION: Negative left hip x-ray.   Electronically Signed   By: Marin Roberts M.D.   On: 10/08/2014 17:50     MDM   1. Hip pain, acute, left   2. Hip pain, left   3. Muscle pain    Home, rest, ice, take your medication Motrin, see your doctor next week as needed  Hayden Rasmussen, NP 10/08/14 1814

## 2014-10-08 NOTE — ED Notes (Signed)
Pt comes in with sudden left hip intermit sharp pains upon waking this am Denies injury or strain Tried heat pad, ice without relief Worsens with stand,bend

## 2014-10-11 ENCOUNTER — Ambulatory Visit (INDEPENDENT_AMBULATORY_CARE_PROVIDER_SITE_OTHER): Payer: Managed Care, Other (non HMO) | Admitting: Internal Medicine

## 2014-10-11 VITALS — BP 118/74 | HR 74 | Temp 98.7°F | Resp 18 | Ht 63.0 in | Wt 175.0 lb

## 2014-10-11 DIAGNOSIS — M25552 Pain in left hip: Secondary | ICD-10-CM

## 2014-10-11 MED ORDER — MELOXICAM 15 MG PO TABS: 15.0000 mg | ORAL_TABLET | Freq: Every day | ORAL | Status: DC

## 2014-10-11 NOTE — Progress Notes (Signed)
Subjective:    Patient ID: Laura [name], female    DOB: Jul 23, 1965, 49 y.o.   MRN: [id number]  HPI    Review of Systems     Objective:   Physical Exam        Assessment & Plan:   [facility] [address], [place] [address] [phone]  Date:  10/11/2014   Name:  Laura [name]   DOB:  [birth year]   MRN:  [id number]  PCP:  Elby Showers, MD    Chief Complaint: Hip Pain   History of Present Illness:  Laura [name] is a 49 y.o. very pleasant female patient who presents with the following:  Left hip pain Started Friday Characterized as sharp on outside of hip  Radiates down to knee Denies numbness or tingling No known injury, just woke up with pain Never had pain in that hip before Works at an assisted living facility Having trouble with weight bearing Pain with laying on left hip No back pain No dysuria, no incontinence, no N/V/D/abd pain No CP or SOB Went to urgent care on [address] and got an xray that showed no bony abnormality that it was a "soft tissue issue" Instructed to do heat and ice and take ibuprofen No help with ibuprofen   Patient Active Problem List   Diagnosis Date Noted  . 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 Wegener's granulomatosis 08/15/2011  . Hypertension   . Hypothyroidism   . Anxiety   . Mononeuritis lower limb 06/14/2011  . Chronic pain syndrome 06/14/2011    Past Medical History  Diagnosis Date  . Mononeuritis multiplex   . Unspecified hereditary and idiopathic peripheral neuropathy   . Hearing loss   . Rash   . Joint pain   . Ischemic bowel disease   . Cyst   . Wegener's granulomatosis   . Nasal bleeding   . Hypertension   . Hypothyroidism   . Anxiety     generalized  . Chronic headache   . Chronic abdominal pain   . Chronic foot pain     Past Surgical History  Procedure Laterality Date  . Tubal ligation  1992    Social History  Substance Use  Topics  . Smoking status: Former Smoker    Quit date: 05/06/2007  . Smokeless tobacco: Never Used  . Alcohol Use: No    Family History  Problem Relation Age of Onset  . Diabetes Mother   . Hypertension Mother   . Breast cancer Mother   . Diabetes Sister   . Hypertension Sister     No Known Allergies  Medication list has been reviewed and updated.  Current Outpatient Prescriptions on File Prior to Visit  Medication Sig Dispense Refill  . azaTHIOprine (IMURAN) 50 MG tablet Take 100 mg by mouth 2 (two) times daily.    Marland Kitchen estradiol (VIVELLE-DOT) 0.05 MG/24HR patch Place 1 patch (0.05 mg total) onto the skin 2 (two) times a week. 8 patch 12  . fentaNYL (DURAGESIC - DOSED MCG/HR) 100 MCG/HR Place 100 mcg onto the skin every 3 (three) days.    Marland Kitchen lisinopril (PRINIVIL,ZESTRIL) 20 MG tablet Take 20 mg by mouth daily.    . progesterone (PROMETRIUM) 200 MG capsule Take 1 capsule (200 mg total) by mouth daily. 12 capsule 12  . promethazine (PHENERGAN) 25 MG tablet Take 1 tablet (25 mg total) by mouth every 6 (six) hours as needed for nausea or vomiting (or headache).  8 tablet 0  . sulfamethoxazole-trimethoprim (BACTRIM DS) 800-160 MG per tablet Take 1 tablet by mouth daily.     . Tapentadol HCl (NUCYNTA) 75 MG TABS Take 75 mg by mouth every 6 (six) hours as needed (pain).    Marland Kitchen zolpidem (AMBIEN) 10 MG tablet Take 10 mg by mouth at bedtime as needed. For sleep     No current facility-administered medications on file prior to visit.    Review of Systems:  All other pertinent ROS negative except as seen in HPI  Physical Examination: Filed Vitals:   10/11/14 1654  BP: 118/74  Pulse: 74  Temp: 98.7 F (37.1 C)  Resp: 18   Filed Vitals:   10/11/14 1654  Height:  (1.6 m)  Weight: 175 lb (79.379 kg)   Body mass index is 31.01 kg/(m^2). Ideal Body Weight: Weight in (lb) to have BMI = 25: 140.8  GEN: WDWN, NAD, Non-toxic, A & O x 3 HEENT: Atraumatic, Normocephalic. Ears and  Nose: No external deformity. CV: RRR, No M/G/R. No JVD. No thrill. No extra heart sounds. PULM: CTAB, no wheezes, crackles, rhonchi. No retractions. No resp. distress. No accessory muscle use. EXTR: No c/c/e, pain superior lateral right hip on exam with worsened pain with fadir and faber testing, no pain over the SI joint on the left and no pain over the trochanteric bursa, good passive ROM with mild pain with external rotation NEURO limping, no sensory change  PSYCH: Normally interactive. Conversant. Not depressed or anxious appearing.  Calm demeanor.    Assessment and Plan: Posterior pain of hip, left - Plan: meloxicam (MOBIC) 15 MG tablet   Reviewed imaging from previous urgent care visit and agree that there is no significant bony abnormality Exam not consistent with trochanteric bursitis as pain is more superior, and not consistent with SI joint pain as the pain is more lateral Likely muscular pain 2/2 overuse but with co morbidities will refer to ortho for further evaluation Start meloxicam 15 mg daily, continue rest ice and elevation Remove from work x 1 week  Signed, Romeo Apple Adams-Doolittle, DO I have completed the patient encounter in its entirety as documented by the Resident Physician, with editing by me where necessary. Robert P. Merla Riches, M.D.

## 2014-10-11 NOTE — Patient Instructions (Signed)
Taking out of work for 1 week Start meloxicam 15 mg daily Refer to orthopedics Rest ice elevation at home

## 2014-10-17 ENCOUNTER — Ambulatory Visit (INDEPENDENT_AMBULATORY_CARE_PROVIDER_SITE_OTHER): Payer: Managed Care, Other (non HMO) | Admitting: Emergency Medicine

## 2014-10-17 VITALS — BP 150/104 | HR 77 | Temp 98.2°F | Resp 16 | Ht 63.0 in | Wt 174.0 lb

## 2014-10-17 DIAGNOSIS — M25552 Pain in left hip: Secondary | ICD-10-CM | POA: Diagnosis not present

## 2014-10-17 MED ORDER — NAPROXEN SODIUM 550 MG PO TABS: 550.0000 mg | ORAL_TABLET | Freq: Two times a day (BID) | ORAL | Status: AC

## 2014-10-17 NOTE — Patient Instructions (Signed)
Hip Pain Your hip is the joint between your upper legs and your lower pelvis. The bones, cartilage, tendons, and muscles of your hip joint perform a lot of work each day supporting your body weight and allowing you to move around. Hip pain can range from a minor ache to severe pain in one or both of your hips. Pain may be felt on the inside of the hip joint near the groin, or the outside near the buttocks and upper thigh. You may have swelling or stiffness as well.  HOME CARE INSTRUCTIONS   Take medicines only as directed by your health care provider.  Apply ice to the injured area:  Put ice in a plastic bag.  Place a towel between your skin and the bag.  Leave the ice on for 15-20 minutes at a time, 3-4 times a day.  Keep your leg raised (elevated) when possible to lessen swelling.  Avoid activities that cause pain.  Follow specific exercises as directed by your health care provider.  Sleep with a pillow between your legs on your most comfortable side.  Record how often you have hip pain, the location of the pain, and what it feels like. SEEK MEDICAL CARE IF:   You are unable to put weight on your leg.  Your hip is red or swollen or very tender to touch.  Your pain or swelling continues or worsens after 1 week.  You have increasing difficulty walking.  You have a fever. SEEK IMMEDIATE MEDICAL CARE IF:   You have fallen.  You have a sudden increase in pain and swelling in your hip. MAKE SURE YOU:   Understand these instructions.  Will watch your condition.  Will get help right away if you are not doing well or get worse. Document Released: 08/01/2009 Document Revised: 06/28/2013 Document Reviewed: 10/08/2012 [employer] Patient Information 2015 [employer]. This information is not intended to replace advice given to you by your health care provider. Make sure you discuss any questions you have with your health care provider.  

## 2014-10-17 NOTE — Progress Notes (Signed)
Subjective:  Patient ID: Laura [name], female    DOB: 1965/06/17  Age: 49 y.o. MRN: [id number]  CC: Hip Pain   HPI Laura [name] presents  with left posterior hip pain since 10/08/2014 at which time she was seen in [facility]. She had a radiographic accomplished at that time it which was read as negative by the radiologist. She was told to take Motrin. She again was seen here on the 16th by Dr. Merla Riches who reviewed the radiographs and suggested she try mobic. 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 She said she's having difficulty keeping to her time schedule because of pain in her need to frequently rest. She is expecting an appointment with orthopedic surgeon. She has no history of injury or overuse.  History Laura [name] has a past medical history of Mononeuritis multiplex; Unspecified hereditary and idiopathic peripheral neuropathy; Hearing loss; Rash; Joint pain; Ischemic bowel disease; Cyst; Wegener's granulomatosis; Nasal bleeding; Hypertension; Hypothyroidism; Anxiety; Chronic headache; Chronic abdominal pain; and Chronic foot pain.   She has past surgical history that includes Tubal ligation (1992).   Her  family history includes Breast cancer in her mother; Diabetes in her mother and sister; Hypertension in her mother and sister.  She   reports that she quit smoking about 7 years ago. She has never used smokeless tobacco. She reports that she does not drink alcohol or use illicit drugs.  Outpatient Prescriptions Prior to Visit  Medication Sig Dispense Refill  . azaTHIOprine (IMURAN) 50 MG tablet Take 100 mg by mouth 2 (two) times daily.    Marland Kitchen estradiol (VIVELLE-DOT) 0.05 MG/24HR patch Place 1 patch (0.05 mg total) onto the skin 2 (two) times a week. 8 patch 12  . fentaNYL (DURAGESIC - DOSED MCG/HR) 100 MCG/HR Place 100 mcg onto the skin every 3 (three) days.    Marland Kitchen lisinopril  (PRINIVIL,ZESTRIL) 20 MG tablet Take 20 mg by mouth daily.    . progesterone (PROMETRIUM) 200 MG capsule Take 1 capsule (200 mg total) by mouth daily. 12 capsule 12  . promethazine (PHENERGAN) 25 MG tablet Take 1 tablet (25 mg total) by mouth every 6 (six) hours as needed for nausea or vomiting (or headache). 8 tablet 0  . sulfamethoxazole-trimethoprim (BACTRIM DS) 800-160 MG per tablet Take 1 tablet by mouth daily.     . Tapentadol HCl (NUCYNTA) 75 MG TABS Take 75 mg by mouth every 6 (six) hours as needed (pain).    . meloxicam (MOBIC) 15 MG tablet Take 1 tablet (15 mg total) by mouth daily. 30 tablet 0  . zolpidem (AMBIEN) 10 MG tablet Take 10 mg by mouth at bedtime as needed. For sleep     No facility-administered medications prior to visit.    Social History   Social History  . Marital Status: Single    Spouse Name: N/A  . Number of Children: N/A  . Years of Education: N/A   Social History Main Topics  . Smoking status: Former Smoker    Quit date: 05/06/2007  . Smokeless tobacco: Never Used  . Alcohol Use: No  . Drug Use: No  . Sexual Activity: No   Other Topics Concern  . None   Social History Narrative     Review of Systems  Constitutional: Negative for fever, chills and appetite change.  HENT: Negative for congestion, ear pain, postnasal drip, sinus pressure and sore throat.   Eyes:  Negative for pain and redness.  Respiratory: Negative for cough, shortness of breath and wheezing.   Cardiovascular: Negative for leg swelling.  Gastrointestinal: Negative for nausea, vomiting, abdominal pain, diarrhea, constipation and blood in stool.  Endocrine: Negative for polyuria.  Genitourinary: Negative for dysuria, urgency, frequency and flank pain.  Musculoskeletal: Negative for gait problem.  Skin: Negative for rash.  Neurological: Negative for weakness and headaches.  Psychiatric/Behavioral: Negative for confusion and decreased concentration. The patient is not  nervous/anxious.     Objective:  BP 150/104 mmHg  Pulse 77  Temp(Src) 98.2 F (36.8 C) (Oral)  Resp 16  Ht  (1.6 m)  Wt 174 lb (78.926 kg)  BMI 30.83 kg/m2  SpO2 98%  Physical Exam  Constitutional: She is oriented to person, place, and time. She appears well-developed and well-nourished.  HENT:  Head: Normocephalic and atraumatic.  Eyes: Conjunctivae are normal. Pupils are equal, round, and reactive to light.  Pulmonary/Chest: Effort normal.  Musculoskeletal: She exhibits no edema.       Left hip: She exhibits tenderness.  Neurological: She is alert and oriented to person, place, and time.  Skin: Skin is dry.  Psychiatric: She has a normal mood and affect. Her behavior is normal. Thought content normal.   She has tenderness posterior to the greater trochanter. She has normal passive range of motion.   Assessment & Plan:   Laura [name] was seen today for hip pain.  Diagnoses and all orders for this visit:  Posterior pain of hip, left -     Ambulatory referral to Orthopedic Surgery  Other orders -     naproxen sodium (ANAPROX DS) 550 MG tablet; Take 1 tablet (550 mg total) by mouth 2 (two) times daily with a meal.   I have discontinued Ms. Laura [name]'s meloxicam. I am also having her start on naproxen sodium. Additionally, I am having her maintain her lisinopril, zolpidem, azaTHIOprine, fentaNYL, sulfamethoxazole-trimethoprim, Tapentadol HCl, promethazine, estradiol, and progesterone.  Meds ordered this encounter  Medications  . naproxen sodium (ANAPROX DS) 550 MG tablet    Sig: Take 1 tablet (550 mg total) by mouth 2 (two) times daily with a meal.    Dispense:  40 tablet    Refill:  0    Appropriate red flag conditions were discussed with the patient as well as actions that should be taken.  Patient expressed his understanding.  Follow-up: Return if symptoms worsen or fail to improve.  Carmelina Dane, MD

## 2015-02-15 ENCOUNTER — Encounter: Payer: Managed Care, Other (non HMO) | Admitting: Women's Health

## 2015-03-17 ENCOUNTER — Other Ambulatory Visit: Payer: Self-pay | Admitting: Women's Health

## 2015-04-13 ENCOUNTER — Encounter (HOSPITAL_COMMUNITY): Payer: Self-pay

## 2015-04-13 ENCOUNTER — Emergency Department (HOSPITAL_COMMUNITY)
Admission: EM | Admit: 2015-04-13 | Discharge: 2015-04-13 | Disposition: A | Discharge status: Home / Self Care | Payer: Managed Care, Other (non HMO) | Attending: Emergency Medicine | Admitting: Emergency Medicine

## 2015-04-13 DIAGNOSIS — Z87891 Personal history of nicotine dependence: Secondary | ICD-10-CM | POA: Insufficient documentation

## 2015-04-13 DIAGNOSIS — Z872 Personal history of diseases of the skin and subcutaneous tissue: Secondary | ICD-10-CM | POA: Diagnosis not present

## 2015-04-13 DIAGNOSIS — M3130 Wegener's granulomatosis without renal involvement: Secondary | ICD-10-CM | POA: Diagnosis not present

## 2015-04-13 DIAGNOSIS — Z8719 Personal history of other diseases of the digestive system: Secondary | ICD-10-CM | POA: Diagnosis not present

## 2015-04-13 DIAGNOSIS — I10 Essential (primary) hypertension: Secondary | ICD-10-CM | POA: Diagnosis not present

## 2015-04-13 DIAGNOSIS — H53149 Visual discomfort, unspecified: Secondary | ICD-10-CM | POA: Diagnosis not present

## 2015-04-13 DIAGNOSIS — Z8639 Personal history of other endocrine, nutritional and metabolic disease: Secondary | ICD-10-CM | POA: Insufficient documentation

## 2015-04-13 DIAGNOSIS — Z791 Long term (current) use of non-steroidal anti-inflammatories (NSAID): Secondary | ICD-10-CM | POA: Insufficient documentation

## 2015-04-13 DIAGNOSIS — Z79818 Long term (current) use of other agents affecting estrogen receptors and estrogen levels: Secondary | ICD-10-CM | POA: Insufficient documentation

## 2015-04-13 DIAGNOSIS — R110 Nausea: Secondary | ICD-10-CM | POA: Diagnosis not present

## 2015-04-13 DIAGNOSIS — H9190 Unspecified hearing loss, unspecified ear: Secondary | ICD-10-CM | POA: Diagnosis not present

## 2015-04-13 DIAGNOSIS — R51 Headache: Secondary | ICD-10-CM | POA: Diagnosis not present

## 2015-04-13 DIAGNOSIS — Z8659 Personal history of other mental and behavioral disorders: Secondary | ICD-10-CM | POA: Insufficient documentation

## 2015-04-13 DIAGNOSIS — G8929 Other chronic pain: Secondary | ICD-10-CM | POA: Diagnosis not present

## 2015-04-13 DIAGNOSIS — Z79899 Other long term (current) drug therapy: Secondary | ICD-10-CM | POA: Diagnosis not present

## 2015-04-13 DIAGNOSIS — Z792 Long term (current) use of antibiotics: Secondary | ICD-10-CM | POA: Diagnosis not present

## 2015-04-13 DIAGNOSIS — R519 Headache, unspecified: Secondary | ICD-10-CM

## 2015-04-13 MED ORDER — METOCLOPRAMIDE HCL 5 MG/ML IJ SOLN
10.0000 mg | Freq: Once | INTRAMUSCULAR | Status: AC
  Administered 2015-04-13: 10 mg via INTRAVENOUS
  Filled 2015-04-13: qty 2

## 2015-04-13 MED ORDER — SODIUM CHLORIDE 0.9 % IV BOLUS (SEPSIS)
1000.0000 mL | Freq: Once | INTRAVENOUS | Status: AC
  Administered 2015-04-13: 1000 mL via INTRAVENOUS

## 2015-04-13 MED ORDER — DIPHENHYDRAMINE HCL 50 MG/ML IJ SOLN
25.0000 mg | Freq: Once | INTRAMUSCULAR | Status: AC
  Administered 2015-04-13: 25 mg via INTRAVENOUS
  Filled 2015-04-13: qty 1

## 2015-04-13 MED ORDER — KETOROLAC TROMETHAMINE 30 MG/ML IJ SOLN
30.0000 mg | Freq: Once | INTRAMUSCULAR | Status: AC
  Administered 2015-04-13: 30 mg via INTRAVENOUS
  Filled 2015-04-13: qty 1

## 2015-04-13 NOTE — Discharge Instructions (Signed)
Ms. Laura [name],  Nice meeting you! Please follow-up with your family doctor. Return to the emergency department if you have worsening headache, visual changes, slurred speech, weakness in your arms or legs, facial droop. Feel better soon!  S. Lane Hacker, PA-C

## 2015-04-13 NOTE — ED Provider Notes (Signed)
CSN: [id number]     Arrival date & time 04/13/15  1021 History   First MD Initiated Contact with Patient 04/13/15 1541     Chief Complaint  Patient presents with  . Headache   HPI   Laura [name] is a 50 y.o. female PMH significant for mononeuritis multiplex, anxiety, chronic headache, hypertension presenting with a 3 day history of headache. She describes her headache as frontal in location, nonradiating, 7 out of 10 pain scale, intermittent, discomfort. She endorses a history of migraines in the past but states her migraines have not occurred in 3 years and that this is "a little" different. She endorses nausea and photophobia. She denies fevers, chills, neck stiffness, emesis, changes in bowel or bladder habits, chest pain, shortness of breath, cough, upper respiratory symptoms.  Past Medical History  Diagnosis Date  . Mononeuritis multiplex   . Unspecified hereditary and idiopathic peripheral neuropathy   . Hearing loss   . Rash   . Joint pain   . Ischemic bowel disease (HCC)   . Cyst   . Wegener's granulomatosis (HCC)   . Nasal bleeding   . Hypertension   . Hypothyroidism   . Anxiety     generalized  . Chronic headache   . Chronic abdominal pain   . Chronic foot pain    Past Surgical History  Procedure Laterality Date  . Tubal ligation  1992   Family History  Problem Relation Age of Onset  . Diabetes Mother   . Hypertension Mother   . Breast cancer Mother   . Diabetes Sister   . Hypertension Sister    Social History  Substance Use Topics  . Smoking status: Former Smoker    Quit date: 05/06/2007  . Smokeless tobacco: Never Used  . Alcohol Use: No   OB History    No data available     Review of Systems  Ten systems are reviewed and are negative for acute change except as noted in the HPI  Allergies  Review of patient's allergies indicates no known allergies.  Home Medications   Prior to Admission medications   Medication Sig Start Date End Date  Taking? Authorizing Provider  azaTHIOprine (IMURAN) 50 MG tablet Take 100 mg by mouth 2 (two) times daily.    Historical Provider, MD  estradiol (VIVELLE-DOT) 0.05 MG/24HR patch apply 1 patch two times a week as directed 03/17/15   Harrington Challenger, NP  fentaNYL (DURAGESIC - DOSED MCG/HR) 100 MCG/HR Place 100 mcg onto the skin every 3 (three) days.    Historical Provider, MD  lisinopril (PRINIVIL,ZESTRIL) 20 MG tablet Take 20 mg by mouth daily.    Historical Provider, MD  naproxen sodium (ANAPROX DS) 550 MG tablet Take 1 tablet (550 mg total) by mouth 2 (two) times daily with a meal. 10/17/14 10/17/15  Carmelina Dane, MD  progesterone (PROMETRIUM) 200 MG capsule Take 1 capsule (200 mg total) by mouth daily. 02/10/14   Harrington Challenger, NP  promethazine (PHENERGAN) 25 MG tablet Take 1 tablet (25 mg total) by mouth every 6 (six) hours as needed for nausea or vomiting (or headache). 10/09/13   Samuel Jester, DO  sulfamethoxazole-trimethoprim (BACTRIM DS) 800-160 MG per tablet Take 1 tablet by mouth daily.  05/24/13   Historical Provider, MD  Tapentadol HCl (NUCYNTA) 75 MG TABS Take 75 mg by mouth every 6 (six) hours as needed (pain).    Historical Provider, MD  zolpidem (AMBIEN) 10 MG tablet Take 10 mg by  mouth at bedtime as needed. For sleep    Historical Provider, MD   BP 139/92 mmHg  Pulse 72  Temp(Src) 98.5 F (36.9 C) (Oral)  Resp 20  SpO2 98% Physical Exam  Constitutional: She is oriented to person, place, and time. She appears well-developed and well-nourished. No distress.  HENT:  Head: Normocephalic and atraumatic.  Right Ear: External ear normal.  Left Ear: External ear normal.  Nose: Nose normal.  Mouth/Throat: Oropharynx is clear and moist. No oropharyngeal exudate.  BL TMs without bulging, erythema, drainage.   Eyes: Conjunctivae and EOM are normal. Pupils are equal, round, and reactive to light. Right eye exhibits no discharge. Left eye exhibits no discharge. No scleral icterus.   Neck: Normal range of motion. Neck supple. No tracheal deviation present.  Cardiovascular: Normal rate, regular rhythm, normal heart sounds and intact distal pulses.  Exam reveals no gallop and no friction rub.   No murmur heard. Pulmonary/Chest: Effort normal and breath sounds normal. No respiratory distress. She has no wheezes. She has no rales. She exhibits no tenderness.  Abdominal: She exhibits no distension. There is no tenderness. There is no rebound and no guarding.  Musculoskeletal: She exhibits no edema.  Lymphadenopathy:    She has no cervical adenopathy.  Neurological: She is alert and oriented to person, place, and time. No cranial nerve deficit. Coordination normal.  Normal neuro including finger to nose, RAM, gait.   Skin: Skin is warm and dry. No rash noted. She is not diaphoretic. No erythema.  Psychiatric: She has a normal mood and affect. Her behavior is normal.  Nursing note and vitals reviewed.   ED Course  Procedures  MDM   Final diagnoses:  Headache, unspecified headache type   Patient non-toxic appearing and VSS.   Medications  sodium chloride 0.9 % bolus 1,000 mL (not administered)  metoCLOPramide (REGLAN) injection 10 mg (not administered)  diphenhydrAMINE (BENADRYL) injection 25 mg (not administered)  ketorolac (TORADOL) 30 MG/ML injection 30 mg (not administered)   Pt HA treated and improved while in ED.  Presentation is like pts typical HA and non concerning for [facility], ICH, Meningitis, or temporal arteritis. Pt is afebrile with no focal neuro deficits, nuchal rigidity, or change in vision.  Patient may be safely discharged home. Discussed reasons for return. Patient to follow-up with primary care provider within one week. Patient in understanding and agreement with the plan.    Chinenye Katzenberger, PA-C 04/13/15 1724  Linwood Dibbles, MD 04/13/15 [phone]

## 2015-04-13 NOTE — ED Notes (Signed)
Pt with headache x 3 days.  Pt states migraines in past but a while back.  Pt has had nausea with headache.  No nasal congestion.

## 2015-05-16 ENCOUNTER — Other Ambulatory Visit: Payer: Self-pay

## 2015-05-16 DIAGNOSIS — Z1231 Encounter for screening mammogram for malignant neoplasm of breast: Secondary | ICD-10-CM

## 2015-05-30 ENCOUNTER — Ambulatory Visit
Admission: RE | Admit: 2015-05-30 | Discharge: 2015-05-30 | Disposition: A | Discharge status: Home / Self Care | Payer: Managed Care, Other (non HMO) | Source: Ambulatory Visit

## 2015-05-30 DIAGNOSIS — Z1231 Encounter for screening mammogram for malignant neoplasm of breast: Secondary | ICD-10-CM

## 2015-06-15 ENCOUNTER — Ambulatory Visit (INDEPENDENT_AMBULATORY_CARE_PROVIDER_SITE_OTHER): Payer: Managed Care, Other (non HMO) | Admitting: Women's Health

## 2015-06-15 ENCOUNTER — Encounter: Payer: Self-pay | Admitting: Women's Health

## 2015-06-15 VITALS — BP 130/82 | Ht 63.0 in | Wt 175.0 lb

## 2015-06-15 DIAGNOSIS — Z01419 Encounter for gynecological examination (general) (routine) without abnormal findings: Secondary | ICD-10-CM

## 2015-06-15 DIAGNOSIS — Z79890 Hormone replacement therapy (postmenopausal): Secondary | ICD-10-CM | POA: Diagnosis not present

## 2015-06-15 MED ORDER — ESTRADIOL 0.05 MG/24HR TD PTWK: 0.0500 mg | MEDICATED_PATCH | TRANSDERMAL | Status: DC

## 2015-06-15 MED ORDER — PROGESTERONE MICRONIZED 200 MG PO CAPS: ORAL_CAPSULE | ORAL | Status: DC

## 2015-06-15 NOTE — Progress Notes (Signed)
Laura [name] August 02, 1965 [id number]    History:    Presents for annual exam.  Postmenopausal on HRT with good symptom relief. Currently on Vivelle patch and Prometrium 200 at bedtime day one through 12. Normal Pap and mammogram history. Not sexually active. Has not had a DEXA. History of a BTL. Hypertension and depression managed by primary care. 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  2009 colonoscopy negative for polyps.   Past medical history, past surgical history, family history and social history were all reviewed and documented in the EPIC chart. 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 2 children both doing well.  ROS:  A ROS was performed and pertinent positives and negatives are included.  Exam:  Filed Vitals:   06/15/15 1402  BP: 130/82    General appearance:  Normal Thyroid:  Symmetrical, normal in size, without palpable masses or nodularity. Respiratory  Auscultation:  Clear without wheezing or rhonchi Cardiovascular  Auscultation:  Regular rate, without rubs, murmurs or gallops  Edema/varicosities:  Not grossly evident Abdominal  Soft,nontender, without masses, guarding or rebound.  Liver/spleen:  No organomegaly noted  Hernia:  None appreciated  Skin  Inspection:  Grossly normal   Breasts: Examined lying and sitting.     Right: Without masses, retractions, discharge or axillary adenopathy.     Left: Without masses, retractions, discharge or axillary adenopathy. Gentitourinary   Inguinal/mons:  Normal without inguinal adenopathy  External genitalia:  Normal  BUS/Urethra/Skene's glands:  Normal  Vagina:  Normal  Cervix:  Normal  Uterus: normal in size, shape and contour.  Midline and mobile  Adnexa/parametria:     Rt: Without masses or tenderness.   Lt: Without masses or tenderness.  Anus and perineum: Normal  Digital rectal exam: Normal sphincter tone without palpated masses or tenderness  Assessment/Plan:  50 y.o. SBF  G2 P2 for annual exam.  Postmenopausal on HRT with no bleeding with good symptom relief Hypertension, depression managed by primary care Wegener's disease-chronic pain -neurologist.  Plan: HRT reviewed risks of blood clots, strokes and breast cancer, would like to continue, estradiol 0.05 patch weekly prescription, proper use given and reviewed, Prometrium 200 day one through 12 at at bedtime, prescription, proper use given and reviewed importance of taking both. SBE's, continue annual screening mammogram, calcium rich diet, vitamin D 2000 daily encouraged. Schedule DEXA. Pap normal with negative HR HPV typing 01/2014, new screening guidelines reviewed. Declines need for STD screening, condoms encouraged if sexually active.   Harrington ChallengerYOUNG,NANCY J Pennsylvania HospitalWHNP, 2:36 PM 06/15/2015

## 2015-06-15 NOTE — Patient Instructions (Signed)
Menopause is a normal process in which your reproductive ability comes to an end. This process happens gradually over a span of months to years, usually between the ages of 48 and 55. Menopause is complete when you have missed 12 consecutive menstrual periods. It is important to talk with your health care provider about some of the most common conditions that affect postmenopausal women, such as heart disease, cancer, and bone loss (osteoporosis). Adopting a healthy lifestyle and getting preventive care can help to promote your health and wellness. Those actions can also lower your chances of developing some of these common conditions. WHAT SHOULD I KNOW ABOUT MENOPAUSE? During menopause, you may experience a number of symptoms, such as:  Moderate-to-severe hot flashes.  Night sweats.  Decrease in sex drive.  Mood swings.  Headaches.  Tiredness.  Irritability.  Memory problems.  Insomnia. Choosing to treat or not to treat menopausal changes is an individual decision that you make with your health care provider. WHAT SHOULD I KNOW ABOUT HORMONE REPLACEMENT THERAPY AND SUPPLEMENTS? Hormone therapy products are effective for treating symptoms that are associated with menopause, such as hot flashes and night sweats. Hormone replacement carries certain risks, especially as you become older. If you are thinking about using estrogen or estrogen with progestin treatments, discuss the benefits and risks with your health care provider. WHAT SHOULD I KNOW ABOUT HEART DISEASE AND STROKE? Heart disease, heart attack, and stroke become more likely as you age. This may be due, in part, to the hormonal changes that your body experiences during menopause. These can affect how your body processes dietary fats, triglycerides, and cholesterol. Heart attack and stroke are both medical emergencies. There are many things that you can do to help prevent heart disease and stroke:  Have your blood pressure  checked at least every 1-2 years. High blood pressure causes heart disease and increases the risk of stroke.  If you are 55-79 years old, ask your health care provider if you should take aspirin to prevent a heart attack or a stroke.  Do not use any tobacco products, including cigarettes, chewing tobacco, or electronic cigarettes. If you need help quitting, ask your health care provider.  It is important to eat a healthy diet and maintain a healthy weight.  Be sure to include plenty of vegetables, fruits, low-fat dairy products, and lean protein.  Avoid eating foods that are high in solid fats, added sugars, or salt (sodium).  Get regular exercise. This is one of the most important things that you can do for your health.  Try to exercise for at least 150 minutes each week. The type of exercise that you do should increase your heart rate and make you sweat. This is known as moderate-intensity exercise.  Try to do strengthening exercises at least twice each week. Do these in addition to the moderate-intensity exercise.  Know your numbers.Ask your health care provider to check your cholesterol and your blood glucose. Continue to have your blood tested as directed by your health care provider. WHAT SHOULD I KNOW ABOUT CANCER SCREENING? There are several types of cancer. Take the following steps to reduce your risk and to catch any cancer development as early as possible. Breast Cancer  Practice breast self-awareness.  This means understanding how your breasts normally appear and feel.  It also means doing regular breast self-exams. Let your health care provider know about any changes, no matter how small.  If you are 40 or older, have a clinician do a   breast exam (clinical breast exam or CBE) every year. Depending on your age, family history, and medical history, it may be recommended that you also have a yearly breast X-ray (mammogram).  If you have a family history of breast cancer,  talk with your health care provider about genetic screening.  If you are at high risk for breast cancer, talk with your health care provider about having an MRI and a mammogram every year.  Breast cancer (BRCA) gene test is recommended for women who have family members with BRCA-related cancers. Results of the assessment will determine the need for genetic counseling and BRCA1 and for BRCA2 testing. BRCA-related cancers include these types:  Breast. This occurs in males or females.  Ovarian.  Tubal. This may also be called fallopian tube cancer.  Cancer of the abdominal or pelvic lining (peritoneal cancer).  Prostate.  Pancreatic. Cervical, Uterine, and Ovarian Cancer Your health care provider may recommend that you be screened regularly for cancer of the pelvic organs. These include your ovaries, uterus, and vagina. This screening involves a pelvic exam, which includes checking for microscopic changes to the surface of your cervix (Pap test).  For women ages 21-65, health care providers may recommend a pelvic exam and a Pap test every three years. For women ages 77-65, they may recommend the Pap test and pelvic exam, combined with testing for human papilloma virus (HPV), every five years. Some types of HPV increase your risk of cervical cancer. Testing for HPV may also be done on women of any age who have unclear Pap test results.  Other health care providers may not recommend any screening for nonpregnant women who are considered low risk for pelvic cancer and have no symptoms. Ask your health care provider if a screening pelvic exam is right for you.  If you have had past treatment for cervical cancer or a condition that could lead to cancer, you need Pap tests and screening for cancer for at least 20 years after your treatment. If Pap tests have been discontinued for you, your risk factors (such as having a new sexual partner) need to be reassessed to determine if you should start having  screenings again. Some women have medical problems that increase the chance of getting cervical cancer. In these cases, your health care provider may recommend that you have screening and Pap tests more often.  If you have a family history of uterine cancer or ovarian cancer, talk with your health care provider about genetic screening.  If you have vaginal bleeding after reaching menopause, tell your health care provider.  There are currently no reliable tests available to screen for ovarian cancer. Lung Cancer Lung cancer screening is recommended for adults 3-70 years old who are at high risk for lung cancer because of a history of smoking. A yearly low-dose CT scan of the lungs is recommended if you:  Currently smoke.  Have a history of at least 30 pack-years of smoking and you currently smoke or have quit within the past 15 years. A pack-year is smoking an average of one pack of cigarettes per day for one year. Yearly screening should:  Continue until it has been 15 years since you quit.  Stop if you develop a health problem that would prevent you from having lung cancer treatment. Colorectal Cancer  This type of cancer can be detected and can often be prevented.  Routine colorectal cancer screening usually begins at age 38 and continues through age 12.  If you have  risk factors for colon cancer, your health care provider may recommend that you be screened at an earlier age.  If you have a family history of colorectal cancer, talk with your health care provider about genetic screening.  Your health care provider may also recommend using home test kits to check for hidden blood in your stool.  A small camera at the end of a tube can be used to examine your colon directly (sigmoidoscopy or colonoscopy). This is done to check for the earliest forms of colorectal cancer.  Direct examination of the colon should be repeated every 5-10 years until age 67. However, if early forms of  precancerous polyps or small growths are found or if you have a family history or genetic risk for colorectal cancer, you may need to be screened more often. Skin Cancer  Check your skin from head to toe regularly.  Monitor any moles. Be sure to tell your health care provider:  About any new moles or changes in moles, especially if there is a change in a mole's shape or color.  If you have a mole that is larger than the size of a pencil eraser.  If any of your family members has a history of skin cancer, especially at a Maram Bently age, talk with your health care provider about genetic screening.  Always use sunscreen. Apply sunscreen liberally and repeatedly throughout the day.  Whenever you are outside, protect yourself by wearing long sleeves, pants, a wide-brimmed hat, and sunglasses. WHAT SHOULD I KNOW ABOUT OSTEOPOROSIS? Osteoporosis is a condition in which bone destruction happens more quickly than new bone creation. After menopause, you may be at an increased risk for osteoporosis. To help prevent osteoporosis or the bone fractures that can happen because of osteoporosis, the following is recommended:  If you are 39-61 years old, get at least 1,000 mg of calcium and at least 600 mg of vitamin D per day.  If you are older than age 16 but younger than age 7, get at least 1,200 mg of calcium and at least 600 mg of vitamin D per day.  If you are older than age 47, get at least 1,200 mg of calcium and at least 800 mg of vitamin D per day. Smoking and excessive alcohol intake increase the risk of osteoporosis. Eat foods that are rich in calcium and vitamin D, and do weight-bearing exercises several times each week as directed by your health care provider. WHAT SHOULD I KNOW ABOUT HOW MENOPAUSE AFFECTS [place]? Depression may occur at any age, but it is more common as you become older. Common symptoms of depression include:  Low or sad mood.  Changes in sleep patterns.  Changes  in appetite or eating patterns.  Feeling an overall lack of motivation or enjoyment of activities that you previously enjoyed.  Frequent crying spells. Talk with your health care provider if you think that you are experiencing depression. WHAT SHOULD I KNOW ABOUT IMMUNIZATIONS? It is important that you get and maintain your immunizations. These include:  Tetanus, diphtheria, and pertussis (Tdap) booster vaccine.  Influenza every year before the flu season begins.  Pneumonia vaccine.  Shingles vaccine. Your health care provider may also recommend other immunizations.   This information is not intended to replace advice given to you by your health care provider. Make sure you discuss any questions you have with your health care provider.   Document Released: 04/05/2005 Document Revised: 03/04/2014 Document Reviewed: 10/14/2013 Elsevier Interactive Patient Education 2016 Elsevier  Inc. DASH Eating Plan DASH stands for "Dietary Approaches to Stop Hypertension." The DASH eating plan is a healthy eating plan that has been shown to reduce high blood pressure (hypertension). Additional health benefits may include reducing the risk of type 2 diabetes mellitus, heart disease, and stroke. The DASH eating plan may also help with weight loss. WHAT DO I NEED TO KNOW ABOUT THE DASH EATING PLAN? For the DASH eating plan, you will follow these general guidelines:  Choose foods with a percent daily value for sodium of less than 5% (as listed on the food label).  Use salt-free seasonings or herbs instead of table salt or sea salt.  Check with your health care provider or pharmacist before using salt substitutes.  Eat lower-sodium products, often labeled as "lower sodium" or "no salt added."  Eat fresh foods.  Eat more vegetables, fruits, and low-fat dairy products.  Choose whole grains. Look for the word "whole" as the first word in the ingredient list.  Choose fish and skinless chicken or  [place] more often than red meat. Limit fish, poultry, and meat to 6 oz (170 g) each day.  Limit sweets, desserts, sugars, and sugary drinks.  Choose heart-healthy fats.  Limit cheese to 1 oz (28 g) per day.  Eat more home-cooked food and less restaurant, buffet, and fast food.  Limit fried foods.  Cook foods using methods other than frying.  Limit canned vegetables. If you do use them, rinse them well to decrease the sodium.  When eating at a restaurant, ask that your food be prepared with less salt, or no salt if possible. WHAT FOODS CAN I EAT? Seek help from a dietitian for individual calorie needs. Grains Whole grain or whole wheat bread. Brown rice. Whole grain or whole wheat pasta. Quinoa, bulgur, and whole grain cereals. Low-sodium cereals. Corn or whole wheat flour tortillas. Whole grain cornbread. Whole grain crackers. Low-sodium crackers. Vegetables Fresh or frozen vegetables (raw, steamed, roasted, or grilled). Low-sodium or reduced-sodium tomato and vegetable juices. Low-sodium or reduced-sodium tomato sauce and paste. Low-sodium or reduced-sodium canned vegetables.  Fruits All fresh, canned (in natural juice), or frozen fruits. Meat and Other Protein Products Ground beef (85% or leaner), grass-fed beef, or beef trimmed of fat. Skinless chicken or [place]. Ground chicken or [place]. Pork trimmed of fat. All fish and seafood. Eggs. Dried beans, peas, or lentils. Unsalted nuts and seeds. Unsalted canned beans. Dairy Low-fat dairy products, such as skim or 1% milk, 2% or reduced-fat cheeses, low-fat ricotta or cottage cheese, or plain low-fat yogurt. Low-sodium or reduced-sodium cheeses. Fats and Oils Tub margarines without trans fats. Light or reduced-fat mayonnaise and salad dressings (reduced sodium). Avocado. Safflower, olive, or canola oils. Natural peanut or almond butter. Other Unsalted popcorn and pretzels. The items listed above may not be a complete list of  recommended foods or beverages. Contact your dietitian for more options. WHAT FOODS ARE NOT RECOMMENDED? Grains White bread. White pasta. White rice. Refined cornbread. Bagels and croissants. Crackers that contain trans fat. Vegetables Creamed or fried vegetables. Vegetables in a cheese sauce. Regular canned vegetables. Regular canned tomato sauce and paste. Regular tomato and vegetable juices. Fruits Dried fruits. Canned fruit in light or heavy syrup. Fruit juice. Meat and Other Protein Products Fatty cuts of meat. Ribs, chicken wings, bacon, sausage, bologna, salami, chitterlings, fatback, hot dogs, bratwurst, and packaged luncheon meats. Salted nuts and seeds. Canned beans with salt. Dairy Whole or 2% milk, cream, half-and-half, and cream cheese. Whole-fat or sweetened  yogurt. Full-fat cheeses or blue cheese. Nondairy creamers and whipped toppings. Processed cheese, cheese spreads, or cheese curds. Condiments Onion and garlic salt, seasoned salt, table salt, and sea salt. Canned and packaged gravies. Worcestershire sauce. Tartar sauce. Barbecue sauce. Teriyaki sauce. Soy sauce, including reduced sodium. Steak sauce. Fish sauce. Oyster sauce. Cocktail sauce. Horseradish. Ketchup and mustard. Meat flavorings and tenderizers. Bouillon cubes. Hot sauce. Tabasco sauce. Marinades. Taco seasonings. Relishes. Fats and Oils Butter, stick margarine, lard, shortening, ghee, and bacon fat. Coconut, palm kernel, or palm oils. Regular salad dressings. Other Pickles and olives. Salted popcorn and pretzels. The items listed above may not be a complete list of foods and beverages to avoid. Contact your dietitian for more information. WHERE CAN I FIND MORE INFORMATION? National Heart, Lung, and Blood Institute: travelstabloid.com   This information is not intended to replace advice given to you by your health care provider. Make sure you discuss any questions you have with  your health care provider.   Document Released: 01/31/2011 Document Revised: 03/04/2014 Document Reviewed: 12/16/2012 Elsevier Interactive Patient Education [employer].

## 2015-06-16 LAB — URINALYSIS W MICROSCOPIC + REFLEX CULTURE
BACTERIA UA: NONE SEEN [HPF]
Bilirubin Urine: NEGATIVE
Casts: NONE SEEN [LPF]
GLUCOSE, UA: NEGATIVE
HGB URINE DIPSTICK: NEGATIVE
Ketones, ur: NEGATIVE
LEUKOCYTES UA: NEGATIVE
Nitrite: NEGATIVE
Specific Gravity, Urine: 1.037 — ABNORMAL HIGH (ref 1.001–1.035)
YEAST: NONE SEEN [HPF]
pH: 6 (ref 5.0–8.0)

## 2015-06-17 LAB — URINE CULTURE
COLONY COUNT: NO GROWTH
Organism ID, Bacteria: NO GROWTH

## 2015-08-11 ENCOUNTER — Telehealth: Payer: Self-pay | Admitting: *Deleted

## 2015-08-11 MED ORDER — ESTRADIOL 0.05 MG/24HR TD PTTW: 1.0000 | MEDICATED_PATCH | TRANSDERMAL | Status: DC

## 2015-08-11 NOTE — Telephone Encounter (Signed)
Pt aware Rx sent.  

## 2015-08-11 NOTE — Telephone Encounter (Signed)
Pt called to follow up with climara patches states no relief would like to switch back to vivelle-dot patch 0.05 mg? Please advise

## 2015-08-11 NOTE — Telephone Encounter (Signed)
Okay, please call in Vivelle-Dot 0.05 patch twice weekly

## 2015-08-17 ENCOUNTER — Ambulatory Visit (INDEPENDENT_AMBULATORY_CARE_PROVIDER_SITE_OTHER): Payer: Managed Care, Other (non HMO) | Admitting: Physician Assistant

## 2015-08-17 VITALS — BP 155/88 | HR 85 | Temp 98.3°F | Resp 16 | Ht 63.0 in | Wt 180.0 lb

## 2015-08-17 DIAGNOSIS — Z1329 Encounter for screening for other suspected endocrine disorder: Secondary | ICD-10-CM | POA: Diagnosis not present

## 2015-08-17 DIAGNOSIS — Z130 Encounter for screening for diseases of the blood and blood-forming organs and certain disorders involving the immune mechanism: Secondary | ICD-10-CM | POA: Diagnosis not present

## 2015-08-17 DIAGNOSIS — Z13228 Encounter for screening for other metabolic disorders: Secondary | ICD-10-CM | POA: Diagnosis not present

## 2015-08-17 DIAGNOSIS — R079 Chest pain, unspecified: Secondary | ICD-10-CM | POA: Diagnosis not present

## 2015-08-17 DIAGNOSIS — I10 Essential (primary) hypertension: Secondary | ICD-10-CM | POA: Diagnosis not present

## 2015-08-17 DIAGNOSIS — Z13220 Encounter for screening for lipoid disorders: Secondary | ICD-10-CM

## 2015-08-17 DIAGNOSIS — Z0000 Encounter for general adult medical examination without abnormal findings: Secondary | ICD-10-CM

## 2015-08-17 LAB — POCT CBC
GRANULOCYTE PERCENT: 59.3 % (ref 37–80)
HCT, POC: 33.3 % — AB (ref 37.7–47.9)
Hemoglobin: 11.2 g/dL — AB (ref 12.2–16.2)
LYMPH, POC: 3 (ref 0.6–3.4)
MCH, POC: 26.4 pg — AB (ref 27–31.2)
MCHC: 33.7 g/dL (ref 31.8–35.4)
MCV: 78.3 fL — AB (ref 80–97)
MID (CBC): 0.6 (ref 0–0.9)
MPV: 8.1 fL (ref 0–99.8)
PLATELET COUNT, POC: 181 10*3/uL (ref 142–424)
POC Granulocyte: 5.2 (ref 2–6.9)
POC LYMPH %: 34 % (ref 10–50)
POC MID %: 6.7 %M (ref 0–12)
RBC: 4.24 M/uL (ref 4.04–5.48)
RDW, POC: 16.1 %
WBC: 8.8 10*3/uL (ref 4.6–10.2)

## 2015-08-17 LAB — TROPONIN I

## 2015-08-17 LAB — COMPLETE METABOLIC PANEL WITH GFR
ALBUMIN: 4.5 g/dL (ref 3.6–5.1)
ALK PHOS: 82 U/L (ref 33–115)
ALT: 20 U/L (ref 6–29)
AST: 30 U/L (ref 10–35)
BILIRUBIN TOTAL: 0.4 mg/dL (ref 0.2–1.2)
BUN: 10 mg/dL (ref 7–25)
CO2: 26 mmol/L (ref 20–31)
CREATININE: 0.9 mg/dL (ref 0.50–1.10)
Calcium: 9.7 mg/dL (ref 8.6–10.2)
Chloride: 99 mmol/L (ref 98–110)
GFR, Est African American: 88 mL/min (ref >=60)
GFR, Est Non African American: 76 mL/min (ref >=60)
GLUCOSE: 72 mg/dL (ref 65–99)
Potassium: 4.5 mmol/L (ref 3.5–5.3)
SODIUM: 137 mmol/L (ref 135–146)
TOTAL PROTEIN: 7.6 g/dL (ref 6.1–8.1)

## 2015-08-17 LAB — TSH: TSH: 0.49 m[IU]/L

## 2015-08-17 LAB — POCT URINALYSIS DIP (MANUAL ENTRY)
Bilirubin, UA: NEGATIVE
GLUCOSE UA: NEGATIVE
Ketones, POC UA: NEGATIVE
LEUKOCYTES UA: NEGATIVE
NITRITE UA: NEGATIVE
Protein Ur, POC: 100 — AB
RBC UA: NEGATIVE
Spec Grav, UA: 1.025
UROBILINOGEN UA: 0.2
pH, UA: 6.5

## 2015-08-17 MED ORDER — LISINOPRIL 20 MG PO TABS: 20.0000 mg | ORAL_TABLET | Freq: Every day | ORAL | Status: DC

## 2015-08-17 NOTE — Patient Instructions (Addendum)
     IF you received an x-ray today, you will receive an invoice from [facility]. Please contact [facility] at [phone] with questions or concerns regarding your invoice.   IF you received labwork today, you will receive an invoice from [facility]. Please contact [facility] at [phone] with questions or concerns regarding your invoice.   Our billing staff will not be able to assist you with questions regarding bills from these companies.  You will be contacted with the lab results as soon as they are available. The fastest way to get your results is to activate your My Chart account. Instructions are located on the last page of this paperwork. If you have not heard from us regarding the results in 2 weeks, please contact this office.     Please restart the blood pressure medication at this time.  I am referring you to a cardiologist.  Please await phone contact. If your pain worsens, please return or go to the ED. I am ordering a stat troponin.  Please have your phone at hand, in case we call.  If we state that the troponin is positive, you must immediately go to the emergency department.

## 2015-08-17 NOTE — Progress Notes (Signed)
[facility] [address], [place] [address] [phone]  Date:  08/17/2015   Name:  Geanie Kenningicole R Weisberg   DOB:  12-31-1965   MRN:  [id number]  PCP:  Elby ShowersWALSH, CATHERINE, MD    History of Present Illness:  Geanie Kenningicole R Calabrese is a 50 y.o. female patient who [facility].    Chief Complaint  Patient presents with  . Chest Pain    started this am  . Asked during triage    Says she is not dizzy,no numbmess, no headache,no sob, no vomitting  . High blood pressure    Says she has not taken her BP medication in x 1 month   She wason blood pressure medication.  She She has not had bp medication, for 1 month.  Last week, rechecked at 180/??.  The other 170/96.   Chest pain left side that is very tight.  No dizziness, palpitation, numbness or tingling. Started this morning med tech--6:30am lasted 5 minutes.  Standing without exertion.  Localized pain.  Deep inspirations may aggravate the pain.  No hx of reflux.   No coughing.        Patient Active Problem List   Diagnosis Date Noted  . 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 Wegener's granulomatosis (HCC) 08/15/2011  . Hypertension   . Hypothyroidism   . Anxiety   . Mononeuritis lower limb 06/14/2011  . Chronic pain syndrome 06/14/2011    Past Medical History  Diagnosis Date  . Mononeuritis multiplex   . Unspecified hereditary and idiopathic peripheral neuropathy   . Hearing loss   . Rash   . Joint pain   . Ischemic bowel disease (HCC)   . Cyst   . Wegener's granulomatosis (HCC)   . Nasal bleeding   . Hypertension   . Hypothyroidism   . Anxiety     generalized  . Chronic headache   . Chronic abdominal pain   . Chronic foot pain     Past Surgical History  Procedure Laterality Date  . Tubal ligation  1992    Social History  Substance Use Topics  . Smoking status: Former Smoker    Quit date: 05/06/2007  . Smokeless tobacco: Never Used  . Alcohol Use: No     Family History  Problem Relation Age of Onset  . Diabetes Mother   . Hypertension Mother   . Breast cancer Mother   . Diabetes Sister   . Hypertension Sister     No Known Allergies  Medication list has been reviewed and updated.  Current Outpatient Prescriptions on File Prior to Visit  Medication Sig Dispense Refill  . azaTHIOprine (IMURAN) 50 MG tablet Take 100 mg by mouth 2 (two) times daily.    Marland Kitchen. estradiol (VIVELLE-DOT) 0.05 MG/24HR patch Place 1 patch (0.05 mg total) onto the skin 2 (two) times a week. 8 patch 11  . fentaNYL (DURAGESIC - DOSED MCG/HR) 50 MCG/HR Place 1 patch onto the skin every 3 (three) days.  0  . lisinopril (PRINIVIL,ZESTRIL) 20 MG tablet Take 20 mg by mouth daily.    . progesterone (PROMETRIUM) 200 MG capsule Take daily at bedtime day 1-12 of each month 36 capsule 4  . Tapentadol HCl (NUCYNTA) 75 MG TABS Take 75 mg by mouth 3 (three) times daily as needed (pain).     . naproxen sodium (ANAPROX DS) 550 MG tablet Take 1 tablet (550 mg total) by mouth 2 (two) times daily  with a meal. (Patient not taking: Reported on 08/17/2015) 40 tablet 0   No current facility-administered medications on file prior to visit.    Review of Systems  Constitutional: Negative for fever and chills.  HENT: Negative for ear discharge, ear pain and sore throat.   Eyes: Negative for blurred vision and double vision.  Respiratory: Negative for cough, shortness of breath and wheezing.   Cardiovascular: Positive for chest pain. Negative for palpitations and leg swelling.  Gastrointestinal: Negative for nausea, vomiting and diarrhea.  Genitourinary: Negative for dysuria, frequency and hematuria.  Skin: Negative for itching and rash.  Neurological: Negative for dizziness and headaches.     Physical Examination: BP 155/88 mmHg  Pulse 85  Temp(Src) 98.3 F (36.8 C) (Oral)  Resp 16  Ht 5\' 3"  (1.6 m)  Wt 180 lb (81.647 kg)  BMI 31.89 kg/m2  SpO2 99% Ideal Body Weight: Weight  in (lb) to have BMI = 25: 140.8  Physical Exam  Constitutional: She is oriented to person, place, and time. She appears well-developed and well-nourished. No distress.  HENT:  Head: Normocephalic and atraumatic.  Right Ear: External ear normal.  Left Ear: External ear normal.  Eyes: Conjunctivae and EOM are normal. Pupils are equal, round, and reactive to light.  Cardiovascular: Normal rate, regular rhythm and normal heart sounds.  Exam reveals no friction rub.   No murmur heard. Pulses:      Carotid pulses are 2+ on the right side, and 2+ on the left side.      Radial pulses are 2+ on the right side, and 2+ on the left side.       Dorsalis pedis pulses are 2+ on the right side, and 2+ on the left side.  Pulmonary/Chest: Effort normal. No respiratory distress. She has no wheezes.  Musculoskeletal: She exhibits no edema.  Neurological: She is alert and oriented to person, place, and time.  Skin: She is not diaphoretic.  Psychiatric: She has a normal mood and affect. Her behavior is normal.     Assessment and Plan: Geanie Kenningicole R Daye is a 50 y.o. female who is here today for chest pain. amb ref placed.  ekg reviewed.  Stat troponin placed. Advised no heavy exertion. Refilling blood pressure medication at this time.  Essential hypertension - Plan: POCT CBC, TSH, EKG 12-Lead, COMPLETE METABOLIC PANEL WITH GFR, POCT urinalysis dipstick, lisinopril (PRINIVIL,ZESTRIL) 20 MG tablet  Chest pain, unspecified chest pain type - Plan: POCT CBC, TSH, EKG 12-Lead, COMPLETE METABOLIC PANEL WITH GFR, POCT urinalysis dipstick, Troponin I  Annual physical exam  Screening for thyroid disorder  Screening for metabolic disorder  Screening for lipid disorders  Screening for deficiency anemia    Trena PlattStephanie English, PA-C [facility] 08/17/2015 2:12 PM

## 2015-11-26 ENCOUNTER — Other Ambulatory Visit: Payer: Self-pay | Admitting: Physician Assistant

## 2015-11-26 DIAGNOSIS — I10 Essential (primary) hypertension: Secondary | ICD-10-CM

## 2016-02-06 ENCOUNTER — Other Ambulatory Visit: Payer: Self-pay | Admitting: Physician Assistant

## 2016-02-06 DIAGNOSIS — I10 Essential (primary) hypertension: Secondary | ICD-10-CM

## 2016-05-20 ENCOUNTER — Other Ambulatory Visit: Payer: Self-pay | Admitting: *Deleted

## 2016-05-20 MED ORDER — ESTRADIOL 0.05 MG/24HR TD PTTW: 1.0000 | MEDICATED_PATCH | TRANSDERMAL | 0 refills | Status: DC

## 2016-09-18 ENCOUNTER — Ambulatory Visit (INDEPENDENT_AMBULATORY_CARE_PROVIDER_SITE_OTHER): Payer: Managed Care, Other (non HMO) | Admitting: Physician Assistant

## 2016-09-18 ENCOUNTER — Encounter: Payer: Self-pay | Admitting: Physician Assistant

## 2016-09-18 VITALS — BP 146/86 | HR 90 | Temp 98.7°F | Resp 16 | Ht 61.75 in | Wt 177.8 lb

## 2016-09-18 DIAGNOSIS — M3130 Wegener's granulomatosis without renal involvement: Secondary | ICD-10-CM

## 2016-09-18 DIAGNOSIS — Z1231 Encounter for screening mammogram for malignant neoplasm of breast: Secondary | ICD-10-CM | POA: Diagnosis not present

## 2016-09-18 DIAGNOSIS — I10 Essential (primary) hypertension: Secondary | ICD-10-CM

## 2016-09-18 DIAGNOSIS — Z1239 Encounter for other screening for malignant neoplasm of breast: Secondary | ICD-10-CM

## 2016-09-18 LAB — CMP14+EGFR
A/G RATIO: 1.5 (ref 1.2–2.2)
ALT: 11 IU/L (ref 0–32)
AST: 19 IU/L (ref 0–40)
Albumin: 4.3 g/dL (ref 3.5–5.5)
Alkaline Phosphatase: 93 IU/L (ref 39–117)
BILIRUBIN TOTAL: 0.2 mg/dL (ref 0.0–1.2)
BUN / CREAT RATIO: 14 (ref 9–23)
BUN: 11 mg/dL (ref 6–24)
CHLORIDE: 100 mmol/L (ref 96–106)
CO2: 24 mmol/L (ref 20–29)
Calcium: 10.4 mg/dL — ABNORMAL HIGH (ref 8.7–10.2)
Creatinine, Ser: 0.77 mg/dL (ref 0.57–1.00)
GFR calc non Af Amer: 92 mL/min/{1.73_m2} (ref >59)
GFR, EST AFRICAN AMERICAN: 106 mL/min/{1.73_m2} (ref >59)
GLOBULIN, TOTAL: 2.9 g/dL (ref 1.5–4.5)
Glucose: 134 mg/dL — ABNORMAL HIGH (ref 65–99)
POTASSIUM: 4.6 mmol/L (ref 3.5–5.2)
SODIUM: 139 mmol/L (ref 134–144)
TOTAL PROTEIN: 7.2 g/dL (ref 6.0–8.5)

## 2016-09-18 MED ORDER — LISINOPRIL 20 MG PO TABS: 20.0000 mg | ORAL_TABLET | Freq: Every day | ORAL | 1 refills | Status: DC

## 2016-09-18 MED ORDER — AZATHIOPRINE 50 MG PO TABS: 100.0000 mg | ORAL_TABLET | Freq: Two times a day (BID) | ORAL | 0 refills | Status: DC

## 2016-09-18 NOTE — Progress Notes (Signed)
PRIMARY CARE AT [facility] [address], [place] [address] [phone]  Date:  09/18/2016   Name:  Laura [name]   DOB:  1965-11-15   MRN:  [id number]  PCP:  Aldean Jewett, MD    History of Present Illness:  Laura [name] is a 51 y.o. female patient who presents to PCP with  Chief Complaint  Patient presents with  . Medication Refill    PER PATIENT WITHOUT x 6 months     She is eating without restrictions.  She eats a lot of salt no matter what she is eating.   Exercise: she is starting to take water aerobics 3 times per week.    She works as a Web designer, and has to walk a lot, she reports.    No chest pains, palpitations, sob,  She has some leg swelling, followed by her vein and vascular doctor.    Wt Readings from Last 3 Encounters:  09/18/16 177 lb 12.8 oz (80.6 kg)  08/17/15 180 lb (81.6 kg)  06/15/15 175 lb (79.4 kg)        Patient Active Problem List   Diagnosis Date Noted  . 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 Wegener's granulomatosis ([place]) 08/15/2011  . Hypertension   . Hypothyroidism   . Anxiety   . Mononeuritis lower limb 06/14/2011  . Chronic pain syndrome 06/14/2011    Past Medical History:  Diagnosis Date  . Anxiety    generalized  . Chronic abdominal pain   . Chronic foot pain   . Chronic headache   . Cyst   . Hearing loss   . Hypertension   . Hypothyroidism   . Ischemic bowel disease ([place])   . Joint pain   . Mononeuritis multiplex   . Nasal bleeding   . Rash   . Unspecified hereditary and idiopathic peripheral neuropathy   . Wegener's granulomatosis ([place])     Past Surgical History:  Procedure Laterality Date  . TUBAL LIGATION  1992    Social History  Substance Use Topics  . Smoking status: Former Smoker    Quit date: 05/06/2007  . Smokeless tobacco: Never Used  . Alcohol use No    Family History  Problem Relation Age of Onset  . Diabetes Mother   . Hypertension Mother   . Breast cancer Mother   .  Diabetes Sister   . Hypertension Sister     No Known Allergies  Medication list has been reviewed and updated.  Current Outpatient Prescriptions on File Prior to Visit  Medication Sig Dispense Refill  . azaTHIOprine (IMURAN) 50 MG tablet Take 100 mg by mouth 2 (two) times daily.    Marland Kitchen estradiol (VIVELLE-DOT) 0.05 MG/24HR patch Place 1 patch (0.05 mg total) onto the skin 2 (two) times a week. 24 patch 0  . fentaNYL (DURAGESIC - DOSED MCG/HR) 50 MCG/HR Place 1 patch onto the skin every 3 (three) days.  0  . lisinopril (PRINIVIL,ZESTRIL) 20 MG tablet take 1 tablet by mouth once daily 30 tablet 0  . progesterone (PROMETRIUM) 200 MG capsule Take daily at bedtime day 1-12 of each month 36 capsule 4  . Tapentadol HCl (NUCYNTA) 75 MG TABS Take 75 mg by mouth 3 (three) times daily as needed (pain).      No current facility-administered medications on file prior to visit.     ROS ROS otherwise unremarkable unless listed above.  Physical Examination: BP (!) 146/86 (BP Location: Right Arm, Patient Position: Sitting, Cuff  Size: Normal)   Pulse 90   Temp 98.7 F (37.1 C) (Oral)   Resp 16   Ht 5' 1.75" (1.568 m)   Wt 177 lb 12.8 oz (80.6 kg)   SpO2 99%   BMI 32.78 kg/m  Ideal Body Weight: Weight in (lb) to have BMI = 25: 135.3  Physical Exam  Constitutional: She is oriented to person, place, and time. She appears well-developed and well-nourished. No distress.  HENT:  Head: Normocephalic and atraumatic.  Right Ear: External ear normal.  Left Ear: External ear normal.  Eyes: Pupils are equal, round, and reactive to light. Conjunctivae and EOM are normal.  Cardiovascular: Normal rate and regular rhythm.  Exam reveals no friction rub.   No murmur heard. Pulmonary/Chest: Effort normal. No respiratory distress. She has no wheezes.  Neurological: She is alert and oriented to person, place, and time.  Skin: Skin is warm and dry. Capillary refill takes less than 2 seconds. Rash (posterior  lower extremity at the knee with non raised erythematous patches) noted. She is not diaphoretic.  Psychiatric: She has a normal mood and affect. Her behavior is normal.     Assessment and Plan: Laura [name] is a 51 y.o. female who is here today for cc of refill of the lisinprol, and recheck of her htn Advised dash diet, also advised to try salt substitute. She will follow up with her rheumatologist in 1.5 weeks where she will have refill of her medication for Wegener and pain relief.   Screening for breast cancer - Plan: MM DIGITAL SCREENING BILATERAL  Wegener's granulomatosis ([place]) - Plan: azaTHIOprine (IMURAN) 50 MG tablet  Essential hypertension - Plan: lisinopril (PRINIVIL,ZESTRIL) 20 MG tablet, CMP14+EGFR  Ivar Drape, PA-C [facility] 7/25/201810:14 AM

## 2016-09-18 NOTE — Progress Notes (Deleted)
[facility] [address], [place] [address] [phone]  Date:  09/18/2016   Name:  Laura [name]   DOB:  14-Oct-1965   MRN:  [id number]  PCP:  Gale JourneyWalsh, Catherine P, MD    History of Present Illness:  Laura [name] is a 51 y.o. female patient who presents to PCP with  Chief Complaint  Patient presents with  . Medication Refill    PER PATIENT WITHOUT x 6 months       Patient Active Problem List   Diagnosis Date Noted  . 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 Wegener's granulomatosis ([facility]) 08/15/2011  . Hypertension   . Hypothyroidism   . Anxiety   . Mononeuritis lower limb 06/14/2011  . Chronic pain syndrome 06/14/2011    Past Medical History:  Diagnosis Date  . Anxiety    generalized  . Chronic abdominal pain   . Chronic foot pain   . Chronic headache   . Cyst   . Hearing loss   . Hypertension   . Hypothyroidism   . Ischemic bowel disease ([facility])   . Joint pain   . Mononeuritis multiplex   . Nasal bleeding   . Rash   . Unspecified hereditary and idiopathic peripheral neuropathy   . Wegener's granulomatosis ([facility])     Past Surgical History:  Procedure Laterality Date  . TUBAL LIGATION  1992    Social History  Substance Use Topics  . Smoking status: Former Smoker    Quit date: 05/06/2007  . Smokeless tobacco: Never Used  . Alcohol use No    Family History  Problem Relation Age of Onset  . Diabetes Mother   . Hypertension Mother   . Breast cancer Mother   . Diabetes Sister   . Hypertension Sister     No Known Allergies  Medication list has been reviewed and updated.  Current Outpatient Prescriptions on File Prior to Visit  Medication Sig Dispense Refill  . azaTHIOprine (IMURAN) 50 MG tablet Take 100 mg by mouth 2 (two) times daily.    Marland Kitchen. estradiol (VIVELLE-DOT) 0.05 MG/24HR patch Place 1 patch (0.05 mg total) onto the skin 2 (two) times a week. 24 patch 0  . fentaNYL (DURAGESIC - DOSED MCG/HR) 50 MCG/HR Place 1  patch onto the skin every 3 (three) days.  0  . lisinopril (PRINIVIL,ZESTRIL) 20 MG tablet take 1 tablet by mouth once daily 30 tablet 0  . progesterone (PROMETRIUM) 200 MG capsule Take daily at bedtime day 1-12 of each month 36 capsule 4  . Tapentadol HCl (NUCYNTA) 75 MG TABS Take 75 mg by mouth 3 (three) times daily as needed (pain).      No current facility-administered medications on file prior to visit.     ROS ROS otherwise unremarkable unless listed above.  Physical Examination: BP (!) 146/86 (BP Location: Right Arm, Patient Position: Sitting, Cuff Size: Normal)   Pulse 90   Temp 98.7 F (37.1 C) (Oral)   Resp 16   Ht 5' 1.75" (1.568 m)   Wt 177 lb 12.8 oz (80.6 kg)   SpO2 99%   BMI 32.78 kg/m  Ideal Body Weight: Weight in (lb) to have BMI = 25: 135.3  Physical Exam   Assessment and Plan: Laura [name] is a 51 y.o. female who is here today  There are no diagnoses linked to this encounter.  Trena PlattStephanie Jarian Longoria, PA-C [facility] 09/18/2016 9:54 AM

## 2016-09-18 NOTE — Patient Instructions (Addendum)
Make sure to follow up with your doctor for refill of the azathioprine Please try using Mrs. Dash. DASH Eating Plan DASH stands for "Dietary Approaches to Stop Hypertension." The DASH eating plan is a healthy eating plan that has been shown to reduce high blood pressure (hypertension). It may also reduce your risk for type 2 diabetes, heart disease, and stroke. The DASH eating plan may also help with weight loss. What are tips for following this plan? General guidelines  Avoid eating more than 2,300 mg (milligrams) of salt (sodium) a day. If you have hypertension, you may need to reduce your sodium intake to 1,500 mg a day.  Limit alcohol intake to no more than 1 drink a day for nonpregnant women and 2 drinks a day for men. One drink equals 12 oz of beer, 5 oz of wine, or 1 oz of hard liquor.  Work with your health care provider to maintain a healthy body weight or to lose weight. Ask what an ideal weight is for you.  Get at least 30 minutes of exercise that causes your heart to beat faster (aerobic exercise) most days of the week. Activities may include walking, swimming, or biking.  Work with your health care provider or diet and nutrition specialist (dietitian) to adjust your eating plan to your individual calorie needs. Reading food labels  Check food labels for the amount of sodium per serving. Choose foods with less than 5 percent of the Daily Value of sodium. Generally, foods with less than 300 mg of sodium per serving fit into this eating plan.  To find whole grains, look for the word "whole" as the first word in the ingredient list. Shopping  Buy products labeled as "low-sodium" or "no salt added."  Buy fresh foods. Avoid canned foods and premade or frozen meals. Cooking  Avoid adding salt when cooking. Use salt-free seasonings or herbs instead of table salt or sea salt. Check with your health care provider or pharmacist before using salt substitutes.  Do not fry foods. Cook  foods using healthy methods such as baking, boiling, grilling, and broiling instead.  Cook with heart-healthy oils, such as olive, canola, soybean, or sunflower oil. Meal planning   Eat a balanced diet that includes: ? 5 or more servings of fruits and vegetables each day. At each meal, try to fill half of your plate with fruits and vegetables. ? Up to 6-8 servings of whole grains each day. ? Less than 6 oz of lean meat, poultry, or fish each day. A 3-oz serving of meat is about the same size as a deck of cards. One egg equals 1 oz. ? 2 servings of low-fat dairy each day. ? A serving of nuts, seeds, or beans 5 times each week. ? Heart-healthy fats. Healthy fats called Omega-3 fatty acids are found in foods such as flaxseeds and coldwater fish, like sardines, salmon, and mackerel.  Limit how much you eat of the following: ? Canned or prepackaged foods. ? Food that is high in trans fat, such as fried foods. ? Food that is high in saturated fat, such as fatty meat. ? Sweets, desserts, sugary drinks, and other foods with added sugar. ? Full-fat dairy products.  Do not salt foods before eating.  Try to eat at least 2 vegetarian meals each week.  Eat more home-cooked food and less restaurant, buffet, and fast food.  When eating at a restaurant, ask that your food be prepared with less salt or no salt, if possible. What  foods are recommended? The items listed may not be a complete list. Talk with your dietitian about what dietary choices are best for you. Grains Whole-grain or whole-wheat bread. Whole-grain or whole-wheat pasta. Brown rice. [place] Morrow. Bulgur. Whole-grain and low-sodium cereals. Pita bread. Low-fat, low-sodium crackers. Whole-wheat flour tortillas. Vegetables Fresh or frozen vegetables (raw, steamed, roasted, or grilled). Low-sodium or reduced-sodium tomato and vegetable juice. Low-sodium or reduced-sodium tomato sauce and tomato paste. Low-sodium or reduced-sodium  canned vegetables. Fruits All fresh, dried, or frozen fruit. Canned fruit in natural juice (without added sugar). Meat and other protein foods Skinless chicken or [place]. Ground chicken or [place]. Pork with fat trimmed off. Fish and seafood. Egg whites. Dried beans, peas, or lentils. Unsalted nuts, nut butters, and seeds. Unsalted canned beans. Lean cuts of beef with fat trimmed off. Low-sodium, lean deli meat. Dairy Low-fat (1%) or fat-free (skim) milk. Fat-free, low-fat, or reduced-fat cheeses. Nonfat, low-sodium ricotta or cottage cheese. Low-fat or nonfat yogurt. Low-fat, low-sodium cheese. Fats and oils Soft margarine without trans fats. Vegetable oil. Low-fat, reduced-fat, or light mayonnaise and salad dressings (reduced-sodium). Canola, safflower, olive, soybean, and sunflower oils. Avocado. Seasoning and other foods Herbs. Spices. Seasoning mixes without salt. Unsalted popcorn and pretzels. Fat-free sweets. What foods are not recommended? The items listed may not be a complete list. Talk with your dietitian about what dietary choices are best for you. Grains Baked goods made with fat, such as croissants, muffins, or some breads. Dry pasta or rice meal packs. Vegetables Creamed or fried vegetables. Vegetables in a cheese sauce. Regular canned vegetables (not low-sodium or reduced-sodium). Regular canned tomato sauce and paste (not low-sodium or reduced-sodium). Regular tomato and vegetable juice (not low-sodium or reduced-sodium). Angie Fava. Olives. Fruits Canned fruit in a light or heavy syrup. Fried fruit. Fruit in cream or butter sauce. Meat and other protein foods Fatty cuts of meat. Ribs. Fried meat. Berniece Salines. Sausage. Bologna and other processed lunch meats. Salami. Fatback. Hotdogs. Bratwurst. Salted nuts and seeds. Canned beans with added salt. Canned or smoked fish. Whole eggs or egg yolks. Chicken or [place] with skin. Dairy Whole or 2% milk, cream, and half-and-half. Whole or  full-fat cream cheese. Whole-fat or sweetened yogurt. Full-fat cheese. Nondairy creamers. Whipped toppings. Processed cheese and cheese spreads. Fats and oils Butter. Stick margarine. Lard. Shortening. Ghee. Bacon fat. Tropical oils, such as coconut, palm kernel, or palm oil. Seasoning and other foods Salted popcorn and pretzels. Onion salt, garlic salt, seasoned salt, table salt, and sea salt. Worcestershire sauce. Tartar sauce. Barbecue sauce. Teriyaki sauce. Soy sauce, including reduced-sodium. Steak sauce. Canned and packaged gravies. Fish sauce. Oyster sauce. Cocktail sauce. Horseradish that you find on the shelf. Ketchup. Mustard. Meat flavorings and tenderizers. Bouillon cubes. Hot sauce and Tabasco sauce. Premade or packaged marinades. Premade or packaged taco seasonings. Relishes. Regular salad dressings. Where to find more information:  National Heart, Lung, and [place]: https://wilson-eaton.com/  American Heart Association: www.heart.org Summary  The DASH eating plan is a healthy eating plan that has been shown to reduce high blood pressure (hypertension). It may also reduce your risk for type 2 diabetes, heart disease, and stroke.  With the DASH eating plan, you should limit salt (sodium) intake to 2,300 mg a day. If you have hypertension, you may need to reduce your sodium intake to 1,500 mg a day.  When on the DASH eating plan, aim to eat more fresh fruits and vegetables, whole grains, lean proteins, low-fat dairy, and heart-healthy fats.  Work with  your health care provider or diet and nutrition specialist (dietitian) to adjust your eating plan to your individual calorie needs. This information is not intended to replace advice given to you by your health care provider. Make sure you discuss any questions you have with your health care provider. Document Released: 01/31/2011 Document Revised: 02/05/2016 Document Reviewed: 02/05/2016 Elsevier Interactive Patient Education  2017  [employer]      IF you received an x-ray today, you will receive an invoice from [facility]. Please contact [facility] at [phone] with questions or concerns regarding your invoice.   IF you received labwork today, you will receive an invoice from [facility]. Please contact [facility] at [phone] with questions or concerns regarding your invoice.   Our billing staff will not be able to assist you with questions regarding bills from these companies.  You will be contacted with the lab results as soon as they are available. The fastest way to get your results is to activate your My Chart account. Instructions are located on the last page of this paperwork. If you have not heard from us regarding the results in 2 weeks, please contact this office.

## 2016-09-19 ENCOUNTER — Ambulatory Visit: Payer: Managed Care, Other (non HMO) | Admitting: Physician Assistant

## 2016-09-23 ENCOUNTER — Other Ambulatory Visit: Payer: Self-pay | Admitting: Physician Assistant

## 2016-09-23 DIAGNOSIS — Z1231 Encounter for screening mammogram for malignant neoplasm of breast: Secondary | ICD-10-CM

## 2016-10-02 ENCOUNTER — Ambulatory Visit
Admission: RE | Admit: 2016-10-02 | Discharge: 2016-10-02 | Disposition: A | Discharge status: Home / Self Care | Payer: Managed Care, Other (non HMO) | Source: Ambulatory Visit | Attending: Physician Assistant | Admitting: Physician Assistant

## 2016-10-02 DIAGNOSIS — Z1231 Encounter for screening mammogram for malignant neoplasm of breast: Secondary | ICD-10-CM

## 2016-10-07 ENCOUNTER — Encounter: Payer: Self-pay | Admitting: *Deleted

## 2016-10-07 NOTE — Progress Notes (Signed)
Letter sent.

## 2016-10-10 ENCOUNTER — Telehealth: Payer: Self-pay | Admitting: Physician Assistant

## 2016-10-10 DIAGNOSIS — M3130 Wegener's granulomatosis without renal involvement: Secondary | ICD-10-CM

## 2016-10-10 NOTE — Telephone Encounter (Signed)
Yes, she should contact us with who she wants to be sent to, as well as why she needs a referral for rheumatology.

## 2016-10-10 NOTE — Telephone Encounter (Signed)
Pt called and left vm requesting referral for Rheumatology. She can be contacted at [phone]. I left a message to see if she had a preference of where to be sent. Please advise. Thanks!

## 2016-10-15 ENCOUNTER — Other Ambulatory Visit: Payer: Self-pay | Admitting: Women's Health

## 2016-10-15 NOTE — Telephone Encounter (Signed)
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 She did not specify a certain office. Pt can be contacted at [phone].

## 2016-11-04 NOTE — Telephone Encounter (Signed)
Left detailed message.   

## 2016-11-04 NOTE — Telephone Encounter (Signed)
Ok.  So she reported that she would see her rheumatologist in 1.5 weeks.  What happened to it.  And does she want me to refer her back there.  Also who is Dr. Clent RidgesWalsh.  Is that her pcp? She should request the referral there if it is.

## 2016-11-06 NOTE — Telephone Encounter (Signed)
Referral sent 

## 2016-11-06 NOTE — Telephone Encounter (Signed)
Pt returned message left stating [place] but was having problems there and did not want to return. She said Dr. Clent RidgesWalsh was not her primary and [facility]. Thanks!

## 2016-11-07 NOTE — Telephone Encounter (Signed)
Called pt this morning and informed her a referral has been sent and if she has anymore questions to give us a call here at the office.

## 2016-11-11 ENCOUNTER — Telehealth: Payer: Self-pay | Admitting: *Deleted

## 2016-11-11 NOTE — Telephone Encounter (Signed)
Okay to refill Remind Needs to keep her annual exam.

## 2016-11-11 NOTE — Telephone Encounter (Signed)
Pt has annual scheduled on 12/18/16 requesting refill on vivelle dot patches 0.05 mg. Annual was due in April.  Okay to send?

## 2016-11-12 MED ORDER — ESTRADIOL 0.05 MG/24HR TD PTTW: 1.0000 | MEDICATED_PATCH | TRANSDERMAL | 0 refills | Status: DC

## 2016-11-12 NOTE — Telephone Encounter (Signed)
Left message for pt to call.

## 2016-11-12 NOTE — Telephone Encounter (Signed)
Pt aware, Rx sent. 

## 2016-11-25 ENCOUNTER — Other Ambulatory Visit: Payer: Self-pay | Admitting: Physician Assistant

## 2016-11-25 DIAGNOSIS — M3130 Wegener's granulomatosis without renal involvement: Secondary | ICD-10-CM

## 2016-11-26 NOTE — Telephone Encounter (Signed)
Pharmacy requesting refill for Imuran, patient has referral to Rheumatology. OK to fill until she is seen?

## 2016-12-18 ENCOUNTER — Ambulatory Visit (INDEPENDENT_AMBULATORY_CARE_PROVIDER_SITE_OTHER): Payer: Managed Care, Other (non HMO) | Admitting: Women's Health

## 2016-12-18 ENCOUNTER — Encounter: Payer: Self-pay | Admitting: Women's Health

## 2016-12-18 VITALS — BP 154/80 | Ht 61.0 in | Wt 179.0 lb

## 2016-12-18 DIAGNOSIS — Z79890 Hormone replacement therapy (postmenopausal): Secondary | ICD-10-CM

## 2016-12-18 DIAGNOSIS — Z01419 Encounter for gynecological examination (general) (routine) without abnormal findings: Secondary | ICD-10-CM

## 2016-12-18 MED ORDER — PROGESTERONE MICRONIZED 200 MG PO CAPS: ORAL_CAPSULE | ORAL | 4 refills | Status: DC

## 2016-12-18 MED ORDER — ESTRADIOL 0.05 MG/24HR TD PTTW: 1.0000 | MEDICATED_PATCH | TRANSDERMAL | 4 refills | Status: DC

## 2016-12-18 NOTE — Progress Notes (Signed)
Geanie Kenningicole R Kovich 1965/05/10 [id number]    History:    Presents for annual exam.  Postmenopausal on HRT with good relief of symptoms. No bleeding. Hypertension, hypothyroidism, managed by primary care. 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 History of a BTL. Normal Pap and mammogram history. Same partner.  Past medical history, past surgical history, family history and social history were all reviewed and documented in the EPIC chart. 2 children, one grandchild all doing well. 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  ROS:  A ROS was performed and pertinent positives and negatives are included.  Exam:  Vitals:   12/18/16 1507  BP: (!) 154/80  Weight: 179 lb (81.2 kg)  Height: 5\' 1"  (1.549 m)   Body mass index is 33.82 kg/m.   General appearance:  Normal Thyroid:  Symmetrical, normal in size, without palpable masses or nodularity. Respiratory  Auscultation:  Clear without wheezing or rhonchi Cardiovascular  Auscultation:  Regular rate, without rubs, murmurs or gallops  Edema/varicosities:  Not grossly evident Abdominal  Soft,nontender, without masses, guarding or rebound.  Liver/spleen:  No organomegaly noted  Hernia:  None appreciated  Skin  Inspection:  Grossly normal   Breasts: Examined lying and sitting.     Right: Without masses, retractions, discharge or axillary adenopathy.     Left: Without masses, retractions, discharge or axillary adenopathy. Gentitourinary   Inguinal/mons:  Normal without inguinal adenopathy  External genitalia:  Normal  BUS/Urethra/Skene's glands:  Normal  Vagina:  Normal  Cervix:  Normal  Uterus:   normal in size, shape and contour.  Midline and mobile  Adnexa/parametria:     Rt: Without masses or tenderness.   Lt: Without masses or tenderness.  Anus and perineum: Normal  Digital rectal exam: Normal sphincter tone without palpated masses or tenderness  Assessment/Plan:  51 y.o. SBF G2 P2 for  annual exam with no complaints.  Postmenopausal on HRT with no bleeding Hypertension/hyperthyroidism-primary care manages labs and meds 2005 Wegener's granulomatosis-foot pain/left ear hearing loss-pain medication Obesity  Plan: HRT risks of blood clots, strokes and breast cancer reviewed, would like to continue has had good relief of symptoms, better rest. The Vivelle patch 0.05 twice weekly, Prometrium 200 mg at bedtime day 1 through 12 of each month, prescriptions for both given. SBE's, annual screening mammogram, calcium rich diet, vitamin D 2000 daily encouraged. Reviewed importance of increasing exercise and decreasing calories/carbs for weight loss. Reviewed blood pressure slightly elevated today. Pap with HR HPV typing, new screening guidelines reviewed.Harrington Challenger.    [facility]NP, 3:40 PM 12/18/2016

## 2016-12-18 NOTE — Patient Instructions (Addendum)
Carbohydrate Counting for Diabetes Mellitus, Adult Carbohydrate counting is a method for keeping track of how many carbohydrates you eat. Eating carbohydrates naturally increases the amount of sugar (glucose) in the blood. Counting how many carbohydrates you eat helps keep your blood glucose within normal limits, which helps you manage your diabetes (diabetes mellitus). It is important to know how many carbohydrates you can safely have in each meal. This is different for every person. A diet and nutrition specialist (registered dietitian) can help you make a meal plan and calculate how many carbohydrates you should have at each meal and snack. Carbohydrates are found in the following foods:  Grains, such as breads and cereals.  Dried beans and soy products.  Starchy vegetables, such as potatoes, peas, and corn.  Fruit and fruit juices.  Milk and yogurt.  Sweets and snack foods, such as cake, cookies, candy, chips, and soft drinks.  How do I count carbohydrates? There are two ways to count carbohydrates in food. You can use either of the methods or a combination of both. Reading "Nutrition Facts" on packaged food The "Nutrition Facts" list is included on the labels of almost all packaged foods and beverages in the [place] It includes:  The serving size.  Information about nutrients in each serving, including the grams (g) of carbohydrate per serving.  To use the "Nutrition Facts":  Decide how many servings you will have.  Multiply the number of servings by the number of carbohydrates per serving.  The resulting number is the total amount of carbohydrates that you will be having.  Learning standard serving sizes of other foods When you eat foods containing carbohydrates that are not packaged or do not include "Nutrition Facts" on the label, you need to measure the servings in order to count the amount of carbohydrates:  Measure the foods that you will eat with a food scale or  measuring cup, if needed.  Decide how many standard-size servings you will eat.  Multiply the number of servings by 15. Most carbohydrate-rich foods have about 15 g of carbohydrates per serving. ? For example, if you eat 8 oz (170 g) of strawberries, you will have eaten 2 servings and 30 g of carbohydrates (2 servings x 15 g = 30 g).  For foods that have more than one food mixed, such as soups and casseroles, you must count the carbohydrates in each food that is included.  The following list contains standard serving sizes of common carbohydrate-rich foods. Each of these servings has about 15 g of carbohydrates:   hamburger bun or  English muffin.   oz (15 mL) syrup.   oz (14 g) jelly.  1 slice of bread.  1 six-inch tortilla.  3 oz (85 g) cooked rice or pasta.  4 oz (113 g) cooked dried beans.  4 oz (113 g) starchy vegetable, such as peas, corn, or potatoes.  4 oz (113 g) hot cereal.  4 oz (113 g) mashed potatoes or  of a large baked potato.  4 oz (113 g) canned or frozen fruit.  4 oz (120 mL) fruit juice.  4-6 crackers.  6 chicken nuggets.  6 oz (170 g) unsweetened dry cereal.  6 oz (170 g) plain fat-free yogurt or yogurt sweetened with artificial sweeteners.  8 oz (240 mL) milk.  8 oz (170 g) fresh fruit or one small piece of fruit.  24 oz (680 g) popped popcorn.  Example of carbohydrate counting Sample meal  3 oz (85 g) chicken breast.    6 oz (170 g) brown rice.  4 oz (113 g) corn.  8 oz (240 mL) milk.  8 oz (170 g) strawberries with sugar-free whipped topping. Carbohydrate calculation 1. Identify the foods that contain carbohydrates: ? Rice. ? Corn. ? Milk. ? Strawberries. 2. Calculate how many servings you have of each food: ? 2 servings rice. ? 1 serving corn. ? 1 serving milk. ? 1 serving strawberries. 3. Multiply each number of servings by 15 g: ? 2 servings rice x 15 g = 30 g. ? 1 serving corn x 15 g = 15 g. ? 1 serving milk x 15  g = 15 g. ? 1 serving strawberries x 15 g = 15 g. 4. Add together all of the amounts to find the total grams of carbohydrates eaten: ? 30 g + 15 g + 15 g + 15 g = 75 g of carbohydrates total. This information is not intended to replace advice given to you by your health care provider. Make sure you discuss any questions you have with your health care provider. Document Released: 02/11/2005 Document Revised: 09/01/2015 Document Reviewed: 07/26/2015 Elsevier Interactive Patient Education  2018 [employer] Health Maintenance for Postmenopausal Women Menopause is a normal process in which your reproductive ability comes to an end. This process happens gradually over a span of months to years, usually between the ages of 48 and 55. Menopause is complete when you have missed 12 consecutive menstrual periods. It is important to talk with your health care provider about some of the most common conditions that affect postmenopausal women, such as heart disease, cancer, and bone loss (osteoporosis). Adopting a healthy lifestyle and getting preventive care can help to promote your health and wellness. Those actions can also lower your chances of developing some of these common conditions. What should I know about menopause? During menopause, you may experience a number of symptoms, such as:  Moderate-to-severe hot flashes.  Night sweats.  Decrease in sex drive.  Mood swings.  Headaches.  Tiredness.  Irritability.  Memory problems.  Insomnia.  Choosing to treat or not to treat menopausal changes is an individual decision that you make with your health care provider. What should I know about hormone replacement therapy and supplements? Hormone therapy products are effective for treating symptoms that are associated with menopause, such as hot flashes and night sweats. Hormone replacement carries certain risks, especially as you become older. If you are thinking about using estrogen or estrogen  with progestin treatments, discuss the benefits and risks with your health care provider. What should I know about heart disease and stroke? Heart disease, heart attack, and stroke become more likely as you age. This may be due, in part, to the hormonal changes that your body experiences during menopause. These can affect how your body processes dietary fats, triglycerides, and cholesterol. Heart attack and stroke are both medical emergencies. There are many things that you can do to help prevent heart disease and stroke:  Have your blood pressure checked at least every 1-2 years. High blood pressure causes heart disease and increases the risk of stroke.  If you are 55-79 years old, ask your health care provider if you should take aspirin to prevent a heart attack or a stroke.  Do not use any tobacco products, including cigarettes, chewing tobacco, or electronic cigarettes. If you need help quitting, ask your health care provider.  It is important to eat a healthy diet and maintain a healthy weight. ? Be sure to include   plenty of vegetables, fruits, low-fat dairy products, and lean protein. ? Avoid eating foods that are high in solid fats, added sugars, or salt (sodium).  Get regular exercise. This is one of the most important things that you can do for your health. ? Try to exercise for at least 150 minutes each week. The type of exercise that you do should increase your heart rate and make you sweat. This is known as moderate-intensity exercise. ? Try to do strengthening exercises at least twice each week. Do these in addition to the moderate-intensity exercise.  Know your numbers.Ask your health care provider to check your cholesterol and your blood glucose. Continue to have your blood tested as directed by your health care provider.  What should I know about cancer screening? There are several types of cancer. Take the following steps to reduce your risk and to catch any cancer development  as early as possible. Breast Cancer  Practice breast self-awareness. ? This means understanding how your breasts normally appear and feel. ? It also means doing regular breast self-exams. Let your health care provider know about any changes, no matter how small.  If you are 40 or older, have a clinician do a breast exam (clinical breast exam or CBE) every year. Depending on your age, family history, and medical history, it may be recommended that you also have a yearly breast X-ray (mammogram).  If you have a family history of breast cancer, talk with your health care provider about genetic screening.  If you are at high risk for breast cancer, talk with your health care provider about having an MRI and a mammogram every year.  Breast cancer (BRCA) gene test is recommended for women who have family members with BRCA-related cancers. Results of the assessment will determine the need for genetic counseling and BRCA1 and for BRCA2 testing. BRCA-related cancers include these types: ? Breast. This occurs in males or females. ? Ovarian. ? Tubal. This may also be called fallopian tube cancer. ? Cancer of the abdominal or pelvic lining (peritoneal cancer). ? Prostate. ? Pancreatic.  Cervical, Uterine, and Ovarian Cancer Your health care provider may recommend that you be screened regularly for cancer of the pelvic organs. These include your ovaries, uterus, and vagina. This screening involves a pelvic exam, which includes checking for microscopic changes to the surface of your cervix (Pap test).  For women ages 21-65, health care providers may recommend a pelvic exam and a Pap test every three years. For women ages 30-65, they may recommend the Pap test and pelvic exam, combined with testing for human papilloma virus (HPV), every five years. Some types of HPV increase your risk of cervical cancer. Testing for HPV may also be done on women of any age who have unclear Pap test results.  Other health  care providers may not recommend any screening for nonpregnant women who are considered low risk for pelvic cancer and have no symptoms. Ask your health care provider if a screening pelvic exam is right for you.  If you have had past treatment for cervical cancer or a condition that could lead to cancer, you need Pap tests and screening for cancer for at least 20 years after your treatment. If Pap tests have been discontinued for you, your risk factors (such as having a new sexual partner) need to be reassessed to determine if you should start having screenings again. Some women have medical problems that increase the chance of getting cervical cancer. In these cases, your   health care provider may recommend that you have screening and Pap tests more often.  If you have a family history of uterine cancer or ovarian cancer, talk with your health care provider about genetic screening.  If you have vaginal bleeding after reaching menopause, tell your health care provider.  There are currently no reliable tests available to screen for ovarian cancer.  Lung Cancer Lung cancer screening is recommended for adults 55-80 years old who are at high risk for lung cancer because of a history of smoking. A yearly low-dose CT scan of the lungs is recommended if you:  Currently smoke.  Have a history of at least 30 pack-years of smoking and you currently smoke or have quit within the past 15 years. A pack-year is smoking an average of one pack of cigarettes per day for one year.  Yearly screening should:  Continue until it has been 15 years since you quit.  Stop if you develop a health problem that would prevent you from having lung cancer treatment.  Colorectal Cancer  This type of cancer can be detected and can often be prevented.  Routine colorectal cancer screening usually begins at age 50 and continues through age 75.  If you have risk factors for colon cancer, your health care provider may  recommend that you be screened at an earlier age.  If you have a family history of colorectal cancer, talk with your health care provider about genetic screening.  Your health care provider may also recommend using home test kits to check for hidden blood in your stool.  A small camera at the end of a tube can be used to examine your colon directly (sigmoidoscopy or colonoscopy). This is done to check for the earliest forms of colorectal cancer.  Direct examination of the colon should be repeated every 5-10 years until age 75. However, if early forms of precancerous polyps or small growths are found or if you have a family history or genetic risk for colorectal cancer, you may need to be screened more often.  Skin Cancer  Check your skin from head to toe regularly.  Monitor any moles. Be sure to tell your health care provider: ? About any new moles or changes in moles, especially if there is a change in a mole's shape or color. ? If you have a mole that is larger than the size of a pencil eraser.  If any of your family members has a history of skin cancer, especially at a young age, talk with your health care provider about genetic screening.  Always use sunscreen. Apply sunscreen liberally and repeatedly throughout the day.  Whenever you are outside, protect yourself by wearing long sleeves, pants, a wide-brimmed hat, and sunglasses.  What should I know about osteoporosis? Osteoporosis is a condition in which bone destruction happens more quickly than new bone creation. After menopause, you may be at an increased risk for osteoporosis. To help prevent osteoporosis or the bone fractures that can happen because of osteoporosis, the following is recommended:  If you are 19-50 years old, get at least 1,000 mg of calcium and at least 600 mg of vitamin D per day.  If you are older than age 50 but younger than age 70, get at least 1,200 mg of calcium and at least 600 mg of vitamin D per  day.  If you are older than age 70, get at least 1,200 mg of calcium and at least 800 mg of vitamin D per day.    Smoking and excessive alcohol intake increase the risk of osteoporosis. Eat foods that are rich in calcium and vitamin D, and do weight-bearing exercises several times each week as directed by your health care provider. What should I know about how menopause affects my mental health? Depression may occur at any age, but it is more common as you become older. Common symptoms of depression include:  Low or sad mood.  Changes in sleep patterns.  Changes in appetite or eating patterns.  Feeling an overall lack of motivation or enjoyment of activities that you previously enjoyed.  Frequent crying spells.  Talk with your health care provider if you think that you are experiencing depression. What should I know about immunizations? It is important that you get and maintain your immunizations. These include:  Tetanus, diphtheria, and pertussis (Tdap) booster vaccine.  Influenza every year before the flu season begins.  Pneumonia vaccine.  Shingles vaccine.  Your health care provider may also recommend other immunizations. This information is not intended to replace advice given to you by your health care provider. Make sure you discuss any questions you have with your health care provider. Document Released: 04/05/2005 Document Revised: 09/01/2015 Document Reviewed: 11/15/2014 Elsevier Interactive Patient Education  2018 [employer].

## 2016-12-18 NOTE — Addendum Note (Signed)
Addended by: Tito DineBONHAM, KIM A on: 12/18/2016 04:20 PM   Modules accepted: Orders

## 2016-12-19 ENCOUNTER — Emergency Department (HOSPITAL_COMMUNITY)
Admission: EM | Admit: 2016-12-19 | Discharge: 2016-12-19 | Disposition: A | Discharge status: Home / Self Care | Payer: Managed Care, Other (non HMO) | Attending: Emergency Medicine | Admitting: Emergency Medicine

## 2016-12-19 ENCOUNTER — Encounter (HOSPITAL_COMMUNITY): Payer: Self-pay

## 2016-12-19 DIAGNOSIS — R109 Unspecified abdominal pain: Secondary | ICD-10-CM | POA: Diagnosis not present

## 2016-12-19 DIAGNOSIS — Z5321 Procedure and treatment not carried out due to patient leaving prior to being seen by health care provider: Secondary | ICD-10-CM | POA: Diagnosis not present

## 2016-12-19 NOTE — ED Notes (Signed)
Writer call for blood draws, no response 

## 2016-12-19 NOTE — ED Notes (Signed)
Pt called for room placement.  Assumed LWBS

## 2016-12-19 NOTE — ED Triage Notes (Signed)
Pt reports epigastric pain and headache starting last night. Denies vomiting or diarrhea. She reports that she thinks that this is a Wegener's flare up, but she cannot get in to see her Rheumotologist for another 3 weeks. A&Ox4. Ambulatory.

## 2016-12-19 NOTE — ED Notes (Signed)
Called Pt for vital recheck in lobby no response. NO response X2

## 2016-12-20 ENCOUNTER — Encounter (HOSPITAL_COMMUNITY): Payer: Self-pay | Admitting: *Deleted

## 2016-12-20 ENCOUNTER — Emergency Department (HOSPITAL_COMMUNITY)
Admission: EM | Admit: 2016-12-20 | Discharge: 2016-12-20 | Disposition: A | Discharge status: Home / Self Care | Payer: Managed Care, Other (non HMO) | Attending: Emergency Medicine | Admitting: Emergency Medicine

## 2016-12-20 DIAGNOSIS — R51 Headache: Secondary | ICD-10-CM | POA: Insufficient documentation

## 2016-12-20 DIAGNOSIS — E039 Hypothyroidism, unspecified: Secondary | ICD-10-CM | POA: Insufficient documentation

## 2016-12-20 DIAGNOSIS — G8929 Other chronic pain: Secondary | ICD-10-CM | POA: Insufficient documentation

## 2016-12-20 DIAGNOSIS — Z87891 Personal history of nicotine dependence: Secondary | ICD-10-CM | POA: Insufficient documentation

## 2016-12-20 DIAGNOSIS — Z79899 Other long term (current) drug therapy: Secondary | ICD-10-CM | POA: Diagnosis not present

## 2016-12-20 DIAGNOSIS — R1013 Epigastric pain: Secondary | ICD-10-CM | POA: Diagnosis not present

## 2016-12-20 DIAGNOSIS — I10 Essential (primary) hypertension: Secondary | ICD-10-CM | POA: Diagnosis not present

## 2016-12-20 DIAGNOSIS — R109 Unspecified abdominal pain: Secondary | ICD-10-CM

## 2016-12-20 DIAGNOSIS — R519 Headache, unspecified: Secondary | ICD-10-CM

## 2016-12-20 LAB — URINALYSIS, ROUTINE W REFLEX MICROSCOPIC
Bacteria, UA: NONE SEEN
Bilirubin Urine: NEGATIVE
Glucose, UA: NEGATIVE mg/dL
Hgb urine dipstick: NEGATIVE
Ketones, ur: NEGATIVE mg/dL
Leukocytes, UA: NEGATIVE
Nitrite: NEGATIVE
Protein, ur: 30 mg/dL — AB
Specific Gravity, Urine: 1.026 (ref 1.005–1.030)
pH: 6 (ref 5.0–8.0)

## 2016-12-20 LAB — CBC
HCT: 36.9 % (ref 36.0–46.0)
Hemoglobin: 11.8 g/dL — ABNORMAL LOW (ref 12.0–15.0)
MCH: 25.3 pg — ABNORMAL LOW (ref 26.0–34.0)
MCHC: 32 g/dL (ref 30.0–36.0)
MCV: 79.2 fL (ref 78.0–100.0)
Platelets: 341 10*3/uL (ref 150–400)
RBC: 4.66 MIL/uL (ref 3.87–5.11)
RDW: 15.7 % — ABNORMAL HIGH (ref 11.5–15.5)
WBC: 8 10*3/uL (ref 4.0–10.5)

## 2016-12-20 LAB — COMPREHENSIVE METABOLIC PANEL
ALT: 11 U/L — ABNORMAL LOW (ref 14–54)
AST: 23 U/L (ref 15–41)
Albumin: 4 g/dL (ref 3.5–5.0)
Alkaline Phosphatase: 73 U/L (ref 38–126)
Anion gap: 12 (ref 5–15)
BUN: 13 mg/dL (ref 6–20)
CO2: 25 mmol/L (ref 22–32)
Calcium: 9.3 mg/dL (ref 8.9–10.3)
Chloride: 102 mmol/L (ref 101–111)
Creatinine, Ser: 0.7 mg/dL (ref 0.44–1.00)
GFR calc Af Amer: >60 mL/min (ref >60)
GFR calc non Af Amer: >60 mL/min (ref >60)
Glucose, Bld: 92 mg/dL (ref 65–99)
Potassium: 3.6 mmol/L (ref 3.5–5.1)
Sodium: 139 mmol/L (ref 135–145)
Total Bilirubin: 0.7 mg/dL (ref 0.3–1.2)
Total Protein: 7.8 g/dL (ref 6.5–8.1)

## 2016-12-20 LAB — LIPASE, BLOOD: Lipase: 35 U/L (ref 11–51)

## 2016-12-20 NOTE — ED Notes (Signed)
Urine has been sent. Pt. Is in no distress.

## 2016-12-20 NOTE — ED Provider Notes (Signed)
[facility]-EMERGENCY DEPT Provider Note   CSN: [id number] Arrival date & time: 12/20/16  1300     History   Chief Complaint Chief Complaint  Patient presents with  . Abdominal Pain  . Headache    HPI Laura [name] is a 51 y.o. female.  HPI   51 year old female with history of Wegener's chronic abd pain, chronic headache, presenting c/o headache and abdominal pain.  For the past 3 days patient has had recurrent frontal headache which she described as a sharp sensation, involving the sinus, waxing and waning, currently rates it 6 out of 10.  There is no associated vision changes, runny nose sneezing coughing, or neck pain.  She also endorses epigastric abdominal pain.  Pain is for the same duration, described as a achy sharp sensation, waxing and waning.  Pain is mild to moderate currently.  She denies having any active chest pain, shortness of breath, productive cough, dysuria, hematuria, vaginal bleeding, vaginal discharge, bowel bladder changes, or rash.  She felt that her symptoms likely due to a Wegener's granulomatosis flare.  IN the past she received transfusion of rituximab with improvement of similar sxs.  Pt wears a Fentanyl patch and also report taking Nucynta for her chronic foot pain.    Past Medical History:  Diagnosis Date  . Anxiety    generalized  . Chronic abdominal pain   . Chronic foot pain   . Chronic headache   . Cyst   . Hearing loss   . Hypertension   . Hypothyroidism   . Ischemic bowel disease (HCC)   . Joint pain   . Mononeuritis multiplex   . Nasal bleeding   . Rash   . Unspecified hereditary and idiopathic peripheral neuropathy   . Wegener's granulomatosis [facility])     Patient Active Problem List   Diagnosis Date Noted  . 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 Wegener's granulomatosis (HCC) 08/15/2011  . Hypertension   . Hypothyroidism   . Anxiety   . Mononeuritis lower limb 06/14/2011  . Chronic pain  syndrome 06/14/2011    Past Surgical History:  Procedure Laterality Date  . TUBAL LIGATION  1992    OB History    Gravida Para Term Preterm AB Living   2 0 0 0 0 2   SAB TAB Ectopic Multiple Live Births   0 0 0 0 0       Home Medications    Prior to Admission medications   Medication Sig Start Date End Date Taking? Authorizing Provider  azaTHIOprine (IMURAN) 50 MG tablet take 2 tablets by mouth twice a day 11/26/16   English, Judeth Cornfield D, PA  estradiol (VIVELLE-DOT) 0.05 MG/24HR patch Place 1 patch (0.05 mg total) onto the skin 2 (two) times a week. 12/19/16   Harrington Challenger, NP  fentaNYL (DURAGESIC - DOSED MCG/HR) 50 MCG/HR Place 1 patch onto the skin every 3 (three) days. 03/20/15   [provider]  lisinopril (PRINIVIL,ZESTRIL) 20 MG tablet Take 1 tablet (20 mg total) by mouth daily. 09/18/16   Trena Platt D, PA  progesterone (PROMETRIUM) 200 MG capsule Take daily at bedtime day 1-12 of each month 12/18/16   Harrington Challenger, NP  Tapentadol HCl (NUCYNTA) 75 MG TABS Take 75 mg by mouth 3 (three) times daily as needed (pain).     [provider]    Family History Family History  Problem Relation Age of Onset  . Diabetes Mother   . Hypertension Mother   .  Breast cancer Mother   . Diabetes Sister   . Hypertension Sister     Social History Social History  Substance Use Topics  . Smoking status: Former Smoker    Quit date: 05/06/2007  . Smokeless tobacco: Never Used  . Alcohol use No     Allergies   Patient has no known allergies.   Review of Systems Review of Systems  All other systems reviewed and are negative.    Physical Exam Updated Vital Signs BP (!) 166/90 (BP Location: Left Arm)   Pulse 80   Temp 98.6 F (37 C) (Oral)   Resp 18   SpO2 100%   Physical Exam  Constitutional: She is oriented to person, place, and time. She appears well-developed and well-nourished. No distress.  HENT:  Head: Atraumatic.  Eyes: Conjunctivae  are normal.  Neck: Neck supple.  No nuchal rigidity  Cardiovascular: Normal rate and regular rhythm.   Pulmonary/Chest: Effort normal and breath sounds normal.  Abdominal: Soft. Bowel sounds are normal. She exhibits no distension. There is tenderness (Mild epigastric tenderness without guarding or rebound tenderness negative Murphy sign, no pain with McBurney's point).  Neurological: She is alert and oriented to person, place, and time.  Neurologic exam:  Speech clear, pupils equal round reactive to light, extraocular movements intact  Normal peripheral visual fields Cranial nerves III through XII normal including no facial droop Follows commands, moves all extremities x4, normal strength to bilateral upper and lower extremities at all major muscle groups including grip Sensation normal to light touch Coordination intact, no limb ataxia, finger-nose-finger normal Rapid alternating movements normal No pronator drift Gait normal   Skin: No rash noted.  Psychiatric: She has a normal mood and affect.  Nursing note and vitals reviewed.    ED Treatments / Results  Labs (all labs ordered are listed, but only abnormal results are displayed) Labs Reviewed  COMPREHENSIVE METABOLIC PANEL - Abnormal; Notable for the following:       Result Value   ALT 11 (*)    All other components within normal limits  CBC - Abnormal; Notable for the following:    Hemoglobin 11.8 (*)    MCH 25.3 (*)    RDW 15.7 (*)    All other components within normal limits  URINALYSIS, ROUTINE W REFLEX MICROSCOPIC - Abnormal; Notable for the following:    Protein, ur 30 (*)    Squamous Epithelial / LPF 6-30 (*)    All other components within normal limits  LIPASE, BLOOD    EKG  EKG Interpretation None       Radiology No results found.  Procedures Procedures (including critical care time)  Medications Ordered in ED Medications - No data to display   Initial Impression / Assessment and Plan / ED  Course  I have reviewed the triage vital signs and the nursing notes.  Pertinent labs & imaging results that were available during my care of the patient were reviewed by me and considered in my medical decision making (see chart for details).     BP (!) 166/90 (BP Location: Left Arm)   Pulse 80   Temp 98.6 F (37 C) (Oral)   Resp 18   SpO2 100%    Final Clinical Impressions(s) / ED Diagnoses   Final diagnoses:  Bad headache  Chronic abdominal pain    New Prescriptions New Prescriptions   No medications on file   5:27 PM Patient endorsed headache and abdominal pain and felt that this is  likely a Wegener's granulomatosis flare.  She is well-appearing, she has no concerning feature in regards to headache.  She has a fairly benign abdominal exam.  She is afebrile, mildly hypertensive.  Her labs are reassuring.  6:45 PM Urine shows no signs of urinary tract infection.  Patient is well-appearing.  I encourage patient to follow-up closely with her rheumatologist for further management of her condition.  Return precautions discussed.   Fayrene Helper, PA-C 12/20/16 1845    Raeford Razor, MD 12/24/16 1336

## 2016-12-20 NOTE — ED Triage Notes (Signed)
Pt complains of epigastric pain and headache since yesterday. Pt believes she is having Wegener's flare up.

## 2016-12-20 NOTE — Discharge Instructions (Signed)
Please follow up with your rheumatologist for further management of your condition.

## 2016-12-23 LAB — PAP, TP IMAGING W/ HPV RNA, RFLX HPV TYPE 16,18/45: HPV DNA HIGH RISK: NOT DETECTED

## 2017-02-13 ENCOUNTER — Encounter: Payer: Self-pay | Admitting: Physician Assistant

## 2017-03-03 ENCOUNTER — Other Ambulatory Visit: Payer: Self-pay | Admitting: Physician Assistant

## 2017-03-03 DIAGNOSIS — M3130 Wegener's granulomatosis without renal involvement: Secondary | ICD-10-CM

## 2017-03-04 MED ORDER — AZATHIOPRINE 50 MG PO TABS: 100.0000 mg | ORAL_TABLET | Freq: Two times a day (BID) | ORAL | 0 refills | Status: AC

## 2017-03-10 ENCOUNTER — Encounter: Payer: Self-pay | Admitting: Physician Assistant

## 2017-03-10 ENCOUNTER — Ambulatory Visit: Payer: Managed Care, Other (non HMO) | Admitting: Physician Assistant

## 2017-03-10 DIAGNOSIS — I10 Essential (primary) hypertension: Secondary | ICD-10-CM | POA: Diagnosis not present

## 2017-03-10 MED ORDER — LISINOPRIL 20 MG PO TABS: 20.0000 mg | ORAL_TABLET | Freq: Every day | ORAL | 1 refills | Status: DC

## 2017-03-10 NOTE — Progress Notes (Signed)
PRIMARY CARE AT [facility] [address] [place] [address] [phone]  Date:  03/10/2017   Name:  Laura [name]   DOB:  [birth year]   MRN:  [id number]  PCP:  Garnetta Buddy, PA    History of Present Illness:  Laura [name] is a 52 y.o. female patient who presents to PCP with  Chief Complaint  Patient presents with  . Follow-up    BP check, med refills / lisinopril     Patient notes that she is doing fine with the blood pressure medication. She is taking compliantly No side effects that she can note. No abnormal chest pains, palpitations, or sob. She is not following any kind of eating restriction at this time.    Patient Active Problem List   Diagnosis Date Noted  . 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 Granulomatosis with polyangiitis (HCC) 08/15/2011  . Hypertension   . Hypothyroidism   . Anxiety   . Mononeuritis lower limb 06/14/2011  . Chronic pain syndrome 06/14/2011    Past Medical History:  Diagnosis Date  . Anxiety    generalized  . Chronic abdominal pain   . Chronic foot pain   . Chronic headache   . Cyst   . Hearing loss   . Hypertension   . Hypothyroidism   . Ischemic bowel disease (HCC)   . Joint pain   . Mononeuritis multiplex   . Nasal bleeding   . Rash   . Unspecified hereditary and idiopathic peripheral neuropathy   . Wegener's granulomatosis (HCC)     Past Surgical History:  Procedure Laterality Date  . TUBAL LIGATION  1992    Social History   Tobacco Use  . Smoking status: Former Smoker    Last attempt to quit: 05/06/2007    Years since quitting: 9.9  . Smokeless tobacco: Never Used  Substance Use Topics  . Alcohol use: No  . Drug use: No    Family History  Problem Relation Age of Onset  . Diabetes Mother   . Hypertension Mother   . Breast cancer Mother   . Diabetes Sister   . Hypertension Sister     No Known Allergies  Medication list has been reviewed and updated.  Current Outpatient  Medications on File Prior to Visit  Medication Sig Dispense Refill  . azaTHIOprine (IMURAN) 50 MG tablet Take 2 tablets (100 mg total) by mouth 2 (two) times daily. 120 tablet 0  . estradiol (VIVELLE-DOT) 0.05 MG/24HR patch Place 1 patch (0.05 mg total) onto the skin 2 (two) times a week. 24 patch 4  . omeprazole (PRILOSEC) 20 MG capsule Take 20 mg by mouth daily.     . progesterone (PROMETRIUM) 200 MG capsule Take daily at bedtime day 1-12 of each month 36 capsule 4  . Tapentadol HCl (NUCYNTA) 75 MG TABS Take 75 mg by mouth 3 (three) times daily as needed (pain).      No current facility-administered medications on file prior to visit.     ROS ROS otherwise unremarkable unless listed above.  Physical Examination: BP (!) 142/78   Pulse 96   Temp 97.9 F (36.6 C) (Oral)   Resp 16   Ht 5\' 4"  (1.626 m)   Wt 181 lb (82.1 kg)   SpO2 98%   BMI 31.07 kg/m  Ideal Body Weight: Weight in (lb) to have BMI = 25: 145.3  Physical Exam  Constitutional: She is oriented to person, place, and time. She appears well-developed  and well-nourished. No distress.  HENT:  Head: Normocephalic and atraumatic.  Right Ear: External ear normal.  Left Ear: External ear normal.  Eyes: Conjunctivae and EOM are normal. Pupils are equal, round, and reactive to light.  Cardiovascular: Normal rate, regular rhythm and normal heart sounds.  No murmur heard. Pulmonary/Chest: Effort normal. No respiratory distress.  Neurological: She is alert and oriented to person, place, and time.  Skin: She is not diaphoretic.  Psychiatric: She has a normal mood and affect. Her behavior is normal.     Assessment and Plan: Laura [name] is a 52 y.o. female who is here today for cc of  Chief Complaint  Patient presents with  . Follow-up    BP check, med refills / lisinopril  bp follow up in 6 months. Discussed lifestyle modifications, and dash diet.  Essential hypertension - Plan: lisinopril (PRINIVIL,ZESTRIL) 20 MG  tablet  Trena PlattStephanie Ladashia Demarinis, PA-C [facility] 2/5/201911:17 AM

## 2017-03-10 NOTE — Patient Instructions (Addendum)
Please follow up with Laura [name] of [place] rheumat[facility] with her concern of COPD.  I will refer you to pulmonology if this is a concern.    DASH Eating Plan DASH stands for "Dietary Approaches to Stop Hypertension." The DASH eating plan is a healthy eating plan that has been shown to reduce high blood pressure (hypertension). It may also reduce your risk for type 2 diabetes, heart disease, and stroke. The DASH eating plan may also help with weight loss. What are tips for following this plan? General guidelines  Avoid eating more than 2,300 mg (milligrams) of salt (sodium) a day. If you have hypertension, you may need to reduce your sodium intake to 1,500 mg a day.  Limit alcohol intake to no more than 1 drink a day for nonpregnant women and 2 drinks a day for men. One drink equals 12 oz of beer, 5 oz of wine, or 1 oz of hard liquor.  Work with your health care provider to maintain a healthy body weight or to lose weight. Ask what an ideal weight is for you.  Get at least 30 minutes of exercise that causes your heart to beat faster (aerobic exercise) most days of the week. Activities may include walking, swimming, or biking.  Work with your health care provider or diet and nutrition specialist (dietitian) to adjust your eating plan to your individual calorie needs. Reading food labels  Check food labels for the amount of sodium per serving. Choose foods with less than 5 percent of the Daily Value of sodium. Generally, foods with less than 300 mg of sodium per serving fit into this eating plan.  To find whole grains, look for the word "whole" as the first word in the ingredient list. Shopping  Buy products labeled as "low-sodium" or "no salt added."  Buy fresh foods. Avoid canned foods and premade or frozen meals. Cooking  Avoid adding salt when cooking. Use salt-free seasonings or herbs instead of table salt or sea salt. Check with your health care provider or pharmacist before using salt  substitutes.  Do not fry foods. Cook foods using healthy methods such as baking, boiling, grilling, and broiling instead.  Cook with heart-healthy oils, such as olive, canola, soybean, or sunflower oil. Meal planning   Eat a balanced diet that includes: ? 5 or more servings of fruits and vegetables each day. At each meal, try to fill half of your plate with fruits and vegetables. ? Up to 6-8 servings of whole grains each day. ? Less than 6 oz of lean meat, poultry, or fish each day. A 3-oz serving of meat is about the same size as a deck of cards. One egg equals 1 oz. ? 2 servings of low-fat dairy each day. ? A serving of nuts, seeds, or beans 5 times each week. ? Heart-healthy fats. Healthy fats called Omega-3 fatty acids are found in foods such as flaxseeds and coldwater fish, like sardines, salmon, and mackerel.  Limit how much you eat of the following: ? Canned or prepackaged foods. ? Food that is high in trans fat, such as fried foods. ? Food that is high in saturated fat, such as fatty meat. ? Sweets, desserts, sugary drinks, and other foods with added sugar. ? Full-fat dairy products.  Do not salt foods before eating.  Try to eat at least 2 vegetarian meals each week.  Eat more home-cooked food and less restaurant, buffet, and fast food.  When eating at a restaurant, ask that your food be  prepared with less salt or no salt, if possible. What foods are recommended? The items listed may not be a complete list. Talk with your dietitian about what dietary choices are best for you. Grains Whole-grain or whole-wheat bread. Whole-grain or whole-wheat pasta. Bitton rice. [place] Morrow. Bulgur. Whole-grain and low-sodium cereals. Pita bread. Low-fat, low-sodium crackers. Whole-wheat flour tortillas. Vegetables Fresh or frozen vegetables (raw, steamed, roasted, or grilled). Low-sodium or reduced-sodium tomato and vegetable juice. Low-sodium or reduced-sodium tomato sauce and tomato  paste. Low-sodium or reduced-sodium canned vegetables. Fruits All fresh, dried, or frozen fruit. Canned fruit in natural juice (without added sugar). Meat and other protein foods Skinless chicken or [place]. Ground chicken or [place]. Pork with fat trimmed off. Fish and seafood. Egg whites. Dried beans, peas, or lentils. Unsalted nuts, nut butters, and seeds. Unsalted canned beans. Lean cuts of beef with fat trimmed off. Low-sodium, lean deli meat. Dairy Low-fat (1%) or fat-free (skim) milk. Fat-free, low-fat, or reduced-fat cheeses. Nonfat, low-sodium ricotta or cottage cheese. Low-fat or nonfat yogurt. Low-fat, low-sodium cheese. Fats and oils Soft margarine without trans fats. Vegetable oil. Low-fat, reduced-fat, or light mayonnaise and salad dressings (reduced-sodium). Canola, safflower, olive, soybean, and sunflower oils. Avocado. Seasoning and other foods Herbs. Spices. Seasoning mixes without salt. Unsalted popcorn and pretzels. Fat-free sweets. What foods are not recommended? The items listed may not be a complete list. Talk with your dietitian about what dietary choices are best for you. Grains Baked goods made with fat, such as croissants, muffins, or some breads. Dry pasta or rice meal packs. Vegetables Creamed or fried vegetables. Vegetables in a cheese sauce. Regular canned vegetables (not low-sodium or reduced-sodium). Regular canned tomato sauce and paste (not low-sodium or reduced-sodium). Regular tomato and vegetable juice (not low-sodium or reduced-sodium). Angie Fava. Olives. Fruits Canned fruit in a light or heavy syrup. Fried fruit. Fruit in cream or butter sauce. Meat and other protein foods Fatty cuts of meat. Ribs. Fried meat. Berniece Salines. Sausage. Bologna and other processed lunch meats. Salami. Fatback. Hotdogs. Bratwurst. Salted nuts and seeds. Canned beans with added salt. Canned or smoked fish. Whole eggs or egg yolks. Chicken or [place] with skin. Dairy Whole or 2% milk,  cream, and half-and-half. Whole or full-fat cream cheese. Whole-fat or sweetened yogurt. Full-fat cheese. Nondairy creamers. Whipped toppings. Processed cheese and cheese spreads. Fats and oils Butter. Stick margarine. Lard. Shortening. Ghee. Bacon fat. Tropical oils, such as coconut, palm kernel, or palm oil. Seasoning and other foods Salted popcorn and pretzels. Onion salt, garlic salt, seasoned salt, table salt, and sea salt. Worcestershire sauce. Tartar sauce. Barbecue sauce. Teriyaki sauce. Soy sauce, including reduced-sodium. Steak sauce. Canned and packaged gravies. Fish sauce. Oyster sauce. Cocktail sauce. Horseradish that you find on the shelf. Ketchup. Mustard. Meat flavorings and tenderizers. Bouillon cubes. Hot sauce and Tabasco sauce. Premade or packaged marinades. Premade or packaged taco seasonings. Relishes. Regular salad dressings. Where to find more information:  National Heart, Lung, and [place]: https://wilson-eaton.com/  American Heart Association: www.heart.org Summary  The DASH eating plan is a healthy eating plan that has been shown to reduce high blood pressure (hypertension). It may also reduce your risk for type 2 diabetes, heart disease, and stroke.  With the DASH eating plan, you should limit salt (sodium) intake to 2,300 mg a day. If you have hypertension, you may need to reduce your sodium intake to 1,500 mg a day.  When on the DASH eating plan, aim to eat more fresh fruits and vegetables, whole grains,  lean proteins, low-fat dairy, and heart-healthy fats.  Work with your health care provider or diet and nutrition specialist (dietitian) to adjust your eating plan to your individual calorie needs. This information is not intended to replace advice given to you by your health care provider. Make sure you discuss any questions you have with your health care provider. Document Released: 01/31/2011 Document Revised: 02/05/2016 Document Reviewed: 02/05/2016 Elsevier  Interactive Patient Education  [employer].

## 2017-03-17 ENCOUNTER — Ambulatory Visit: Payer: Managed Care, Other (non HMO) | Admitting: Physician Assistant

## 2017-05-02 ENCOUNTER — Telehealth: Payer: Self-pay | Admitting: Physician Assistant

## 2017-05-02 NOTE — Telephone Encounter (Signed)
**  MYCHART MESSAGE AND LETTER SENT TO PT TO LET THEM KNOW ENGLISH WILL BE LEAVING THE PRACTICE AND THEY WILL NEED TO MAKE AN APT WITH A DIFFERENT PROVIDER**CC °

## 2017-06-04 ENCOUNTER — Encounter: Payer: Self-pay | Admitting: Physician Assistant

## 2017-09-10 ENCOUNTER — Encounter: Payer: Managed Care, Other (non HMO) | Admitting: Physician Assistant

## 2017-10-19 ENCOUNTER — Encounter (HOSPITAL_COMMUNITY): Payer: Self-pay | Admitting: Emergency Medicine

## 2017-10-19 ENCOUNTER — Emergency Department (HOSPITAL_COMMUNITY)
Admission: EM | Admit: 2017-10-19 | Discharge: 2017-10-19 | Disposition: A | Discharge status: Home / Self Care | Payer: Managed Care, Other (non HMO) | Attending: Emergency Medicine | Admitting: Emergency Medicine

## 2017-10-19 DIAGNOSIS — E039 Hypothyroidism, unspecified: Secondary | ICD-10-CM | POA: Insufficient documentation

## 2017-10-19 DIAGNOSIS — T783XXA Angioneurotic edema, initial encounter: Secondary | ICD-10-CM | POA: Diagnosis not present

## 2017-10-19 DIAGNOSIS — I10 Essential (primary) hypertension: Secondary | ICD-10-CM | POA: Diagnosis not present

## 2017-10-19 DIAGNOSIS — Z79899 Other long term (current) drug therapy: Secondary | ICD-10-CM | POA: Insufficient documentation

## 2017-10-19 DIAGNOSIS — Z87891 Personal history of nicotine dependence: Secondary | ICD-10-CM | POA: Insufficient documentation

## 2017-10-19 HISTORY — DX: Wegener's granulomatosis without renal involvement: M31.30

## 2017-10-19 MED ORDER — FAMOTIDINE IN NACL 20-0.9 MG/50ML-% IV SOLN
20.0000 mg | Freq: Once | INTRAVENOUS | Status: AC
  Administered 2017-10-19: 20 mg via INTRAVENOUS
  Filled 2017-10-19: qty 50

## 2017-10-19 MED ORDER — METHYLPREDNISOLONE SODIUM SUCC 125 MG IJ SOLR
125.0000 mg | Freq: Once | INTRAMUSCULAR | Status: AC
  Administered 2017-10-19: 125 mg via INTRAVENOUS
  Filled 2017-10-19: qty 2

## 2017-10-19 MED ORDER — DIPHENHYDRAMINE HCL 50 MG/ML IJ SOLN
25.0000 mg | Freq: Once | INTRAMUSCULAR | Status: AC
  Administered 2017-10-19: 25 mg via INTRAVENOUS
  Filled 2017-10-19: qty 1

## 2017-10-19 NOTE — ED Triage Notes (Addendum)
Pt states she woke up this am with swelling to R jaw and lower lip, took benadryl this afternoon swelling expanded to upper lip. Pt states she felt like she was having difficulty swallowing earlier today now throat feels "sore". No tongue involvement Pt does take Lisinopril.

## 2017-10-19 NOTE — Discharge Instructions (Signed)
It was our pleasure to provide your ER care today - we hope that you feel better.  See information concerning angioedema.  Stop taking the lisinopril medication, and inform your doctors in future that you developed angioedema when taking that medication. Follow up with primary care doctor in the next couple of days - discuss other blood pressure medication therapy then - call your doctors office in AM tomorrow to discuss.   Return to ER if worse, symptoms recur, trouble breathing, unable to swallow, other concern.

## 2017-10-19 NOTE — ED Provider Notes (Signed)
[name] EMERGENCY DEPARTMENT Provider Note   CSN: [id number] Arrival date & time: 10/19/17  1934     History   Chief Complaint Chief Complaint  Patient presents with  . Angioedema    HPI Laura [name] is a 52 y.o. female.  Patient with hx htn, c/o upper/lower lip swelling onset today, acute onset, persistent, moderate. Felt mild swelling in throat earlier, that is improved. No trouble breathing or swallowing. No hx same. No fam hx angioedema. Is on ace inhibitor medication. No recent change in meds or new meds. No cough or flu symptoms. No sore throat, cough or runny nose. No fever or chills. No rash/itching.   The history is provided by the patient.    Past Medical History:  Diagnosis Date  . Anxiety    generalized  . Chronic abdominal pain   . Chronic foot pain   . Chronic headache   . Cyst   . Hearing loss   . Hypertension   . Hypothyroidism   . Ischemic bowel disease (HCC)   . Joint pain   . Mononeuritis multiplex   . Nasal bleeding   . Rash   . Unspecified hereditary and idiopathic peripheral neuropathy   . Wegener's granulomatosis (HCC)   . Wegener's syndrome [facility])     Patient Active Problem List   Diagnosis Date Noted  . 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 Granulomatosis with polyangiitis (HCC) 08/15/2011  . Hypertension   . Hypothyroidism   . Anxiety   . Mononeuritis lower limb 06/14/2011  . Chronic pain syndrome 06/14/2011    Past Surgical History:  Procedure Laterality Date  . TUBAL LIGATION  1992     OB History    Gravida  2   Para  0   Term  0   Preterm  0   AB  0   Living  2     SAB  0   TAB  0   Ectopic  0   Multiple  0   Live Births  0            Home Medications    Prior to Admission medications   Medication Sig Start Date End Date Taking? Authorizing Provider  azaTHIOprine (IMURAN) 50 MG tablet Take 2 tablets (100 mg total) by mouth 2 (two) times daily. 03/04/17    Trena Platt D, PA  estradiol (VIVELLE-DOT) 0.05 MG/24HR patch Place 1 patch (0.05 mg total) onto the skin 2 (two) times a week. 12/19/16   Harrington Challenger, NP  lisinopril (PRINIVIL,ZESTRIL) 20 MG tablet Take 1 tablet (20 mg total) by mouth daily. 03/10/17   Trena Platt D, PA  omeprazole (PRILOSEC) 20 MG capsule Take 20 mg by mouth daily.  09/20/09   [provider]  progesterone (PROMETRIUM) 200 MG capsule Take daily at bedtime day 1-12 of each month 12/18/16   Harrington Challenger, NP  Tapentadol HCl (NUCYNTA) 75 MG TABS Take 75 mg by mouth 3 (three) times daily as needed (pain).     [provider]    Family History Family History  Problem Relation Age of Onset  . Diabetes Mother   . Hypertension Mother   . Breast cancer Mother   . Diabetes Sister   . Hypertension Sister     Social History Social History   Tobacco Use  . Smoking status: Former Smoker    Last attempt to quit: 05/06/2007    Years since quitting: 10.4  . Smokeless  tobacco: Never Used  Substance Use Topics  . Alcohol use: No  . Drug use: No     Allergies   Patient has no known allergies.   Review of Systems Review of Systems  Constitutional: Negative for fever.  HENT: Negative for sore throat.   Eyes: Negative for redness.  Respiratory: Negative for shortness of breath.   Cardiovascular: Negative for chest pain.  Gastrointestinal: Negative for abdominal pain and vomiting.  Genitourinary: Negative for flank pain.  Musculoskeletal: Negative for neck pain.  Skin: Negative for rash.  Neurological: Negative for headaches.  Hematological: Does not bruise/bleed easily.  Psychiatric/Behavioral: Negative for confusion.     Physical Exam Updated Vital Signs BP (!) 145/100 (BP Location: Right Arm)   Pulse 87   Temp 98.2 F (36.8 C) (Oral)   Resp 16   Ht 1.549 m (5\' 1" )   Wt 81.6 kg   SpO2 100%   BMI 34.01 kg/m   Physical Exam  Constitutional: She appears well-developed and  well-nourished.  HENT:  Mouth/Throat: Oropharynx is clear and moist.  Moderate lip swelling. Tongue soft, not significantly swollen. Posterior pharynx normal.   Eyes: Pupils are equal, round, and reactive to light. Conjunctivae are normal. No scleral icterus.  Neck: Neck supple. No tracheal deviation present.  No neck mass/swelling.   Cardiovascular: Normal rate, regular rhythm, normal heart sounds and intact distal pulses. Exam reveals no gallop and no friction rub.  No murmur heard. Pulmonary/Chest: Effort normal and breath sounds normal. No respiratory distress.  Abdominal: Soft. Normal appearance and bowel sounds are normal. She exhibits no distension. There is no tenderness.  Musculoskeletal: She exhibits no edema.  Neurological: She is alert.  Skin: Skin is warm and dry. No rash noted.  Psychiatric: She has a normal mood and affect.  Nursing note and vitals reviewed.    ED Treatments / Results  Labs (all labs ordered are listed, but only abnormal results are displayed) Labs Reviewed - No data to display  EKG None  Radiology No results found.  Procedures Procedures (including critical care time)  Medications Ordered in ED Medications - No data to display   Initial Impression / Assessment and Plan / ED Course  I have reviewed the triage vital signs and the nursing notes.  Pertinent labs & imaging results that were available during my care of the patient were reviewed by me and considered in my medical decision making (see chart for details).  Iv ns.   Benadryl 25 mg iv (pt took another benadryl prior to arrival). pepcid iv. Solumedrol iv.   Reviewed nursing notes and prior charts for additional history.   Recheck, swelling is improved from prior. No trouble breathing or swallowing.  Pt currently appears stable for d/c.     Final Clinical Impressions(s) / ED Diagnoses   Final diagnoses:  None    ED Discharge Orders    None       Cathren LaineSteinl, Aubree Doody,  MD 10/19/17 2150

## 2017-10-19 NOTE — ED Notes (Signed)
Pt reports waking up this morning at 0430, getting ready for work, and noticing that her face and lips were swollen. Pt denies any allergies but does take lisinopril for her HTN.

## 2018-01-07 ENCOUNTER — Other Ambulatory Visit: Payer: Self-pay | Admitting: Women's Health

## 2018-01-07 DIAGNOSIS — Z79890 Hormone replacement therapy (postmenopausal): Secondary | ICD-10-CM

## 2018-02-12 ENCOUNTER — Other Ambulatory Visit: Payer: Self-pay | Admitting: Women's Health

## 2018-02-12 DIAGNOSIS — Z79890 Hormone replacement therapy (postmenopausal): Secondary | ICD-10-CM

## 2018-04-06 ENCOUNTER — Encounter: Payer: Self-pay | Admitting: Women's Health

## 2018-04-06 ENCOUNTER — Ambulatory Visit (INDEPENDENT_AMBULATORY_CARE_PROVIDER_SITE_OTHER): Payer: Managed Care, Other (non HMO) | Admitting: Women's Health

## 2018-04-06 VITALS — BP 134/80 | Ht 61.0 in | Wt 176.0 lb

## 2018-04-06 DIAGNOSIS — Z01419 Encounter for gynecological examination (general) (routine) without abnormal findings: Secondary | ICD-10-CM

## 2018-04-06 DIAGNOSIS — Z79890 Hormone replacement therapy (postmenopausal): Secondary | ICD-10-CM | POA: Diagnosis not present

## 2018-04-06 MED ORDER — ESTRADIOL 0.05 MG/24HR TD PTTW: 1.0000 | MEDICATED_PATCH | TRANSDERMAL | 4 refills | Status: DC

## 2018-04-06 MED ORDER — PROGESTERONE MICRONIZED 200 MG PO CAPS: ORAL_CAPSULE | ORAL | 4 refills | Status: DC

## 2018-04-06 NOTE — Patient Instructions (Signed)
Carbohydrate Counting for Diabetes Mellitus, Adult  Carbohydrate counting is a method of keeping track of how many carbohydrates you eat. Eating carbohydrates naturally increases the amount of sugar (glucose) in the blood. Counting how many carbohydrates you eat helps keep your blood glucose within normal limits, which helps you manage your diabetes (diabetes mellitus). It is important to know how many carbohydrates you can safely have in each meal. This is different for every person. A diet and nutrition specialist (registered dietitian) can help you make a meal plan and calculate how many carbohydrates you should have at each meal and snack. Carbohydrates are found in the following foods:  Grains, such as breads and cereals.  Dried beans and soy products.  Starchy vegetables, such as potatoes, peas, and corn.  Fruit and fruit juices.  Milk and yogurt.  Sweets and snack foods, such as cake, cookies, candy, chips, and soft drinks. How do I count carbohydrates? There are two ways to count carbohydrates in food. You can use either of the methods or a combination of both. Reading "Nutrition Facts" on packaged food The "Nutrition Facts" list is included on the labels of almost all packaged foods and beverages in the [place] It includes:  The serving size.  Information about nutrients in each serving, including the grams (g) of carbohydrate per serving. To use the "Nutrition Facts":  Decide how many servings you will have.  Multiply the number of servings by the number of carbohydrates per serving.  The resulting number is the total amount of carbohydrates that you will be having. Learning standard serving sizes of other foods When you eat carbohydrate foods that are not packaged or do not include "Nutrition Facts" on the label, you need to measure the servings in order to count the amount of carbohydrates:  Measure the foods that you will eat with a food scale or measuring cup, if  needed.  Decide how many standard-size servings you will eat.  Multiply the number of servings by 15. Most carbohydrate-rich foods have about 15 g of carbohydrates per serving. ? For example, if you eat 8 oz (170 g) of strawberries, you will have eaten 2 servings and 30 g of carbohydrates (2 servings x 15 g = 30 g).  For foods that have more than one food mixed, such as soups and casseroles, you must count the carbohydrates in each food that is included. The following list contains standard serving sizes of common carbohydrate-rich foods. Each of these servings has about 15 g of carbohydrates:   hamburger bun or  English muffin.   oz (15 mL) syrup.   oz (14 g) jelly.  1 slice of bread.  1 six-inch tortilla.  3 oz (85 g) cooked rice or pasta.  4 oz (113 g) cooked dried beans.  4 oz (113 g) starchy vegetable, such as peas, corn, or potatoes.  4 oz (113 g) hot cereal.  4 oz (113 g) mashed potatoes or  of a large baked potato.  4 oz (113 g) canned or frozen fruit.  4 oz (120 mL) fruit juice.  4-6 crackers.  6 chicken nuggets.  6 oz (170 g) unsweetened dry cereal.  6 oz (170 g) plain fat-free yogurt or yogurt sweetened with artificial sweeteners.  8 oz (240 mL) milk.  8 oz (170 g) fresh fruit or one small piece of fruit.  24 oz (680 g) popped popcorn. Example of carbohydrate counting Sample meal  3 oz (85 g) chicken breast.  6 oz (170 g)  brown rice.  4 oz (113 g) corn.  8 oz (240 mL) milk.  8 oz (170 g) strawberries with sugar-free whipped topping. Carbohydrate calculation 1. Identify the foods that contain carbohydrates: ? Rice. ? Corn. ? Milk. ? Strawberries. 2. Calculate how many servings you have of each food: ? 2 servings rice. ? 1 serving corn. ? 1 serving milk. ? 1 serving strawberries. 3. Multiply each number of servings by 15 g: ? 2 servings rice x 15 g = 30 g. ? 1 serving corn x 15 g = 15 g. ? 1 serving milk x 15 g = 15 g. ? 1  serving strawberries x 15 g = 15 g. 4. Add together all of the amounts to find the total grams of carbohydrates eaten: ? 30 g + 15 g + 15 g + 15 g = 75 g of carbohydrates total. Summary  Carbohydrate counting is a method of keeping track of how many carbohydrates you eat.  Eating carbohydrates naturally increases the amount of sugar (glucose) in the blood.  Counting how many carbohydrates you eat helps keep your blood glucose within normal limits, which helps you manage your diabetes.  A diet and nutrition specialist (registered dietitian) can help you make a meal plan and calculate how many carbohydrates you should have at each meal and snack. This information is not intended to replace advice given to you by your health care provider. Make sure you discuss any questions you have with your health care provider. Document Released: 02/11/2005 Document Revised: 08/21/2016 Document Reviewed: 07/26/2015 Elsevier Interactive Patient Education  2019 [place] Maintenance for Postmenopausal Women Menopause is a normal process in which your reproductive ability comes to an end. This process happens gradually over a span of months to years, usually between the ages of 60 and 61. Menopause is complete when you have missed 12 consecutive menstrual periods. It is important to talk with your health care provider about some of the most common conditions that affect postmenopausal women, such as heart disease, cancer, and bone loss (osteoporosis). Adopting a healthy lifestyle and getting preventive care can help to promote your health and wellness. Those actions can also lower your chances of developing some of these common conditions. What should I know about menopause? During menopause, you may experience a number of symptoms, such as:  Moderate-to-severe hot flashes.  Night sweats.  Decrease in sex drive.  Mood swings.  Headaches.  Tiredness.  Irritability.  Memory  problems.  Insomnia. Choosing to treat or not to treat menopausal changes is an individual decision that you make with your health care provider. What should I know about hormone replacement therapy and supplements? Hormone therapy products are effective for treating symptoms that are associated with menopause, such as hot flashes and night sweats. Hormone replacement carries certain risks, especially as you become older. If you are thinking about using estrogen or estrogen with progestin treatments, discuss the benefits and risks with your health care provider. What should I know about heart disease and stroke? Heart disease, heart attack, and stroke become more likely as you age. This may be due, in part, to the hormonal changes that your body experiences during menopause. These can affect how your body processes dietary fats, triglycerides, and cholesterol. Heart attack and stroke are both medical emergencies. There are many things that you can do to help prevent heart disease and stroke:  Have your blood pressure checked at least every 1-2 years. High blood pressure causes heart disease and  increases the risk of stroke.  If you are 67-52 years old, ask your health care provider if you should take aspirin to prevent a heart attack or a stroke.  Do not use any tobacco products, including cigarettes, chewing tobacco, or electronic cigarettes. If you need help quitting, ask your health care provider.  It is important to eat a healthy diet and maintain a healthy weight. ? Be sure to include plenty of vegetables, fruits, low-fat dairy products, and lean protein. ? Avoid eating foods that are high in solid fats, added sugars, or salt (sodium).  Get regular exercise. This is one of the most important things that you can do for your health. ? Try to exercise for at least 150 minutes each week. The type of exercise that you do should increase your heart rate and make you sweat. This is known as  moderate-intensity exercise. ? Try to do strengthening exercises at least twice each week. Do these in addition to the moderate-intensity exercise.  Know your numbers.Ask your health care provider to check your cholesterol and your blood glucose. Continue to have your blood tested as directed by your health care provider.  What should I know about cancer screening? There are several types of cancer. Take the following steps to reduce your risk and to catch any cancer development as early as possible. Breast Cancer  Practice breast self-awareness. ? This means understanding how your breasts normally appear and feel. ? It also means doing regular breast self-exams. Let your health care provider know about any changes, no matter how small.  If you are 46 or older, have a clinician do a breast exam (clinical breast exam or CBE) every year. Depending on your age, family history, and medical history, it may be recommended that you also have a yearly breast X-ray (mammogram).  If you have a family history of breast cancer, talk with your health care provider about genetic screening.  If you are at high risk for breast cancer, talk with your health care provider about having an MRI and a mammogram every year.  Breast cancer (BRCA) gene test is recommended for women who have family members with BRCA-related cancers. Results of the assessment will determine the need for genetic counseling and BRCA1 and for BRCA2 testing. BRCA-related cancers include these types: ? Breast. This occurs in males or females. ? Ovarian. ? Tubal. This may also be called fallopian tube cancer. ? Cancer of the abdominal or pelvic lining (peritoneal cancer). ? Prostate. ? Pancreatic. Cervical, Uterine, and Ovarian Cancer Your health care provider may recommend that you be screened regularly for cancer of the pelvic organs. These include your ovaries, uterus, and vagina. This screening involves a pelvic exam, which includes  checking for microscopic changes to the surface of your cervix (Pap test).  For women ages 21-65, health care providers may recommend a pelvic exam and a Pap test every three years. For women ages 65-65, they may recommend the Pap test and pelvic exam, combined with testing for human papilloma virus (HPV), every five years. Some types of HPV increase your risk of cervical cancer. Testing for HPV may also be done on women of any age who have unclear Pap test results.  Other health care providers may not recommend any screening for nonpregnant women who are considered low risk for pelvic cancer and have no symptoms. Ask your health care provider if a screening pelvic exam is right for you.  If you have had past treatment for cervical cancer  or a condition that could lead to cancer, you need Pap tests and screening for cancer for at least 20 years after your treatment. If Pap tests have been discontinued for you, your risk factors (such as having a new sexual partner) need to be reassessed to determine if you should start having screenings again. Some women have medical problems that increase the chance of getting cervical cancer. In these cases, your health care provider may recommend that you have screening and Pap tests more often.  If you have a family history of uterine cancer or ovarian cancer, talk with your health care provider about genetic screening.  If you have vaginal bleeding after reaching menopause, tell your health care provider.  There are currently no reliable tests available to screen for ovarian cancer. Lung Cancer Lung cancer screening is recommended for adults 35-66 years old who are at high risk for lung cancer because of a history of smoking. A yearly low-dose CT scan of the lungs is recommended if you:  Currently smoke.  Have a history of at least 30 pack-years of smoking and you currently smoke or have quit within the past 15 years. A pack-year is smoking an average of one  pack of cigarettes per day for one year. Yearly screening should:  Continue until it has been 15 years since you quit.  Stop if you develop a health problem that would prevent you from having lung cancer treatment. Colorectal Cancer  This type of cancer can be detected and can often be prevented.  Routine colorectal cancer screening usually begins at age 43 and continues through age 50.  If you have risk factors for colon cancer, your health care provider may recommend that you be screened at an earlier age.  If you have a family history of colorectal cancer, talk with your health care provider about genetic screening.  Your health care provider may also recommend using home test kits to check for hidden blood in your stool.  A small camera at the end of a tube can be used to examine your colon directly (sigmoidoscopy or colonoscopy). This is done to check for the earliest forms of colorectal cancer.  Direct examination of the colon should be repeated every 5-10 years until age 3. However, if early forms of precancerous polyps or small growths are found or if you have a family history or genetic risk for colorectal cancer, you may need to be screened more often. Skin Cancer  Check your skin from head to toe regularly.  Monitor any moles. Be sure to tell your health care provider: ? About any new moles or changes in moles, especially if there is a change in a mole's shape or color. ? If you have a mole that is larger than the size of a pencil eraser.  If any of your family members has a history of skin cancer, especially at a  age, talk with your health care provider about genetic screening.  Always use sunscreen. Apply sunscreen liberally and repeatedly throughout the day.  Whenever you are outside, protect yourself by wearing long sleeves, pants, a wide-brimmed hat, and sunglasses. What should I know about osteoporosis? Osteoporosis is a condition in which bone destruction  happens more quickly than new bone creation. After menopause, you may be at an increased risk for osteoporosis. To help prevent osteoporosis or the bone fractures that can happen because of osteoporosis, the following is recommended:  If you are 53-55 years old, get at least 1,000 mg of  calcium and at least 600 mg of vitamin D per day.  If you are older than age 74 but younger than age 65, get at least 1,200 mg of calcium and at least 600 mg of vitamin D per day.  If you are older than age 30, get at least 1,200 mg of calcium and at least 800 mg of vitamin D per day. Smoking and excessive alcohol intake increase the risk of osteoporosis. Eat foods that are rich in calcium and vitamin D, and do weight-bearing exercises several times each week as directed by your health care provider. What should I know about how menopause affects my mental health? Depression may occur at any age, but it is more common as you become older. Common symptoms of depression include:  Low or sad mood.  Changes in sleep patterns.  Changes in appetite or eating patterns.  Feeling an overall lack of motivation or enjoyment of activities that you previously enjoyed.  Frequent crying spells. Talk with your health care provider if you think that you are experiencing depression. What should I know about immunizations? It is important that you get and maintain your immunizations. These include:  Tetanus, diphtheria, and pertussis (Tdap) booster vaccine.  Influenza every year before the flu season begins.  Pneumonia vaccine.  Shingles vaccine. Your health care provider may also recommend other immunizations. This information is not intended to replace advice given to you by your health care provider. Make sure you discuss any questions you have with your health care provider. Document Released: 04/05/2005 Document Revised: 09/01/2015 Document Reviewed: 11/15/2014 Elsevier Interactive Patient Education  2019  [employer].

## 2018-04-06 NOTE — Progress Notes (Signed)
Laura [name] September 22, 1965 [id number]    History:    Presents for annual exam.  Postmenopausal on  HRT with no bleeding/BTL.  Normal Pap and mammogram history.  2011- colonoscopy has repeat scheduled.  History of Wegener's disease.  Primary care manages hypertension.  Past medical history, past surgical history, family history and social history were all reviewed and documented in the EPIC chart.  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  2 children ages 43 and 53 doing well.  ROS:  A ROS was performed and pertinent positives and negatives are included.  Exam:  Vitals:   04/06/18 1441  BP: 134/80  Weight: 176 lb (79.8 kg)  Height: 5\' 1"  (1.549 m)   Body mass index is 33.25 kg/m.   General appearance:  Normal Thyroid:  Symmetrical, normal in size, without palpable masses or nodularity. Respiratory  Auscultation:  Clear without wheezing or rhonchi Cardiovascular  Auscultation:  Regular rate, without rubs, murmurs or gallops  Edema/varicosities:  Not grossly evident Abdominal  Soft,nontender, without masses, guarding or rebound.  Liver/spleen:  No organomegaly noted  Hernia:  None appreciated  Skin  Inspection:  Grossly normal   Breasts: Examined lying and sitting.     Right: Without masses, retractions, discharge or axillary adenopathy.     Left: Without masses, retractions, discharge or axillary adenopathy. Gentitourinary   Inguinal/mons:  Normal without inguinal adenopathy  External genitalia:  Normal  BUS/Urethra/Skene's glands:  Normal  Vagina:  Normal  Cervix:  Normal  Uterus:  normal in size, shape and contour.  Midline and mobile  Adnexa/parametria:     Rt: Without masses or tenderness.   Lt: Without masses or tenderness.  Anus and perineum: Normal  Digital rectal exam: Normal sphincter tone without palpated masses or tenderness  Assessment/Plan:  53 y.o. MBF G3, P2 for annual exam with no complaints.  Postmenopausal on HRT with good relief of menopausal  symptoms Hypertension, Wegener's primary care manages labs and meds Obesity  Plan: Vivelle dot 0.05 patch twice weekly prescription, proper use given and reviewed slight risk for blood clots and strokes.  Continue Prometrium 200 mg day 1 through 12 at bedtime.  SBEs, continue annual screening mammogram is overdue has scheduled.  Home safety, fall prevention and importance of weightbearing and balance type exercise reviewed and encouraged.  Decrease calorie/carbs.  Pap normal 11/2016 new screening guidelines reviewed.    Harrington Challenger [facility], 5:17 PM 04/06/2018

## 2018-04-07 LAB — URINALYSIS, COMPLETE W/RFL CULTURE
BILIRUBIN URINE: NEGATIVE
Bacteria, UA: NONE SEEN /HPF
Glucose, UA: NEGATIVE
HGB URINE DIPSTICK: NEGATIVE
Hyaline Cast: NONE SEEN /LPF
KETONES UR: NEGATIVE
LEUKOCYTE ESTERASE: NEGATIVE
NITRITES URINE, INITIAL: NEGATIVE
RBC / HPF: NONE SEEN /HPF (ref 0–2)
Specific Gravity, Urine: 1.015 (ref 1.001–1.03)
WBC UA: NONE SEEN /HPF (ref 0–5)
pH: 6 (ref 5.0–8.0)

## 2018-04-07 LAB — NO CULTURE INDICATED

## 2019-02-16 ENCOUNTER — Other Ambulatory Visit: Payer: Self-pay

## 2019-02-17 ENCOUNTER — Encounter: Payer: Self-pay | Admitting: Women's Health

## 2019-02-17 ENCOUNTER — Ambulatory Visit (INDEPENDENT_AMBULATORY_CARE_PROVIDER_SITE_OTHER): Payer: Managed Care, Other (non HMO) | Admitting: Women's Health

## 2019-02-17 VITALS — BP 130/90

## 2019-02-17 DIAGNOSIS — N938 Other specified abnormal uterine and vaginal bleeding: Secondary | ICD-10-CM

## 2019-02-17 MED ORDER — MEGESTROL ACETATE 40 MG PO TABS: 40.0000 mg | ORAL_TABLET | Freq: Two times a day (BID) | ORAL | 0 refills | Status: DC

## 2019-02-17 MED ORDER — PROGESTERONE MICRONIZED 100 MG PO CAPS: 100.0000 mg | ORAL_CAPSULE | Freq: Every day | ORAL | 4 refills | Status: DC

## 2019-02-17 NOTE — Progress Notes (Signed)
53 year old MBF G2 P2 presents with 2 weeks of red light spotting.  Postmenopausal on cyclic HRT for several  years with no bleeding with good relief of menopausal symptoms.  BTL.  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  Denies urinary symptoms, vaginal discharge with itching or odor, back/abdominal pain or fever.  Reports good home life.  Has lost 25 pounds in the past 6 months with diet and exercise.  Medical problems include hypertension.  History of Wegener's disease.  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  Exam: Appears well.  No CVAT.  External genitalia within normal limits, speculum exam scant menses type blood noted, no odor, erythema.  Bimanual no CMT or adnexal tenderness.  Postmenopausal bleeding on cyclic HRT  Plan: Reviewed can be common to have a withdrawal bleed with cyclic HRT, will continue Vivelle patch twice weekly, Megace 40 mg p.o. twice daily first 10 days, do not take Prometrium first 12 days of next month/January resume in February taking Prometrium 100 mg p.o. daily at bedtime.  Instructed to call if continued problems with spotting/bleeding will proceed with ultrasound.  Congratulated on weight loss.

## 2019-02-17 NOTE — Patient Instructions (Signed)
Postmenopausal Bleeding  Postmenopausal bleeding is any bleeding that occurs after menopause. Menopause is when a woman's period stops. Any type of bleeding after menopause should be checked by your doctor. Treatment will depend on the cause. Follow these instructions at home:  Pay attention to any changes in your symptoms.  Avoid using tampons and douches as told by your doctor.  Change your pads regularly.  Get regular pelvic exams and Pap tests.  Take iron pills as told by your doctor.  Take over-the-counter and prescription medicines only as told by your doctor.  Keep all follow-up visits as told by your doctor. This is important. Contact a doctor if:  Your bleeding lasts for more than 1 week.  You have pain in your belly (abdomen).  You have bleeding during or after sex.  You have bleeding that happens more often than every 3 weeks. Get help right away if:  You have fever, chills, headache, dizziness, muscle aches, or bleeding.  You have very bad pain with bleeding.  You have clumps of blood (blood clots) coming from your vagina.  You have a lot of bleeding, and: ? You use more than 1 pad an hour. ? This kind of bleeding has never happened before.  You feel like you are going to pass out (faint). Summary  Any type of bleeding after menopause should be checked by your doctor.  Pay attention to any changes in your symptoms.  Keep all follow-up visits as told by your doctor. This information is not intended to replace advice given to you by your health care provider. Make sure you discuss any questions you have with your health care provider. Document Released: 11/21/2007 Document Revised: 04/30/2018 Document Reviewed: 03/19/2016 Elsevier Patient Education  2020 [employer]  

## 2019-05-17 ENCOUNTER — Other Ambulatory Visit: Payer: Self-pay

## 2019-05-17 ENCOUNTER — Ambulatory Visit (INDEPENDENT_AMBULATORY_CARE_PROVIDER_SITE_OTHER): Payer: Managed Care, Other (non HMO) | Admitting: Women's Health

## 2019-05-17 ENCOUNTER — Encounter: Payer: Self-pay | Admitting: Women's Health

## 2019-05-17 DIAGNOSIS — Z01419 Encounter for gynecological examination (general) (routine) without abnormal findings: Secondary | ICD-10-CM

## 2019-05-17 DIAGNOSIS — Z79890 Hormone replacement therapy (postmenopausal): Secondary | ICD-10-CM | POA: Diagnosis not present

## 2019-05-17 MED ORDER — PROGESTERONE MICRONIZED 100 MG PO CAPS: 100.0000 mg | ORAL_CAPSULE | Freq: Every day | ORAL | 4 refills | Status: DC

## 2019-05-17 MED ORDER — ESTRADIOL 0.05 MG/24HR TD PTTW: 1.0000 | MEDICATED_PATCH | TRANSDERMAL | 4 refills | Status: DC

## 2019-05-17 NOTE — Patient Instructions (Signed)
[facility] [phone] Vit D 2000 iu daily Health Maintenance for Postmenopausal Women Menopause is a normal process in which your ability to get pregnant comes to an end. This process happens slowly over many months or years, usually between the ages of 8 and 64. Menopause is complete when you have missed your menstrual periods for 12 months. It is important to talk with your health care provider about some of the most common conditions that affect women after menopause (postmenopausal women). These include heart disease, cancer, and bone loss (osteoporosis). Adopting a healthy lifestyle and getting preventive care can help to promote your health and wellness. The actions you take can also lower your chances of developing some of these common conditions. What should I know about menopause? During menopause, you may get a number of symptoms, such as:  Hot flashes. These can be moderate or severe.  Night sweats.  Decrease in sex drive.  Mood swings.  Headaches.  Tiredness.  Irritability.  Memory problems.  Insomnia. Choosing to treat or not to treat these symptoms is a decision that you make with your health care provider. Do I need hormone replacement therapy?  Hormone replacement therapy is effective in treating symptoms that are caused by menopause, such as hot flashes and night sweats.  Hormone replacement carries certain risks, especially as you become older. If you are thinking about using estrogen or estrogen with progestin, discuss the benefits and risks with your health care provider. What is my risk for heart disease and stroke? The risk of heart disease, heart attack, and stroke increases as you age. One of the causes may be a change in the body's hormones during menopause. This can affect how your body uses dietary fats, triglycerides, and cholesterol. Heart attack and stroke are medical emergencies. There are many things that you can do to help prevent heart disease and  stroke. Watch your blood pressure  High blood pressure causes heart disease and increases the risk of stroke. This is more likely to develop in people who have high blood pressure readings, are of African descent, or are overweight.  Have your blood pressure checked: ? Every 3-5 years if you are 60-26 years of age. ? Every year if you are 74 years old or older. Eat a healthy diet   Eat a diet that includes plenty of vegetables, fruits, low-fat dairy products, and lean protein.  Do not eat a lot of foods that are high in solid fats, added sugars, or sodium. Get regular exercise Get regular exercise. This is one of the most important things you can do for your health. Most adults should:  Try to exercise for at least 150 minutes each week. The exercise should increase your heart rate and make you sweat (moderate-intensity exercise).  Try to do strengthening exercises at least twice each week. Do these in addition to the moderate-intensity exercise.  Spend less time sitting. Even light physical activity can be beneficial. Other tips  Work with your health care provider to achieve or maintain a healthy weight.  Do not use any products that contain nicotine or tobacco, such as cigarettes, e-cigarettes, and chewing tobacco. If you need help quitting, ask your health care provider.  Know your numbers. Ask your health care provider to check your cholesterol and your blood sugar (glucose). Continue to have your blood tested as directed by your health care provider. Do I need screening for cancer? Depending on your health history and family history, you may need to have cancer  screening at different stages of your life. This may include screening for:  Breast cancer.  Cervical cancer.  Lung cancer.  Colorectal cancer. What is my risk for osteoporosis? After menopause, you may be at increased risk for osteoporosis. Osteoporosis is a condition in which bone destruction happens more  quickly than new bone creation. To help prevent osteoporosis or the bone fractures that can happen because of osteoporosis, you may take the following actions:  If you are 43-36 years old, get at least 1,000 mg of calcium and at least 600 mg of vitamin D per day.  If you are older than age 5 but [place] than age 47, get at least 1,200 mg of calcium and at least 600 mg of vitamin D per day.  If you are older than age 32, get at least 1,200 mg of calcium and at least 800 mg of vitamin D per day. Smoking and drinking excessive alcohol increase the risk of osteoporosis. Eat foods that are rich in calcium and vitamin D, and do weight-bearing exercises several times each week as directed by your health care provider. How does menopause affect my mental health? Depression may occur at any age, but it is more common as you become older. Common symptoms of depression include:  Low or sad mood.  Changes in sleep patterns.  Changes in appetite or eating patterns.  Feeling an overall lack of motivation or enjoyment of activities that you previously enjoyed.  Frequent crying spells. Talk with your health care provider if you think that you are experiencing depression. General instructions See your health care provider for regular wellness exams and vaccines. This may include:  Scheduling regular health, dental, and eye exams.  Getting and maintaining your vaccines. These include: ? Influenza vaccine. Get this vaccine each year before the flu season begins. ? Pneumonia vaccine. ? Shingles vaccine. ? Tetanus, diphtheria, and pertussis (Tdap) booster vaccine. Your health care provider may also recommend other immunizations. Tell your health care provider if you have ever been abused or do not feel safe at home. Summary  Menopause is a normal process in which your ability to get pregnant comes to an end.  This condition causes hot flashes, night sweats, decreased interest in sex, mood swings,  headaches, or lack of sleep.  Treatment for this condition may include hormone replacement therapy.  Take actions to keep yourself healthy, including exercising regularly, eating a healthy diet, watching your weight, and checking your blood pressure and blood sugar levels.  Get screened for cancer and depression. Make sure that you are up to date with all your vaccines. This information is not intended to replace advice given to you by your health care provider. Make sure you discuss any questions you have with your health care provider. Document Revised: 02/04/2018 Document Reviewed: 02/04/2018 Elsevier Patient Education  2020 [employer].

## 2019-05-17 NOTE — Progress Notes (Signed)
Laura [name] [birth year] [id number]    History:    Presents for annual exam.  Postmenopausal on HRT with good symptom relief, no bleeding/BTL.  Normal Pap and mammogram history.  Hypertension, Wegener's disease and IBS.  2011 - colonoscopy.    Past medical history, past surgical history, family history and social history were all reviewed and documented in the EPIC chart.  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  Mother dementia.  2 children ages 53 and 58 both doing well.  ROS:  A ROS was performed and pertinent positives and negatives are included.  Exam:  Vitals:   05/17/19 1600  BP: (!) 150/80  Weight: 181 lb (82.1 kg)  Height: 5\' 1"  (1.549 m)   Body mass index is 34.2 kg/m.   General appearance:  Normal Thyroid:  Symmetrical, normal in size, without palpable masses or nodularity. Respiratory  Auscultation:  Clear without wheezing or rhonchi Cardiovascular  Auscultation:  Regular rate, without rubs, murmurs or gallops  Edema/varicosities:  Not grossly evident Abdominal  Soft,nontender, without masses, guarding or rebound.  Liver/spleen:  No organomegaly noted  Hernia:  None appreciated  Skin  Inspection:  Grossly normal   Breasts: Examined lying and sitting.     Right: Without masses, retractions, discharge or axillary adenopathy.     Left: Without masses, retractions, discharge or axillary adenopathy. Gentitourinary   Inguinal/mons:  Normal without inguinal adenopathy  External genitalia:  Normal  BUS/Urethra/Skene's glands:  Normal  Vagina:  Normal  Cervix:  Normal  Uterus:  normal in size, shape and contour.  Midline and mobile  Adnexa/parametria:     Rt: Without masses or tenderness.   Lt: Without masses or tenderness.  Anus and perineum: Normal  Digital rectal exam: Normal sphincter tone without palpated masses or tenderness  Assessment/Plan:  54 y.o. M BF G2 P2 for annual exam with no complaints.  Postmenopausal on HRT with no bleeding Hypertension,  Wegener's-primary care manages labs and meds Obesity 2011 - colonoscopy history of IBS  Plan: Vivelle patch 0.05 patch twice weekly prescription, proper use given slight risk for blood clots and strokes and breast cancer reviewed.  Prometrium 100 mg p.o. at bedtime daily prescription, proper use given and reviewed.  Repeat colonoscopy due instructed to schedule.2012  SBEs, overdue for mammogram reviewed importance of annual screening mammogram instructed to schedule.  Increase regular cardio and balance type exercise, vitamin D 2000 IUs daily, calcium rich foods encouraged.  Aware blood pressure elevated if persists greater than 130/80 will follow up with primary care.  Pap normal 11/2016, new screening guidelines reviewed.       12/2016 [facility], 5:19 PM 05/17/2019

## 2020-07-05 ENCOUNTER — Other Ambulatory Visit: Payer: Self-pay

## 2020-11-16 ENCOUNTER — Ambulatory Visit (INDEPENDENT_AMBULATORY_CARE_PROVIDER_SITE_OTHER): Payer: Managed Care, Other (non HMO) | Admitting: Nurse Practitioner

## 2020-11-16 ENCOUNTER — Other Ambulatory Visit: Payer: Self-pay

## 2020-11-16 ENCOUNTER — Encounter: Payer: Self-pay | Admitting: Nurse Practitioner

## 2020-11-16 VITALS — BP 124/78 | Ht 62.0 in | Wt 191.0 lb

## 2020-11-16 DIAGNOSIS — Z01419 Encounter for gynecological examination (general) (routine) without abnormal findings: Secondary | ICD-10-CM | POA: Diagnosis not present

## 2020-11-16 DIAGNOSIS — Z79890 Hormone replacement therapy (postmenopausal): Secondary | ICD-10-CM

## 2020-11-16 MED ORDER — PROGESTERONE MICRONIZED 100 MG PO CAPS: 100.0000 mg | ORAL_CAPSULE | Freq: Every day | ORAL | 4 refills | Status: DC

## 2020-11-16 MED ORDER — PROGESTERONE MICRONIZED 100 MG PO CAPS: 100.0000 mg | ORAL_CAPSULE | Freq: Every day | ORAL | 3 refills | Status: DC

## 2020-11-16 MED ORDER — ESTRADIOL 0.05 MG/24HR TD PTTW: 1.0000 | MEDICATED_PATCH | TRANSDERMAL | 4 refills | Status: DC

## 2020-11-16 NOTE — Progress Notes (Signed)
Laura [name] Jun 03, 1965 [id number]   History:  55 y.o. G2P0002 presents for annual exam. Postmenopausal - on HRT. Taking prometrium days 1-12 with light bleeding most months. It was recommended she take continuously to help with this but she was unaware. She ran out of HRT 1 month ago and has had lots of menopausal symptoms. Normal pap and mammogram history. HTN managed by PCP. 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    Gynecologic History No LMP recorded. Patient is postmenopausal.   Contraception/Family planning: post menopausal status Sexually active: Yes  Health Maintenance Last Pap: 12/18/2016. Results were: Normal, 5-year repeat Last mammogram: 10/02/2016. Results were: Normal Last colonoscopy: 2022. Results were: Normal Last Dexa: Not indicated  Past medical history, past surgical history, family history and social history were all reviewed and documented in the EPIC chart. 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 Mother with history of breast cancer.   ROS:  A ROS was performed and pertinent positives and negatives are included.  Exam:  Vitals:   11/16/20 1420  BP: 124/78  Weight: 191 lb (86.6 kg)  Height: 5\' 2"  (1.575 m)   Body mass index is 34.93 kg/m.  General appearance:  Normal Thyroid:  Symmetrical, normal in size, without palpable masses or nodularity. Respiratory  Auscultation:  Clear without wheezing or rhonchi Cardiovascular  Auscultation:  Regular rate, without rubs, murmurs or gallops  Edema/varicosities:  Not grossly evident Abdominal  Soft,nontender, without masses, guarding or rebound.  Liver/spleen:  No organomegaly noted  Hernia:  None appreciated  Skin  Inspection:  Grossly normal Breasts: Examined lying and sitting.   Right: Without masses, retractions, nipple discharge or axillary adenopathy.   Left: Without masses, retractions, nipple discharge or axillary adenopathy. Genitourinary   Inguinal/mons:  Normal without  inguinal adenopathy  External genitalia:  Normal appearing vulva with no masses, tenderness, or lesions  BUS/Urethra/Skene's glands:  Normal  Vagina:  Normal appearing with normal color and discharge, no lesions  Cervix:  Normal appearing without discharge or lesions  Uterus:  Normal in size, shape and contour.  Midline and mobile, nontender  Adnexa/parametria:     Rt: Normal in size, without masses or tenderness.   Lt: Normal in size, without masses or tenderness.  Anus and perineum: Normal  Digital rectal exam: Normal sphincter tone without palpated masses or tenderness  Patient informed chaperone available to be present for breast and pelvic exam. Patient has requested no chaperone to be present. Patient has been advised what will be completed during breast and pelvic exam.   Assessment/Plan:  55 y.o. G2P0002 for annual exam.   Well female exam with routine gynecological exam - Education provided on SBEs, importance of preventative screenings, current guidelines, high calcium diet, regular exercise, and multivitamin daily.  Labs with PCP.  Hormone replacement therapy (HRT) - Plan: estradiol (VIVELLE-DOT) 0.05 MG/24HR patch, progesterone (PROMETRIUM) 100 MG capsule. Recommend taking Prometrium nightly to help with withdrawal bleeding. She is aware of risk for blood clots, heart attack, stroke,and breast cancer. She would like to continue. She ran out for the last month and has had lots of symptoms. Refill x 1 year provided.   Screening for cervical cancer - Normal Pap history.  Will repeat at 5-year interval per guidelines.  Screening for breast cancer - Normal mammogram history. Overdue and scheduled soon.  Normal breast exam today.  Screening for colon cancer - 2022 colonoscopy. Will repeat at GI's recommended interval.   Return in 1 year for annual.  Olivia Mackie DNP, 2:45 PM 11/16/2020

## 2021-08-09 DIAGNOSIS — H90A21 Sensorineural hearing loss, unilateral, right ear, with restricted hearing on the contralateral side: Secondary | ICD-10-CM | POA: Diagnosis not present

## 2021-09-18 DIAGNOSIS — G894 Chronic pain syndrome: Secondary | ICD-10-CM | POA: Diagnosis not present

## 2021-09-18 DIAGNOSIS — G609 Hereditary and idiopathic neuropathy, unspecified: Secondary | ICD-10-CM | POA: Diagnosis not present

## 2021-09-18 DIAGNOSIS — M79671 Pain in right foot: Secondary | ICD-10-CM | POA: Diagnosis not present

## 2021-09-18 DIAGNOSIS — M3130 Wegener's granulomatosis without renal involvement: Secondary | ICD-10-CM | POA: Diagnosis not present

## 2021-09-20 DIAGNOSIS — M3130 Wegener's granulomatosis without renal involvement: Secondary | ICD-10-CM | POA: Diagnosis not present

## 2021-09-20 DIAGNOSIS — Z79899 Other long term (current) drug therapy: Secondary | ICD-10-CM | POA: Diagnosis not present

## 2021-10-08 ENCOUNTER — Encounter: Payer: Self-pay | Admitting: Nurse Practitioner

## 2021-10-08 DIAGNOSIS — Z79890 Hormone replacement therapy (postmenopausal): Secondary | ICD-10-CM

## 2021-10-08 MED ORDER — PROGESTERONE MICRONIZED 100 MG PO CAPS: 100.0000 mg | ORAL_CAPSULE | Freq: Every day | ORAL | 0 refills | Status: DC

## 2021-10-08 MED ORDER — ESTRADIOL 0.05 MG/24HR TD PTTW: 1.0000 | MEDICATED_PATCH | TRANSDERMAL | 0 refills | Status: DC

## 2021-10-08 NOTE — Telephone Encounter (Signed)
AEX 11/16/20. Scheduled 11/19/21.

## 2021-11-08 DIAGNOSIS — Z23 Encounter for immunization: Secondary | ICD-10-CM | POA: Diagnosis not present

## 2021-11-08 DIAGNOSIS — Z0000 Encounter for general adult medical examination without abnormal findings: Secondary | ICD-10-CM | POA: Diagnosis not present

## 2021-11-08 DIAGNOSIS — Z87891 Personal history of nicotine dependence: Secondary | ICD-10-CM | POA: Diagnosis not present

## 2021-11-08 DIAGNOSIS — R350 Frequency of micturition: Secondary | ICD-10-CM | POA: Diagnosis not present

## 2021-11-08 DIAGNOSIS — I10 Essential (primary) hypertension: Secondary | ICD-10-CM | POA: Diagnosis not present

## 2021-11-08 DIAGNOSIS — Z79899 Other long term (current) drug therapy: Secondary | ICD-10-CM | POA: Diagnosis not present

## 2021-11-13 DIAGNOSIS — Z5181 Encounter for therapeutic drug level monitoring: Secondary | ICD-10-CM | POA: Diagnosis not present

## 2021-11-13 DIAGNOSIS — M79671 Pain in right foot: Secondary | ICD-10-CM | POA: Diagnosis not present

## 2021-11-13 DIAGNOSIS — M3130 Wegener's granulomatosis without renal involvement: Secondary | ICD-10-CM | POA: Diagnosis not present

## 2021-11-13 DIAGNOSIS — Z79899 Other long term (current) drug therapy: Secondary | ICD-10-CM | POA: Diagnosis not present

## 2021-11-13 DIAGNOSIS — G894 Chronic pain syndrome: Secondary | ICD-10-CM | POA: Diagnosis not present

## 2021-11-13 DIAGNOSIS — G609 Hereditary and idiopathic neuropathy, unspecified: Secondary | ICD-10-CM | POA: Diagnosis not present

## 2021-11-19 ENCOUNTER — Encounter: Payer: Self-pay | Admitting: Nurse Practitioner

## 2021-11-19 ENCOUNTER — Other Ambulatory Visit (HOSPITAL_COMMUNITY)
Admission: RE | Admit: 2021-11-19 | Discharge: 2021-11-19 | Disposition: A | Discharge status: Home / Self Care | Payer: BC Managed Care – PPO | Source: Ambulatory Visit | Attending: Nurse Practitioner | Admitting: Nurse Practitioner

## 2021-11-19 ENCOUNTER — Ambulatory Visit (INDEPENDENT_AMBULATORY_CARE_PROVIDER_SITE_OTHER): Payer: BC Managed Care – PPO | Admitting: Nurse Practitioner

## 2021-11-19 VITALS — BP 138/80 | HR 93 | Ht 61.0 in | Wt 188.0 lb

## 2021-11-19 DIAGNOSIS — Z79890 Hormone replacement therapy (postmenopausal): Secondary | ICD-10-CM

## 2021-11-19 DIAGNOSIS — Z01419 Encounter for gynecological examination (general) (routine) without abnormal findings: Secondary | ICD-10-CM

## 2021-11-19 MED ORDER — PROGESTERONE MICRONIZED 100 MG PO CAPS: 100.0000 mg | ORAL_CAPSULE | Freq: Every day | ORAL | 3 refills | Status: DC

## 2021-11-19 MED ORDER — ESTRADIOL 0.05 MG/24HR TD PTTW: 1.0000 | MEDICATED_PATCH | TRANSDERMAL | 3 refills | Status: DC

## 2021-11-19 NOTE — Progress Notes (Signed)
   Laura [name] 10/23/1965 [id number]   History:  56 y.o. G2P0002 presents for annual exam. Postmenopausal - on HRT. Normal pap history. HTN managed by PCP. 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    Gynecologic History No LMP recorded. Patient is postmenopausal.   Contraception/Family planning: post menopausal status Sexually active: No  Health Maintenance Last Pap: 12/18/2016. Results were: Normal, 5-year repeat Last mammogram: 02/01/2021. Results were: Normal after follow up imaging on left Last colonoscopy: 2022. Results were: Normal per patient Last Dexa: Not indicated  Past medical history, past surgical history, family history and social history were all reviewed and documented in the EPIC chart. 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 Mother with history of breast cancer.   ROS:  A ROS was performed and pertinent positives and negatives are included.  Exam:  Vitals:   11/19/21 1411  BP: 138/80  Pulse: 93  SpO2: 98%  Weight: 188 lb (85.3 kg)  Height: 5\' 1"  (1.549 m)    Body mass index is 35.52 kg/m.  General appearance:  Normal Thyroid:  Symmetrical, normal in size, without palpable masses or nodularity. Respiratory  Auscultation:  Clear without wheezing or rhonchi Cardiovascular  Auscultation:  Regular rate, without rubs, murmurs or gallops  Edema/varicosities:  Not grossly evident Abdominal  Soft,nontender, without masses, guarding or rebound.  Liver/spleen:  No organomegaly noted  Hernia:  None appreciated  Skin  Inspection:  Grossly normal Breasts: Examined lying and sitting.   Right: Without masses, retractions, nipple discharge or axillary adenopathy.   Left: Without masses, retractions, nipple discharge or axillary adenopathy. Genitourinary   Inguinal/mons:  Normal without inguinal adenopathy  External genitalia:  Normal appearing vulva with no masses, tenderness, or lesions  BUS/Urethra/Skene's glands:  Normal  Vagina:   Normal appearing with normal color and discharge, no lesions  Cervix:  Normal appearing without discharge or lesions  Uterus:  Normal in size, shape and contour.  Midline and mobile, nontender  Adnexa/parametria:     Rt: Normal in size, without masses or tenderness.   Lt: Normal in size, without masses or tenderness.  Anus and perineum: Normal  Digital rectal exam: Normal sphincter tone without palpated masses or tenderness  Patient informed chaperone available to be present for breast and pelvic exam. Patient has requested no chaperone to be present. Patient has been advised what will be completed during breast and pelvic exam.   Assessment/Plan:  56 y.o. G2P0002 for annual exam.   Well female exam with routine gynecological exam - Education provided on SBEs, importance of preventative screenings, current guidelines, high calcium diet, regular exercise, and multivitamin daily.  Labs with PCP.  Hormone replacement therapy (HRT) - Plan: estradiol (VIVELLE-DOT) 0.05 MG/24HR patch, progesterone (PROMETRIUM) 100 MG capsule. She is aware of risk for blood clots, heart attack, stroke,and breast cancer. She would like to continue. Refill x 1 year provided.   Screening for cervical cancer - Normal Pap history.  Pap today.   Screening for breast cancer - Normal mammogram history. Continue annual screenings. Normal breast exam today.  Screening for colon cancer - 2022 colonoscopy. Will repeat at GI's recommended interval.   Return in 1 year for annual.     Tamela Gammon DNP, 2:26 PM 11/19/2021

## 2021-11-23 LAB — CYTOLOGY - PAP
Comment: NEGATIVE
Diagnosis: NEGATIVE
High risk HPV: NEGATIVE

## 2021-11-26 ENCOUNTER — Other Ambulatory Visit: Payer: Self-pay | Admitting: Nurse Practitioner

## 2021-11-26 DIAGNOSIS — B9689 Other specified bacterial agents as the cause of diseases classified elsewhere: Secondary | ICD-10-CM

## 2021-11-26 MED ORDER — METRONIDAZOLE 500 MG PO TABS: 500.0000 mg | ORAL_TABLET | Freq: Two times a day (BID) | ORAL | 0 refills | Status: DC

## 2021-11-27 DIAGNOSIS — M3130 Wegener's granulomatosis without renal involvement: Secondary | ICD-10-CM | POA: Diagnosis not present

## 2021-11-27 DIAGNOSIS — Z79899 Other long term (current) drug therapy: Secondary | ICD-10-CM | POA: Diagnosis not present

## 2021-12-04 DIAGNOSIS — K760 Fatty (change of) liver, not elsewhere classified: Secondary | ICD-10-CM | POA: Diagnosis not present

## 2021-12-04 DIAGNOSIS — R109 Unspecified abdominal pain: Secondary | ICD-10-CM | POA: Diagnosis not present

## 2021-12-04 DIAGNOSIS — R82998 Other abnormal findings in urine: Secondary | ICD-10-CM | POA: Diagnosis not present

## 2021-12-07 ENCOUNTER — Other Ambulatory Visit: Payer: Self-pay | Admitting: Nurse Practitioner

## 2021-12-07 DIAGNOSIS — Z79890 Hormone replacement therapy (postmenopausal): Secondary | ICD-10-CM

## 2021-12-07 NOTE — Telephone Encounter (Signed)
Rx request from alternate pharmacy. Will send pt a msg to confirm where she needs script to go to.   [facility]

## 2021-12-10 NOTE — Telephone Encounter (Signed)
She is seeing [facility] for her back pain. I think she just got [place] confused. Will approve refill. Thanks.

## 2021-12-11 ENCOUNTER — Other Ambulatory Visit: Payer: Self-pay | Admitting: Nurse Practitioner

## 2021-12-11 DIAGNOSIS — Z79890 Hormone replacement therapy (postmenopausal): Secondary | ICD-10-CM

## 2021-12-11 NOTE — Telephone Encounter (Signed)
Last annual exam was 10/2021 [facility] appear request now for [facility].

## 2022-01-29 DIAGNOSIS — M3130 Wegener's granulomatosis without renal involvement: Secondary | ICD-10-CM | POA: Diagnosis not present

## 2022-01-29 DIAGNOSIS — Z79899 Other long term (current) drug therapy: Secondary | ICD-10-CM | POA: Diagnosis not present

## 2022-04-24 DIAGNOSIS — M79671 Pain in right foot: Secondary | ICD-10-CM | POA: Diagnosis not present

## 2022-04-24 DIAGNOSIS — M3130 Wegener's granulomatosis without renal involvement: Secondary | ICD-10-CM | POA: Diagnosis not present

## 2022-04-24 DIAGNOSIS — G609 Hereditary and idiopathic neuropathy, unspecified: Secondary | ICD-10-CM | POA: Diagnosis not present

## 2022-04-24 DIAGNOSIS — G894 Chronic pain syndrome: Secondary | ICD-10-CM | POA: Diagnosis not present

## 2022-04-24 DIAGNOSIS — Z5181 Encounter for therapeutic drug level monitoring: Secondary | ICD-10-CM | POA: Diagnosis not present

## 2022-04-24 DIAGNOSIS — Z79899 Other long term (current) drug therapy: Secondary | ICD-10-CM | POA: Diagnosis not present

## 2022-05-21 DIAGNOSIS — M3130 Wegener's granulomatosis without renal involvement: Secondary | ICD-10-CM | POA: Diagnosis not present

## 2022-05-21 DIAGNOSIS — Z79899 Other long term (current) drug therapy: Secondary | ICD-10-CM | POA: Diagnosis not present

## 2022-06-19 DIAGNOSIS — G894 Chronic pain syndrome: Secondary | ICD-10-CM | POA: Diagnosis not present

## 2022-06-19 DIAGNOSIS — M3130 Wegener's granulomatosis without renal involvement: Secondary | ICD-10-CM | POA: Diagnosis not present

## 2022-06-19 DIAGNOSIS — M79671 Pain in right foot: Secondary | ICD-10-CM | POA: Diagnosis not present

## 2022-06-19 DIAGNOSIS — G609 Hereditary and idiopathic neuropathy, unspecified: Secondary | ICD-10-CM | POA: Diagnosis not present

## 2022-06-20 DIAGNOSIS — Z5181 Encounter for therapeutic drug level monitoring: Secondary | ICD-10-CM | POA: Diagnosis not present

## 2022-06-20 DIAGNOSIS — Z79899 Other long term (current) drug therapy: Secondary | ICD-10-CM | POA: Diagnosis not present

## 2022-08-14 DIAGNOSIS — M3130 Wegener's granulomatosis without renal involvement: Secondary | ICD-10-CM | POA: Diagnosis not present

## 2022-08-14 DIAGNOSIS — G894 Chronic pain syndrome: Secondary | ICD-10-CM | POA: Diagnosis not present

## 2022-08-14 DIAGNOSIS — G609 Hereditary and idiopathic neuropathy, unspecified: Secondary | ICD-10-CM | POA: Diagnosis not present

## 2022-08-14 DIAGNOSIS — M79671 Pain in right foot: Secondary | ICD-10-CM | POA: Diagnosis not present

## 2022-09-24 ENCOUNTER — Other Ambulatory Visit: Payer: Self-pay | Admitting: Nurse Practitioner

## 2022-09-24 DIAGNOSIS — Z79890 Hormone replacement therapy (postmenopausal): Secondary | ICD-10-CM

## 2022-09-25 ENCOUNTER — Other Ambulatory Visit: Payer: Self-pay

## 2022-09-25 NOTE — Telephone Encounter (Signed)
Med refill request: [facility] Last AEX: 11/19/21 Next AEX: 11/21/22 Last MMG (if hormonal med) 10/02/16 Refill authorized: Please Advise, #24, 3 RF

## 2022-10-09 DIAGNOSIS — G609 Hereditary and idiopathic neuropathy, unspecified: Secondary | ICD-10-CM | POA: Diagnosis not present

## 2022-10-09 DIAGNOSIS — G894 Chronic pain syndrome: Secondary | ICD-10-CM | POA: Diagnosis not present

## 2022-10-09 DIAGNOSIS — M3130 Wegener's granulomatosis without renal involvement: Secondary | ICD-10-CM | POA: Diagnosis not present

## 2022-10-09 DIAGNOSIS — M79671 Pain in right foot: Secondary | ICD-10-CM | POA: Diagnosis not present

## 2022-11-12 DIAGNOSIS — Z0000 Encounter for general adult medical examination without abnormal findings: Secondary | ICD-10-CM | POA: Diagnosis not present

## 2022-11-12 DIAGNOSIS — Z1231 Encounter for screening mammogram for malignant neoplasm of breast: Secondary | ICD-10-CM | POA: Diagnosis not present

## 2022-11-12 DIAGNOSIS — I10 Essential (primary) hypertension: Secondary | ICD-10-CM | POA: Diagnosis not present

## 2022-11-12 DIAGNOSIS — Z131 Encounter for screening for diabetes mellitus: Secondary | ICD-10-CM | POA: Diagnosis not present

## 2022-11-12 DIAGNOSIS — Z13220 Encounter for screening for lipoid disorders: Secondary | ICD-10-CM | POA: Diagnosis not present

## 2022-11-15 DIAGNOSIS — Z23 Encounter for immunization: Secondary | ICD-10-CM | POA: Diagnosis not present

## 2022-11-15 DIAGNOSIS — M3130 Wegener's granulomatosis without renal involvement: Secondary | ICD-10-CM | POA: Diagnosis not present

## 2022-11-15 DIAGNOSIS — Z79899 Other long term (current) drug therapy: Secondary | ICD-10-CM | POA: Diagnosis not present

## 2022-11-15 DIAGNOSIS — Z7960 Long term (current) use of unspecified immunomodulators and immunosuppressants: Secondary | ICD-10-CM | POA: Diagnosis not present

## 2022-11-20 DIAGNOSIS — Z1231 Encounter for screening mammogram for malignant neoplasm of breast: Secondary | ICD-10-CM | POA: Diagnosis not present

## 2022-11-21 ENCOUNTER — Ambulatory Visit: Payer: BC Managed Care – PPO | Admitting: Nurse Practitioner

## 2022-11-23 ENCOUNTER — Other Ambulatory Visit: Payer: Self-pay | Admitting: Nurse Practitioner

## 2022-11-23 DIAGNOSIS — Z79890 Hormone replacement therapy (postmenopausal): Secondary | ICD-10-CM

## 2022-11-25 NOTE — Telephone Encounter (Signed)
Med refill request: [facility] Last AEX: 11/19/21 Next AEX: 12/16/22 Last MMG (if hormonal med) 10/02/16 Refill authorized: Please Advise, #24, 3 RF

## 2022-12-16 ENCOUNTER — Ambulatory Visit: Payer: BC Managed Care – PPO | Admitting: Nurse Practitioner

## 2022-12-26 DIAGNOSIS — G894 Chronic pain syndrome: Secondary | ICD-10-CM | POA: Diagnosis not present

## 2022-12-26 DIAGNOSIS — Z79899 Other long term (current) drug therapy: Secondary | ICD-10-CM | POA: Diagnosis not present

## 2022-12-26 DIAGNOSIS — G609 Hereditary and idiopathic neuropathy, unspecified: Secondary | ICD-10-CM | POA: Diagnosis not present

## 2022-12-26 DIAGNOSIS — Z5181 Encounter for therapeutic drug level monitoring: Secondary | ICD-10-CM | POA: Diagnosis not present

## 2022-12-26 DIAGNOSIS — M79671 Pain in right foot: Secondary | ICD-10-CM | POA: Diagnosis not present

## 2022-12-26 DIAGNOSIS — M3130 Wegener's granulomatosis without renal involvement: Secondary | ICD-10-CM | POA: Diagnosis not present

## 2023-01-15 DIAGNOSIS — Z23 Encounter for immunization: Secondary | ICD-10-CM | POA: Diagnosis not present

## 2023-01-22 ENCOUNTER — Other Ambulatory Visit: Payer: Self-pay | Admitting: Nurse Practitioner

## 2023-01-22 DIAGNOSIS — Z79890 Hormone replacement therapy (postmenopausal): Secondary | ICD-10-CM

## 2023-01-27 ENCOUNTER — Emergency Department (HOSPITAL_COMMUNITY): Payer: BC Managed Care – PPO

## 2023-01-27 ENCOUNTER — Emergency Department (HOSPITAL_COMMUNITY)
Admission: EM | Admit: 2023-01-27 | Discharge: 2023-01-27 | Disposition: A | Discharge status: Home / Self Care | Payer: BC Managed Care – PPO | Attending: Student | Admitting: Student

## 2023-01-27 ENCOUNTER — Other Ambulatory Visit: Payer: Self-pay

## 2023-01-27 ENCOUNTER — Encounter (HOSPITAL_COMMUNITY): Payer: Self-pay

## 2023-01-27 DIAGNOSIS — Z79899 Other long term (current) drug therapy: Secondary | ICD-10-CM | POA: Insufficient documentation

## 2023-01-27 DIAGNOSIS — Y92410 Unspecified street and highway as the place of occurrence of the external cause: Secondary | ICD-10-CM | POA: Diagnosis not present

## 2023-01-27 DIAGNOSIS — E039 Hypothyroidism, unspecified: Secondary | ICD-10-CM | POA: Diagnosis not present

## 2023-01-27 DIAGNOSIS — S20211A Contusion of right front wall of thorax, initial encounter: Secondary | ICD-10-CM | POA: Diagnosis not present

## 2023-01-27 DIAGNOSIS — R079 Chest pain, unspecified: Secondary | ICD-10-CM | POA: Diagnosis not present

## 2023-01-27 DIAGNOSIS — R0789 Other chest pain: Secondary | ICD-10-CM | POA: Insufficient documentation

## 2023-01-27 DIAGNOSIS — S6291XA Unspecified fracture of right wrist and hand, initial encounter for closed fracture: Secondary | ICD-10-CM | POA: Insufficient documentation

## 2023-01-27 DIAGNOSIS — I10 Essential (primary) hypertension: Secondary | ICD-10-CM | POA: Diagnosis not present

## 2023-01-27 DIAGNOSIS — S2020XA Contusion of thorax, unspecified, initial encounter: Secondary | ICD-10-CM | POA: Diagnosis not present

## 2023-01-27 DIAGNOSIS — S0990XA Unspecified injury of head, initial encounter: Secondary | ICD-10-CM | POA: Diagnosis not present

## 2023-01-27 DIAGNOSIS — S299XXA Unspecified injury of thorax, initial encounter: Secondary | ICD-10-CM | POA: Diagnosis not present

## 2023-01-27 DIAGNOSIS — R930 Abnormal findings on diagnostic imaging of skull and head, not elsewhere classified: Secondary | ICD-10-CM | POA: Diagnosis not present

## 2023-01-27 DIAGNOSIS — M79601 Pain in right arm: Secondary | ICD-10-CM | POA: Diagnosis not present

## 2023-01-27 DIAGNOSIS — S60211A Contusion of right wrist, initial encounter: Secondary | ICD-10-CM | POA: Diagnosis not present

## 2023-01-27 DIAGNOSIS — K449 Diaphragmatic hernia without obstruction or gangrene: Secondary | ICD-10-CM | POA: Diagnosis not present

## 2023-01-27 DIAGNOSIS — M25511 Pain in right shoulder: Secondary | ICD-10-CM | POA: Diagnosis not present

## 2023-01-27 DIAGNOSIS — Z041 Encounter for examination and observation following transport accident: Secondary | ICD-10-CM | POA: Diagnosis not present

## 2023-01-27 DIAGNOSIS — Z87891 Personal history of nicotine dependence: Secondary | ICD-10-CM | POA: Diagnosis not present

## 2023-01-27 DIAGNOSIS — S4991XA Unspecified injury of right shoulder and upper arm, initial encounter: Secondary | ICD-10-CM | POA: Diagnosis not present

## 2023-01-27 DIAGNOSIS — M25531 Pain in right wrist: Secondary | ICD-10-CM | POA: Diagnosis not present

## 2023-01-27 DIAGNOSIS — S6991XA Unspecified injury of right wrist, hand and finger(s), initial encounter: Secondary | ICD-10-CM | POA: Diagnosis not present

## 2023-01-27 DIAGNOSIS — S20219A Contusion of unspecified front wall of thorax, initial encounter: Secondary | ICD-10-CM

## 2023-01-27 MED ORDER — KETOROLAC TROMETHAMINE 15 MG/ML IJ SOLN
15.0000 mg | Freq: Once | INTRAMUSCULAR | Status: AC
  Administered 2023-01-27: 15 mg via INTRAMUSCULAR
  Filled 2023-01-27: qty 1

## 2023-01-27 MED ORDER — ACETAMINOPHEN 500 MG PO TABS
1000.0000 mg | ORAL_TABLET | Freq: Once | ORAL | Status: AC
  Administered 2023-01-27: 1000 mg via ORAL
  Filled 2023-01-27: qty 2

## 2023-01-27 MED ORDER — NAPROXEN 375 MG PO TABS: 375.0000 mg | ORAL_TABLET | Freq: Two times a day (BID) | ORAL | 0 refills | Status: DC
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
## 2023-01-27 NOTE — Telephone Encounter (Signed)
Med refill request: HRT Patch Last AEX: 11/19/2021-TW Next AEX: 03/13/2023 Last MMG (if hormonal med) 11/20/2022-neg birads 1 Refill authorized: rx pend

## 2023-01-29 ENCOUNTER — Encounter: Payer: Self-pay | Admitting: Orthopedic Surgery

## 2023-01-29 ENCOUNTER — Ambulatory Visit: Payer: BC Managed Care – PPO | Admitting: Orthopedic Surgery

## 2023-01-29 VITALS — BP 142/85 | HR 77 | Ht 61.0 in | Wt 188.0 lb

## 2023-01-29 DIAGNOSIS — M79601 Pain in right arm: Secondary | ICD-10-CM
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
## 2023-01-29 NOTE — Patient Instructions (Signed)
Please provide a note for work - ok to return on 12/7 without restrictions

## 2023-02-04 DIAGNOSIS — S6981XA Other specified injuries of right wrist, hand and finger(s), initial encounter: Secondary | ICD-10-CM | POA: Diagnosis not present

## 2023-02-07 ENCOUNTER — Encounter: Payer: Self-pay | Admitting: Nurse Practitioner

## 2023-02-07 NOTE — Telephone Encounter (Signed)
No, OV is needed

## 2023-02-10 NOTE — Telephone Encounter (Signed)
Per CS: "Left message for patient to call and schedule appointment." 

## 2023-02-12 DIAGNOSIS — Z7960 Long term (current) use of unspecified immunomodulators and immunosuppressants: Secondary | ICD-10-CM | POA: Diagnosis not present

## 2023-02-12 DIAGNOSIS — Z79899 Other long term (current) drug therapy: Secondary | ICD-10-CM | POA: Diagnosis not present

## 2023-02-12 DIAGNOSIS — M3130 Wegener's granulomatosis without renal involvement: Secondary | ICD-10-CM | POA: Diagnosis not present

## 2023-02-15 ENCOUNTER — Other Ambulatory Visit: Payer: Self-pay | Admitting: Nurse Practitioner

## 2023-02-15 DIAGNOSIS — Z79890 Hormone replacement therapy (postmenopausal): Secondary | ICD-10-CM

## 2023-02-17 NOTE — Telephone Encounter (Signed)
Med refill request: progesterone Last AEX: 11/19/2021 Next AEX: 03/13/2023 Last MMG (if hormonal med) 10/02/2016 Refill authorized: Last Rx was sent #9 with 3 refills. Please approve or deny as appropriate.

## 2023-03-11 ENCOUNTER — Encounter: Payer: Self-pay | Admitting: Orthopedic Surgery

## 2023-03-11 ENCOUNTER — Ambulatory Visit: Payer: BC Managed Care – PPO | Admitting: Orthopedic Surgery

## 2023-03-11 VITALS — BP 129/79 | HR 88 | Ht 61.0 in | Wt 189.0 lb

## 2023-03-11 DIAGNOSIS — M79641 Pain in right hand: Secondary | ICD-10-CM | POA: Diagnosis not present
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
## 2023-03-11 NOTE — Patient Instructions (Signed)
 Hand Exercises  Hand exercises can be helpful for almost anyone. These exercises can strengthen the hands, improve flexibility and movement, and increase blood flow to the hands. These results can make work and daily tasks easier. Hand exercises can be especially helpful for people who have joint pain from arthritis or have nerve damage from overuse (carpal tunnel syndrome). These exercises can also help people who have injured a hand.  Exercises Most of these hand exercises are gentle stretching and motion exercises. It is usually safe to do them often throughout the day. Warming up your hands before exercise may help to reduce stiffness. You can do this with gentle massage or by placing your hands in warm water  for 10-15 minutes. It is normal to feel some stretching, pulling, tightness, or mild discomfort as you begin new exercises. This will gradually improve. Stop an exercise right away if you feel sudden, severe pain or your pain gets worse. Ask your health care provider which exercises are best for you. Knuckle bend or claw fist Stand or sit with your arm, hand, and all five fingers pointed straight up. Make sure to keep your wrist straight during the exercise. Gently bend your fingers down toward your palm until the tips of your fingers are touching the top of your palm. Keep your big knuckle straight and just bend the small knuckles in your fingers. Hold this position for 10 seconds. Straighten (extend) your fingers back to the starting position. Repeat this exercise 5-10 times with each hand. Full finger fist Stand or sit with your arm, hand, and all five fingers pointed straight up. Make sure to keep your wrist straight during the exercise. Gently bend your fingers into your palm until the tips of your fingers are touching the middle of your palm. Hold this position for 10 seconds. Extend your fingers back to the starting position, stretching every joint fully. Repeat this exercise  5-10 times with each hand. Straight fist Stand or sit with your arm, hand, and all five fingers pointed straight up. Make sure to keep your wrist straight during the exercise. Gently bend your fingers at the big knuckle, where your fingers meet your hand, and the middle knuckle. Keep the knuckle at the tips of your fingers straight and try to touch the bottom of your palm. Hold this position for 10 seconds. Extend your fingers back to the starting position, stretching every joint fully. Repeat this exercise 5-10 times with each hand. Tabletop Stand or sit with your arm, hand, and all five fingers pointed straight up. Make sure to keep your wrist straight during the exercise. Gently bend your fingers at the big knuckle, where your fingers meet your hand, as far down as you can while keeping the small knuckles in your fingers straight. Think of forming a tabletop with your fingers. Hold this position for 10 seconds. Extend your fingers back to the starting position, stretching every joint fully. Repeat this exercise 5-10 times with each hand. Finger spread Place your hand flat on a table with your palm facing down. Make sure your wrist stays straight as you do this exercise. Spread your fingers and thumb apart from each other as far as you can until you feel a gentle stretch. Hold this position for 10 seconds. Bring your fingers and thumb tight together again. Hold this position for 10 seconds. Repeat this exercise 5-10 times with each hand. Making circles Stand or sit with your arm, hand, and all five fingers pointed straight up. Make  sure to keep your wrist straight during the exercise. Make a circle by touching the tip of your thumb to the tip of your index finger. Hold for 10 seconds. Then open your hand wide. Repeat this motion with your thumb and each finger on your hand. Repeat this exercise 5-10 times with each hand. Thumb motion Sit with your forearm resting on a table and your wrist  straight. Your thumb should be facing up toward the ceiling. Keep your fingers relaxed as you move your thumb. Lift your thumb up as high as you can toward the ceiling. Hold for 10 seconds. Bend your thumb across your palm as far as you can, reaching the tip of your thumb for the small finger (pinkie) side of your palm. Hold for 10 seconds. Repeat this exercise 5-10 times with each hand. Grip strengthening Hold a stress ball or other soft ball in the middle of your hand. Slowly increase the pressure, squeezing the ball as much as you can without causing pain. Think of bringing the tips of your fingers into the middle of your palm. All of your finger joints should bend when doing this exercise. Hold your squeeze for 10 seconds, then relax. Repeat this exercise 5-10 times with each hand.   Contact a health care provider if: Your hand pain or discomfort gets much worse when you do an exercise. Your hand pain or discomfort does not improve within 2 hours after you exercise. If you have any of these problems, stop doing these exercises right away. Do not do them again unless your health care provider says that you can.    Get help right away if: You develop sudden, severe hand pain or swelling. If this happens, stop doing these exercises right away. Do not do them again unless your health care provider says that you can. This information is not intended to replace advice given to you by your health care provider. Make sure you discuss any questions you have with your health care provider.   Wrist Fracture Rehab Ask your health care provider which exercises are safe for you. Do exercises exactly as told by your health care provider and adjust them as directed. It is normal to feel mild stretching, pulling, tightness, or discomfort as you do these exercises. Stop right away if you feel sudden pain or your pain gets worse. Do not begin these exercises until told by your health care  provider. Stretching and range-of-motion exercises These exercises warm up your muscles and joints and improve the movement and flexibility of your wrist and hand. These exercises also help to relieve pain,numbness, and tingling. Finger flexion and extension Sit or stand with your elbow at your side. Open and stretch your left / right fingers as wide as you can (extension). Hold this position for 10 seconds. Close your left / right fingers into a gentle fist (flexion). Hold this position for 10 seconds. Slowly return to the starting position. Repeat 10 times. Complete this exercise 1-2 times a day. Wrist flexion Bend your left / right elbow to a 90-degree angle (right angle) with your palm facing the floor. Bend your wrist forward so your fingers point toward the floor (flexion). Hold this position for 10 seconds. Slowly return to the starting position. Repeat 10 times. Complete this exercise 1-2 times a day. Wrist extension Bend your left / right elbow to a 90-degree angle (right angle) with your palm facing the floor. Bend your wrist backward so your fingers point toward the ceiling (  extension). Hold this position for 10 seconds. Slowly return to the starting position. Repeat 10 times. Complete this exercise 1-2 times a day. Ulnar deviation Bend your left / right elbow to a 90-degree angle (right angle), and rest your forearm on a table with your palm facing down. Keeping your hand flat on the table, bend your left / right wrist toward your small finger (pinkie). This is ulnar deviation. Hold this position for 10 seconds. Slowly return to the starting position. Repeat 10 times. Complete this exercise 1-2 times a day. Radial deviation Bend your left / right elbow to a 90-degree angle (right angle), and rest your forearm on a table with your palm facing down. Keeping your hand flat on the table, bend your left / right wrist toward your thumb. This is radial deviation. Hold this  position for 10 seconds. Slowly return to the starting position. Repeat 10 times. Complete this exercise 1-2 times a day. Forearm rotation, supination Stand or sit with your left / right elbow bent to a 90-degree angle (right angle) at your side. Position your forearm so that the thumb is facing the ceiling (neutral position). Turn (rotate) your palm up toward the ceiling (supination), stopping when you feel a gentle stretch. Hold this position for 10 seconds. Slowly return to the starting position. Repeat 10 times. Complete this exercise 1-2 times a day. Forearm rotation, pronation Stand or sit with your left / right elbow bent to a 90-degree angle (right angle) at your side. Position your forearm so that the thumb is facing the ceiling (neutral position). Turn (rotate) your palm down toward the floor (pronation), stopping when you feel a gentle stretch. Hold this position for 10 seconds. Slowly return to the starting position. Repeat 10 times. Complete this exercise 1-2 times a day. Wrist flexion stretch  Extend your left / right arm in front of you and turn your palm down toward the floor. If told by your health care provider, bend your left / right arm to a 90-degree angle (right angle) at your side. Using your uninjured hand, gently press over the back of your left / right hand to bend your wrist and fingers toward the floor (flexion). Go as far as you can to feel a stretch without causing pain. Hold this position for 10 seconds. Slowly return to the starting position. Repeat 10 times. Complete this exercise 1-2 times a day. Wrist extension stretch  Extend your left / right arm in front of you and turn your palm up toward the ceiling. If told by your health care provider, bend your left / right arm to a 90-degree angle (right angle) at your side. Using your uninjured hand, gently press over the palm of your left / right hand to bend your wrist and fingers toward the floor (extension).  Go as far as you can to feel a stretch without causing pain. Hold this position for 10 seconds. Slowly return to the starting position. Repeat 10 times. Complete this exercise 1-2 times a day. Forearm rotation stretch, supination Stand or sit with your arms at your sides. Bend your left / right elbow to a 90-degree angle (right angle). Using your uninjured hand, turn your left / right palm up toward the ceiling (assisted supination) until you feel a gentle stretch in the inside of your forearm. Hold this position for 10 seconds. Slowly return to the starting position. Repeat 10 times. Complete this exercise 1-2 times a day. Forearm rotation stretch, pronation Stand or sit with  your arms at your sides. Bend your left / right elbow to a 90-degree angle (right angle). Using your uninjured hand, turn your left / right palm down toward the floor (assisted pronation) until you feel a gentle stretch in the top of your forearm. Hold this position for 10 seconds. Slowly return to the starting position. Repeat 10 times. Complete this exercise 1-2 times a day. Strengthening exercises These exercises build strength and endurance in your wrist and hand. Enduranceis the ability to use your muscles for a long time, even after they get tired. Wrist flexion Sit with your left / right forearm supported on a table. Your elbow should be at waist height. Rest your hand over the edge of the table, palm up. Gently grasp a 5 lb / kg weight (can of soup). Or, hold an exercise band or tube in both hands, keeping your hands at the same level and hip distance apart. There should be slight tension in the exercise band or tube. Without moving your forearm or elbow, slowly bend your wrist up toward the ceiling (wrist flexion). Hold this position for 10 seconds. Slowly return to the starting position. Repeat 10 times. Complete this exercise 1-2 times a day. Wrist extension Sit with your left / right forearm supported  on a table. Your elbow should be at waist height. Rest your hand over the edge of the table, palm down. Gently grasp a 5 lb / kg weight. Or, hold an exercise band or tube in both hands, keeping your hands at the same level and hip distance apart. There should be slight tension in the exercise band or tube. Without moving your forearm or elbow, slowly curl your hand up toward the ceiling (extension). Hold this position for 10 seconds. Slowly return to the starting position. Repeat 10 times. Complete this exercise 1-2 times a day. Forearm rotation, supination  Sit with your left / right forearm supported on a table. Your elbow should be at waist height. Rest your hand over the edge of the table, palm down. Gently grasp a lightweight hammer near the head. As this exercise gets easier for you, try holding the hammer farther down the handle. Without moving your elbow, slowly turn (rotate) your palm up toward the ceiling (supination). Hold this position for 10 seconds. Slowly return to the starting position. Repeat 10 times. Complete this exercise 1-2 times a day. Forearm rotation, pronation  Sit with your left / right forearm supported on a table. Your elbow should be at waist height. Rest your hand over the edge of the table, palm up. Gently grasp a lightweight hammer near the head. As this exercise gets easier for you, try holding the hammer farther down the handle. Without moving your elbow, slowly turn (rotate) your palm down toward the floor (pronation). Hold this position for 10 seconds. Slowly return to the starting position. Repeat 10 times. Complete this exercise 1-2 times a day. Grip strengthening  Hold one of these items in your left / right hand: a dense sponge, a stress ball, or a large, rolled sock. Slowly squeeze the object as hard as you can without increasing any pain. Hold your squeeze for 10 seconds. Slowly release your grip. Repeat 10 times. Complete this exercise 1-2  times a day. This information is not intended to replace advice given to you by your health care provider. Make sure you discuss any questions you have with your healthcare provider. Document Revised: 06/24/2019 Document Reviewed: 06/24/2019 Elsevier Patient Education  2022 [employer]     We have discussed taking over the counter medications for your pain.  Below are some common medicines to consider.  Also listed are the recommended doses and how to take these medications as a prescription strength medicine.  If you experience any issues or side effects, please discontinue the medicine and contact the office for some assistance.   Tylenol  or acetaminophen  - can be used to help treat minor pains and fevers or chills.  Common doses include 350 mg, 500 mg and 650 mg.  Following surgery, I recommend taking 1000 mg of this medication three times a day.  Some narcotic pain medications contain acetaminophen , so you cannot take additional acetaminophen . This medicine can be bad for your liver.  DO NOT TAKE MORE THAN 4000 mg per day.  Advil, Motrin or Ibuprofen - anti-inflammatory medicines.  Can be used to treat chronic pain and help with swelling.  Common over the counter dose includes 200 mg.  Bottle states you can take 1-2 pills.  Common prescription doses include 600 mg or 800 mg pills.  This medication can be hard on your stomach and your kidneys.  If you have a history of stomach ulcers or kidney issues, you should discuss this with your PCP.  This medicine can be taken 3 times per day.  You should take this medication with food in your stomach.   Aleve  or Naproxen  - anti inflammatory medicines.  Can be used to treat chronic pain and help with swelling. Common over the counter dose is 220 mg.  The bottle states you can take 1 pill, twice a day.   Typical prescription dose is 500 mg two times a day.  This medication can be hard on your stomach and your kidneys.  If you have a history of stomach  ulcers or kidney issues, you should discuss this with your PCP.  This medicine can be taken 2 times per day.  You should take this medication with food in your stomach.    Acetaminopen and ibuprofen/naproxen  can be taking at the same time, but please do not take ibuprofen and naproxen  at the same time.  These medications work the same way and can cause further injury to your stomach and/or kidneys.    Patients taking a blood thinner are often encouraged not to take NSAIDs or medications like ibuprofen or naproxen  because they increase the possibility of internal bleeding.  If you have any questions, you should contact the doctor who recommended these medicines.

## 2023-03-13 ENCOUNTER — Encounter: Payer: Self-pay | Admitting: Nurse Practitioner

## 2023-03-13 ENCOUNTER — Ambulatory Visit: Payer: BC Managed Care – PPO | Admitting: Nurse Practitioner

## 2023-03-13 VITALS — BP 132/64 | HR 79 | Ht 61.0 in | Wt 189.0 lb

## 2023-03-13 DIAGNOSIS — N950 Postmenopausal bleeding: Secondary | ICD-10-CM | POA: Diagnosis not present

## 2023-03-13 DIAGNOSIS — Z01419 Encounter for gynecological examination (general) (routine) without abnormal findings: Secondary | ICD-10-CM

## 2023-03-13 DIAGNOSIS — F331 Major depressive disorder, recurrent, moderate: Secondary | ICD-10-CM

## 2023-03-13 DIAGNOSIS — Z79890 Hormone replacement therapy (postmenopausal): Secondary | ICD-10-CM
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
## 2023-03-26 ENCOUNTER — Other Ambulatory Visit: Payer: Self-pay | Admitting: Nurse Practitioner

## 2023-03-26 DIAGNOSIS — Z79890 Hormone replacement therapy (postmenopausal): Secondary | ICD-10-CM

## 2023-03-26 NOTE — Telephone Encounter (Signed)
Medication refill request: dotti 0.05mg  Last AEX:  03-13-23 Next AEX: 03-15-24 Last MMG (if hormonal medication request): 11-20-22 birads 1:neg (care everywhere) Refill authorized: please approve or deny as appropriate

## 2023-04-01 ENCOUNTER — Ambulatory Visit: Payer: BC Managed Care – PPO | Admitting: Nurse Practitioner

## 2023-04-01 ENCOUNTER — Ambulatory Visit (INDEPENDENT_AMBULATORY_CARE_PROVIDER_SITE_OTHER): Payer: BC Managed Care – PPO

## 2023-04-01 ENCOUNTER — Other Ambulatory Visit: Payer: BC Managed Care – PPO | Admitting: Nurse Practitioner

## 2023-04-01 ENCOUNTER — Other Ambulatory Visit: Payer: BC Managed Care – PPO

## 2023-04-01 VITALS — BP 122/78 | HR 85 | Wt 186.0 lb

## 2023-04-01 DIAGNOSIS — N950 Postmenopausal bleeding: Secondary | ICD-10-CM

## 2023-04-01 DIAGNOSIS — Z79890 Hormone replacement therapy (postmenopausal): Secondary | ICD-10-CM

## 2023-04-01 DIAGNOSIS — R9389 Abnormal findings on diagnostic imaging of other specified body structures: Secondary | ICD-10-CM

## 2023-04-01 NOTE — Progress Notes (Signed)
   Acute Office Visit  Subjective:    Patient ID: Laura [name], female    DOB: 29-Dec-1965, 57 y.o.   MRN: [id number]   HPI 58 y.o. presents today for ultrasound. Seen 03/13/23 for annual and reported bleeding this past month that has lasted weeks. Still bleeding light today. Bleeding moderate at times. Has a couple of cycles per year. No missed doses of HRT.  No LMP recorded. Patient is postmenopausal.    Review of Systems  Constitutional: Negative.   Genitourinary:  Positive for vaginal bleeding.       Objective:    Physical Exam Constitutional:      Appearance: Normal appearance.   GU: Not indicated  BP 122/78 (BP Location: Right Arm, Patient Position: Sitting, Cuff Size: Normal)   Pulse 85   Wt 186 lb (84.4 kg)   SpO2 99%   BMI 35.14 kg/m  Wt Readings from Last 3 Encounters:  04/01/23 186 lb (84.4 kg)  03/13/23 189 lb (85.7 kg)  03/11/23 189 lb (85.7 kg)        Assessment & Plan:   Problem List Items Addressed This Visit   None Visit Diagnoses       PMB (postmenopausal bleeding)    -  Primary   Relevant Orders   Endometrial biopsy     Postmenopausal hormone therapy         Thickened endometrium       Relevant Orders   Endometrial biopsy      Vaginal ultrasound: Anteverted uterus, normal size and shape, no myometrial masses.  Endometrium 7-8 mm, avascular.  Both ovaries mobile, normal size with normal follicle pattern and normal perfusion.  No adnexal masses, no free fluid.  Plan: Ultrasound reviewed with patient. Endometrium thickened. Schedule EMB. Discussed risks and what to expect with EMB.     Laura DELENA Shutter DNP, 10:33 AM 04/01/2023

## 2023-04-01 NOTE — Progress Notes (Deleted)
   Acute Office Visit  Subjective:    Patient ID: Laura [name], female    DOB: May 20, 1965, 58 y.o.   MRN: [id number]   HPI 58 y.o. presents today for ultrasound. Seen 03/13/23 for annual and reported bleeding this past month that lasted weeks. Bleeding moderate at times. Has a couple of cycles per year. No missed doses of HRT.  No LMP recorded. Patient is postmenopausal.    Review of Systems     Objective:    Physical Exam  There were no vitals taken for this visit. Wt Readings from Last 3 Encounters:  03/13/23 189 lb (85.7 kg)  03/11/23 189 lb (85.7 kg)  01/29/23 188 lb (85.3 kg)        Patient informed chaperone available to be present for breast and/or pelvic exam. Patient has requested no chaperone to be present. Patient has been advised what will be completed during breast and pelvic exam.   Assessment & Plan:   Problem List Items Addressed This Visit   None   No follow-ups on file.    Laura DELENA Shutter DNP, 7:55 AM 04/01/2023

## 2023-04-23 ENCOUNTER — Ambulatory Visit: Payer: BC Managed Care – PPO | Admitting: Nurse Practitioner

## 2023-04-23 NOTE — Progress Notes (Deleted)
      ENDOMETRIAL BIOPSY       Laura [name] 58 y.o. presents for endometrial biopsy. Reason for biopsy: PMB, thickened endometrium 7-8. On HRT.  The indications for endometrial biopsy were reviewed.    Risks of the biopsy including cramping, bleeding, infection, uterine perforation, inadequate specimen and need for additional procedures  were discussed.  The patient states she understands and agrees to undergo procedure today. Consent obtained.  Time out was performed.   Procedure Speculum inserted into the vagina, cervix visualized and was prepped with Betadine. A single-toothed tenaculum was placed on the anterior lip of the cervix to stabilize it.  The 3 mm pipelle was introduced into the endometrial cavity without difficulty to a depth of ***cm, suction initiated and a moderate amount of tissue was obtained and sent to pathology.  The instruments were removed from the patient's vagina.  Minimal bleeding from the cervix was noted.  The patient tolerated the procedure well.   *** present for exam  Assessment/Plan: There are no diagnoses linked to this encounter.   Routine post-procedure instructions were given to the patient.   Will contact with results of biopsy.    Olivia Mackie DNP, 9:50 AM 04/23/2023

## 2023-05-07 ENCOUNTER — Other Ambulatory Visit (HOSPITAL_COMMUNITY)
Admission: RE | Admit: 2023-05-07 | Discharge: 2023-05-07 | Disposition: A | Discharge status: Home / Self Care | Source: Ambulatory Visit | Attending: Nurse Practitioner | Admitting: Nurse Practitioner

## 2023-05-07 ENCOUNTER — Encounter: Payer: Self-pay | Admitting: Nurse Practitioner

## 2023-05-07 ENCOUNTER — Ambulatory Visit: Payer: BC Managed Care – PPO | Admitting: Nurse Practitioner

## 2023-05-07 VITALS — BP 108/82 | HR 94

## 2023-05-07 DIAGNOSIS — R9389 Abnormal findings on diagnostic imaging of other specified body structures: Secondary | ICD-10-CM | POA: Diagnosis present

## 2023-05-07 DIAGNOSIS — N950 Postmenopausal bleeding: Secondary | ICD-10-CM | POA: Diagnosis not present

## 2023-05-07 NOTE — Progress Notes (Signed)
      ENDOMETRIAL BIOPSY       Laura [name] 58 y.o. presents for endometrial biopsy. Reason for biopsy: PMB, thickened endometrium 7-8. On HRT. Increased Prometrium to 200 mg since last visit. Bleeding stopped for about 4 days then restarted. Spotting today.   The indications for endometrial biopsy were reviewed.    Risks of the biopsy including cramping, bleeding, infection, uterine perforation, inadequate specimen and need for additional procedures  were discussed.  The patient states she understands and agrees to undergo procedure today. Consent obtained.  Time out was performed.   Procedure Speculum inserted into the vagina, cervix visualized and was prepped with Betadine. A single-toothed tenaculum was placed on the anterior lip of the cervix to stabilize it.  The 3 mm pipelle was introduced into the endometrial cavity without difficulty to a depth of 8 cm, suction initiated and a moderate amount of tissue was obtained and sent to pathology.  The instruments were removed from the patient's vagina.  Minimal bleeding from the cervix was noted.  The patient tolerated the procedure well.   Jodelle Red, CMA present for exam  Assessment/Plan: PMB (postmenopausal bleeding) - Plan: Endometrial biopsy, Surgical pathology  Thickened endometrium - Plan: Endometrial biopsy, Surgical pathology  Routine post-procedure instructions were given to the patient.   Will contact with results of biopsy.    Olivia Mackie DNP, 2:17 PM 05/07/2023

## 2023-05-09 LAB — SURGICAL PATHOLOGY

## 2023-05-12 ENCOUNTER — Other Ambulatory Visit: Payer: Self-pay | Admitting: Nurse Practitioner

## 2023-05-12 ENCOUNTER — Telehealth: Payer: Self-pay | Admitting: Nurse Practitioner

## 2023-05-12 DIAGNOSIS — C541 Malignant neoplasm of endometrium: Secondary | ICD-10-CM

## 2023-05-12 NOTE — Telephone Encounter (Signed)
 Spoke with patient regarding results of endometrial biopsy with recommendations to stop HRT and see gyn onc. Urgent referral in process. All questions answered. Patient appreciative of call.

## 2023-05-13 ENCOUNTER — Telehealth: Payer: Self-pay

## 2023-05-13 NOTE — Telephone Encounter (Signed)
 Spoke with Laura [name] regarding her referral to GYN oncology. She has an appointment scheduled with Dr. Pricilla Holm on 06/05/23 at 10:30. Patient agrees to date and time. She has been provided with office address and location. She is also aware of our mask and visitor policy. Patient verbalized understanding and will call with any questions.

## 2023-06-03 ENCOUNTER — Encounter: Payer: Self-pay | Admitting: Gynecologic Oncology

## 2023-06-04 NOTE — Progress Notes (Unsigned)
 GYNECOLOGIC ONCOLOGY NEW PATIENT CONSULTATION   Patient Name: [name]  Patient Age: 58 y.o. 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    Patient desires to proceed with surgical management.    The patient is a suitable candidate for hysterectomy via a minimally invasive approach to surgery.  Given that she is postmenopausal, a bilateral salpingo-oophorectomy is also recommended.  We reviewed that robotic assistance would be used to complete the surgery.  We discussed that endometrial cancer is detected in about 40% of final uterine pathology specimens from patients with EIN.  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   Discussed 3 options in terms of plan for surgery.  The first would be to send the uterus for intraoperative frozen pathology.  If cancer is identified at the time of surgery, additional procedures including lymph node evaluation, is recommended.  The second option we discussed would be to proceed with injection of ICG and perform sentinel lymph node mapping although wait to remove sentinel lymph nodes until the uterus has been sent for frozen.  This would help prevent the need for unnecessary lymph node removal if no cancer diagnosed but allow for sentinel lymph node biopsy if lymph node evaluation is indicated.  The third option we discussed would be to seed with ICG injection with sentinel lymph node biopsy at the start of the procedure followed by hysterectomy.  I reviewed that this would mean overtreatment if  no cancer ultimately diagnosed.  After discussing the 3 options, the patient would like to proceed with ICG injection with sentinel lymph node biopsy and total hysterectomy with bilateral salpingo-oophorectomy.  We reviewed the sentinel lymph node technique. Risks and benefits of sentinel lymph node biopsy was reviewed. We reviewed the technique and ICG dye. The patient DOES NOT have an iodine allergy or known liver dysfunction. We reviewed the false negative rate (0.4%), and that 3% of patients with metastatic disease will not have it detected by SLN biopsy in endometrial cancer. A low risk of allergic reaction to the dye, <0.2% for ICG, has been reported. We also discussed that in the case of failed mapping, which occurs 40% of the time, a bilateral or unilateral lymphadenectomy will be performed at the surgeon's discretion.   Potential benefits of sentinel nodes including a higher detection rate for metastasis due to ultrastaging and potential reduction in operative morbidity. However, there remains uncertainty as to the role for treatment of micrometastatic disease. Further, the benefit of operative morbidity associated with the SLN technique in endometrial cancer is not yet completely known. In other patient populations (e.g. the cervical cancer population) there has been observed reductions in morbidity with SLN biopsy compared to pelvic lymphadenectomy. Lymphedema, nerve dysfunction and lymphocysts are all potential risks with the SLN technique as with complete lymphadenectomy. Additional risks to the patient include the risk of damage to an internal organ while operating in an altered view (e.g. the black and white image of the robotic fluorescence imaging mode).   We reviewed the plan for a robotic assisted hysterectomy, bilateral salpingo-oophorectomy, sentinel lymph node evaluation, possible lymph node dissection, possible laparotomy. The risks of surgery  were discussed in detail and she understands  these to include infection; wound separation; hernia; vaginal cuff separation, injury to adjacent organs such as bowel, bladder, blood vessels, ureters and nerves; bleeding which may require blood transfusion; anesthesia risk; thromboembolic events; possible death; unforeseen complications; possible need for re-exploration; medical complications such as heart attack, stroke, pleural effusion and pneumonia; and, if full lymphadenectomy is performed the risk of lymphedema and lymphocyst. The patient will receive DVT and antibiotic prophylaxis as indicated. She voiced a clear understanding. She had the opportunity to ask questions. Perioperative instructions were reviewed with her. Prescriptions for post-op medications were sent to her pharmacy of choice.  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  She follows with rheumatology at [facility].  We will reach out about her immunosuppressive medication and holding this in the perioperative period.  A copy of this note was sent to the patient's referring provider.   65 minutes of total time was spent for this patient encounter, including preparation, face-to-face counseling with the patient and coordination of care, and documentation of the encounter.  Eugene Garnet, MD  Division of Gynecologic Oncology  Department of Obstetrics and Gynecology  [facility]  ___________________________________________  Chief Complaint: Chief Complaint  Patient presents with   EIN    History of Present Illness:  YARET HUSH is a 58 y.o. y.o. female who is seen in consultation at the request of Wyline Beady, NP for an evaluation of endometrial intra-epithelial neoplasia.  Patient developed postmenopausal bleeding.  She was seen in January of this year and reported bleeding that started in December that lasted weeks.  Endorsed several cycles over the last year. Her Prometrium was increased to 200 mg as part of her  HRT regimen.  Bleeding stopped for several days and then restarted. 04/01/23: Pelvic ultrasound exam at [facility] revealed a uterus with normal size measurements, no myometrial masses.  Endometrium 7-8 mm, avascular.  Bilateral ovaries normal in appearance.  No free fluid. 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  Patient reports doing well.  She endorses since being diagnosed with her Wegener's syndrome, in her late 54s, that she has had menses once or twice a year.  She began bleeding in November and only stopped bleeding last month.  She describes the bleeding as heavy, using 5 tampons and 3 pads on her heaviest days which were completely saturated.  She denies any pain or cramping.  Has not had any more recent bleeding.  Endorses a good appetite.  Denies any nausea or emesis.  Reports normal bowel bladder function.  Denies any recent weight changes.  Last hemoglobin A1c in 10/2022 was 6.4%.  PAST MEDICAL HISTORY:  Past Medical History:  Diagnosis Date   Anxiety    generalized   Chronic abdominal pain    Chronic foot pain    Chronic headache    Cyst    Hearing loss    Hypertension    Hypothyroidism    Ischemic bowel disease (HCC)    Joint pain    Mononeuritis multiplex    Nasal bleeding    Rash    Unspecified hereditary and idiopathic peripheral neuropathy    Wegener's granulomatosis    Wegener's syndrome      PAST SURGICAL HISTORY:  Past Surgical History:  Procedure Laterality Date   TUBAL LIGATION  1992    OB/GYN HISTORY:  OB History  Gravida Para Term Preterm AB Living  3 0 0 0 0 2  SAB IAB Ectopic Multiple Live Births  0 0 0 0 0    # Outcome Date GA Lbr Len/2nd Weight Sex Type Anes PTL Lv  3 Gravida           2 Gravida           1 Gravida             No LMP recorded. Patient is postmenopausal.  Age at menarche: 64  Age at menopause: see HPI Hx of HRT: yes, until  recently was on estrogen and Prometrium Hx of STDs: denies Last pap: 03/2021 - NIML, HR HPV negative History of abnormal pap smears: denies  SCREENING STUDIES:  Last mammogram: 2024  Last colonoscopy: 2022  MEDICATIONS: Outpatient Encounter Medications as of 06/05/2023  Medication Sig   amLODipine (NORVASC) 10 MG tablet Take 10 mg by mouth daily.   azaTHIOprine (IMURAN) 50 MG tablet Take 2 tablets (100 mg total) by mouth 2 (two) times daily.   fentaNYL (DURAGESIC) 25 MCG/HR Place onto the skin.   olmesartan (BENICAR) 20 MG tablet Take 20 mg by mouth daily.   ondansetron (ZOFRAN-ODT) 4 MG disintegrating tablet Take by mouth.   senna-docusate (SENOKOT-S) 8.6-50 MG tablet Take 2 tablets by mouth at bedtime. For AFTER surgery, do not take if having diarrhea   tapentadol HCl (NUCYNTA) 75 MG tablet Take 75 mg by mouth 3 (three) times daily as needed (pain).    [DISCONTINUED] lisinopril (PRINIVIL,ZESTRIL) 20 MG tablet Take 1 tablet (20 mg total) by mouth daily. (Patient not taking: Reported on 06/03/2023)   [DISCONTINUED] TAVNEOS 10 MG CAPS Take by mouth. (Patient not taking: Reported on 06/03/2023)   No facility-administered encounter medications on file as of 06/05/2023.    ALLERGIES:  No Known Allergies   FAMILY HISTORY:  Family History  Problem Relation Age of Onset   Diabetes Mother    Hypertension Mother    Breast cancer Mother    Diabetes Sister    Hypertension Sister    Lung cancer Paternal Aunt    Colon cancer Neg Hx    Prostate cancer Neg Hx    Ovarian cancer Neg Hx    Endometrial cancer Neg Hx    Pancreatic cancer Neg Hx      SOCIAL HISTORY:  Social Connections: Socially Integrated (02/27/2022)   Received from [facility]   Social Network    How would you rate your social network (family, work, friends)?: Good participation with social networks    REVIEW OF SYSTEMS:  + hot flashes, frequency at night Denies appetite changes, fevers, chills, fatigue, unexplained  weight changes. Denies hearing loss, neck lumps or masses, mouth sores, ringing in ears or voice changes. Denies cough or wheezing.  Denies shortness of breath. Denies chest pain or palpitations. Denies leg swelling. Denies abdominal distention, pain, blood in stools, constipation, diarrhea, nausea, vomiting, or early satiety. Denies pain with intercourse, dysuria, hematuria or incontinence. Denies pelvic pain, vaginal bleeding or vaginal discharge.   Denies joint pain, back pain or muscle pain/cramps. Denies itching, rash, or wounds. Denies dizziness, headaches, numbness or seizures. Denies swollen lymph nodes or glands, denies easy bruising or bleeding. Denies anxiety, depression, confusion, or decreased concentration.  Physical Exam:  Vital Signs for this encounter:  Blood pressure (!) 157/72, pulse 86, temperature 98.7 F (37.1 C), temperature source Oral, height 5\' 1"  (1.549 m), weight 184 lb 6.4 oz (83.6 kg), SpO2 100%. Body mass index is 34.84 kg/m. General:  Alert, oriented, no acute distress.  HEENT: Normocephalic, atraumatic. Sclera anicteric.  Chest: Clear to auscultation bilaterally. No wheezes, rhonchi, or rales. Cardiovascular: Regular rate and rhythm, no murmurs, rubs, or gallops.  Abdomen: Normoactive bowel sounds. Soft, nondistended, nontender to palpation. No masses or hepatosplenomegaly appreciated. No palpable fluid wave.  Extremities: Grossly normal range of motion. Warm, well perfused. No edema bilaterally.  Skin: No rashes or lesions.  Lymphatics: No cervical, supraclavicular, or inguinal adenopathy.  GU:  Normal external female genitalia. No lesions. No discharge or bleeding.             Bladder/urethra:  No lesions or masses, well supported bladder             Vagina: Well-rugated, no lesions.             Cervix: Normal appearing, no lesions.             Uterus:  Small, mobile, no parametrial involvement or nodularity.             Adnexa: No masses.  Rectal:  Deferred although significant stool burden appreciated on vaginal exam.  LABORATORY AND RADIOLOGIC DATA:  Outside medical records were reviewed to synthesize the above history, along with the history and physical obtained during the visit.   Lab Results  Component Value Date   WBC 8.0 12/20/2016   HGB 11.8 (L) 12/20/2016   HCT 36.9 12/20/2016   PLT 341 12/20/2016   GLUCOSE 92 12/20/2016   ALT 11 (L) 12/20/2016   AST 23 12/20/2016   NA 139 12/20/2016   K 3.6 12/20/2016   CL 102 12/20/2016   CREATININE 0.70 12/20/2016   BUN 13 12/20/2016   CO2 25 12/20/2016   TSH 0.49 08/17/2015

## 2023-06-04 NOTE — H&P (View-Only) (Signed)
 GYNECOLOGIC ONCOLOGY NEW PATIENT CONSULTATION   Patient Name: Laura [name]  Patient Age: 58 y.o. 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    Patient desires to proceed with surgical management.    The patient is a suitable candidate for hysterectomy via a minimally invasive approach to surgery.  Given that she is postmenopausal, a bilateral salpingo-oophorectomy is also recommended.  We reviewed that robotic assistance would be used to complete the surgery.  We discussed that endometrial cancer is detected in about 40% of final uterine pathology specimens from patients with EIN.  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   Discussed 3 options in terms of plan for surgery.  The first would be to send the uterus for intraoperative frozen pathology.  If cancer is identified at the time of surgery, additional procedures including lymph node evaluation, is recommended.  The second option we discussed would be to proceed with injection of ICG and perform sentinel lymph node mapping although wait to remove sentinel lymph nodes until the uterus has been sent for frozen.  This would help prevent the need for unnecessary lymph node removal if no cancer diagnosed but allow for sentinel lymph node biopsy if lymph node evaluation is indicated.  The third option we discussed would be to seed with ICG injection with sentinel lymph node biopsy at the start of the procedure followed by hysterectomy.  I reviewed that this would mean overtreatment if  no cancer ultimately diagnosed.  After discussing the 3 options, the patient would like to proceed with ICG injection with sentinel lymph node biopsy and total hysterectomy with bilateral salpingo-oophorectomy.  We reviewed the sentinel lymph node technique. Risks and benefits of sentinel lymph node biopsy was reviewed. We reviewed the technique and ICG dye. The patient DOES NOT have an iodine allergy or known liver dysfunction. We reviewed the false negative rate (0.4%), and that 3% of patients with metastatic disease will not have it detected by SLN biopsy in endometrial cancer. A low risk of allergic reaction to the dye, <0.2% for ICG, has been reported. We also discussed that in the case of failed mapping, which occurs 40% of the time, a bilateral or unilateral lymphadenectomy will be performed at the surgeon's discretion.   Potential benefits of sentinel nodes including a higher detection rate for metastasis due to ultrastaging and potential reduction in operative morbidity. However, there remains uncertainty as to the role for treatment of micrometastatic disease. Further, the benefit of operative morbidity associated with the SLN technique in endometrial cancer is not yet completely known. In other patient populations (e.g. the cervical cancer population) there has been observed reductions in morbidity with SLN biopsy compared to pelvic lymphadenectomy. Lymphedema, nerve dysfunction and lymphocysts are all potential risks with the SLN technique as with complete lymphadenectomy. Additional risks to the patient include the risk of damage to an internal organ while operating in an altered view (e.g. the black and white image of the robotic fluorescence imaging mode).   We reviewed the plan for a robotic assisted hysterectomy, bilateral salpingo-oophorectomy, sentinel lymph node evaluation, possible lymph node dissection, possible laparotomy. The risks of surgery  were discussed in detail and she understands  these to include infection; wound separation; hernia; vaginal cuff separation, injury to adjacent organs such as bowel, bladder, blood vessels, ureters and nerves; bleeding which may require blood transfusion; anesthesia risk; thromboembolic events; possible death; unforeseen complications; possible need for re-exploration; medical complications such as heart attack, stroke, pleural effusion and pneumonia; and, if full lymphadenectomy is performed the risk of lymphedema and lymphocyst. The patient will receive DVT and antibiotic prophylaxis as indicated. She voiced a clear understanding. She had the opportunity to ask questions. Perioperative instructions were reviewed with her. Prescriptions for post-op medications were sent to her pharmacy of choice.  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  She follows with rheumatology at [facility].  We will reach out about her immunosuppressive medication and holding this in the perioperative period.  A copy of this note was sent to the patient's referring provider.   65 minutes of total time was spent for this patient encounter, including preparation, face-to-face counseling with the patient and coordination of care, and documentation of the encounter.  Eugene Garnet, MD  Division of Gynecologic Oncology  Department of Obstetrics and Gynecology  [facility]  ___________________________________________  Chief Complaint: Chief Complaint  Patient presents with   EIN    History of Present Illness:  Laura [name] is a 58 y.o. y.o. female who is seen in consultation at the request of Wyline Beady, NP for an evaluation of endometrial intra-epithelial neoplasia.  Patient developed postmenopausal bleeding.  She was seen in January of this year and reported bleeding that started in December that lasted weeks.  Endorsed several cycles over the last year. Her Prometrium was increased to 200 mg as part of her  HRT regimen.  Bleeding stopped for several days and then restarted. 04/01/23: Pelvic ultrasound exam at the [facility] imaging revealed a uterus with normal size measurements, no myometrial masses.  Endometrium 7-8 mm, avascular.  Bilateral ovaries normal in appearance.  No free fluid. 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  Patient reports doing well.  She endorses since being diagnosed with her Wegener's syndrome, in her late 54s, that she has had menses once or twice a year.  She began bleeding in November and only stopped bleeding last month.  She describes the bleeding as heavy, using 5 tampons and 3 pads on her heaviest days which were completely saturated.  She denies any pain or cramping.  Has not had any more recent bleeding.  Endorses a good appetite.  Denies any nausea or emesis.  Reports normal bowel bladder function.  Denies any recent weight changes.  Last hemoglobin A1c in 10/2022 was 6.4%.  PAST MEDICAL HISTORY:  Past Medical History:  Diagnosis Date   Anxiety    generalized   Chronic abdominal pain    Chronic foot pain    Chronic headache    Cyst    Hearing loss    Hypertension    Hypothyroidism    Ischemic bowel disease (HCC)    Joint pain    Mononeuritis multiplex    Nasal bleeding    Rash    Unspecified hereditary and idiopathic peripheral neuropathy    Wegener's granulomatosis    Wegener's syndrome      PAST SURGICAL HISTORY:  Past Surgical History:  Procedure Laterality Date   TUBAL LIGATION  1992    OB/GYN HISTORY:  OB History  Gravida Para Term Preterm AB Living  3 0 0 0 0 2  SAB IAB Ectopic Multiple Live Births  0 0 0 0 0    # Outcome Date GA Lbr Len/2nd Weight Sex Type Anes PTL Lv  3 Gravida           2 Gravida           1 Gravida             No LMP recorded. Patient is postmenopausal.  Age at menarche: 64  Age at menopause: see HPI Hx of HRT: yes, until  recently was on estrogen and Prometrium Hx of STDs: denies Last pap: 03/2021 - NIML, HR HPV negative History of abnormal pap smears: denies  SCREENING STUDIES:  Last mammogram: 2024  Last colonoscopy: 2022  MEDICATIONS: Outpatient Encounter Medications as of 06/05/2023  Medication Sig   amLODipine (NORVASC) 10 MG tablet Take 10 mg by mouth daily.   azaTHIOprine (IMURAN) 50 MG tablet Take 2 tablets (100 mg total) by mouth 2 (two) times daily.   fentaNYL (DURAGESIC) 25 MCG/HR Place onto the skin.   olmesartan (BENICAR) 20 MG tablet Take 20 mg by mouth daily.   ondansetron (ZOFRAN-ODT) 4 MG disintegrating tablet Take by mouth.   senna-docusate (SENOKOT-S) 8.6-50 MG tablet Take 2 tablets by mouth at bedtime. For AFTER surgery, do not take if having diarrhea   tapentadol HCl (NUCYNTA) 75 MG tablet Take 75 mg by mouth 3 (three) times daily as needed (pain).    [DISCONTINUED] lisinopril (PRINIVIL,ZESTRIL) 20 MG tablet Take 1 tablet (20 mg total) by mouth daily. (Patient not taking: Reported on 06/03/2023)   [DISCONTINUED] TAVNEOS 10 MG CAPS Take by mouth. (Patient not taking: Reported on 06/03/2023)   No facility-administered encounter medications on file as of 06/05/2023.    ALLERGIES:  No Known Allergies   FAMILY HISTORY:  Family History  Problem Relation Age of Onset   Diabetes Mother    Hypertension Mother    Breast cancer Mother    Diabetes Sister    Hypertension Sister    Lung cancer Paternal Aunt    Colon cancer Neg Hx    Prostate cancer Neg Hx    Ovarian cancer Neg Hx    Endometrial cancer Neg Hx    Pancreatic cancer Neg Hx      SOCIAL HISTORY:  Social Connections: Socially Integrated (02/27/2022)   Received from [facility]   Social Network    How would you rate your social network (family, work, friends)?: Good participation with social networks    REVIEW OF SYSTEMS:  + hot flashes, frequency at night Denies appetite changes, fevers, chills, fatigue, unexplained  weight changes. Denies hearing loss, neck lumps or masses, mouth sores, ringing in ears or voice changes. Denies cough or wheezing.  Denies shortness of breath. Denies chest pain or palpitations. Denies leg swelling. Denies abdominal distention, pain, blood in stools, constipation, diarrhea, nausea, vomiting, or early satiety. Denies pain with intercourse, dysuria, hematuria or incontinence. Denies pelvic pain, vaginal bleeding or vaginal discharge.   Denies joint pain, back pain or muscle pain/cramps. Denies itching, rash, or wounds. Denies dizziness, headaches, numbness or seizures. Denies swollen lymph nodes or glands, denies easy bruising or bleeding. Denies anxiety, depression, confusion, or decreased concentration.  Physical Exam:  Vital Signs for this encounter:  Blood pressure (!) 157/72, pulse 86, temperature 98.7 F (37.1 C), temperature source Oral, height 5\' 1"  (1.549 m), weight 184 lb 6.4 oz (83.6 kg), SpO2 100%. Body mass index is 34.84 kg/m. General:  Alert, oriented, no acute distress.  HEENT: Normocephalic, atraumatic. Sclera anicteric.  Chest: Clear to auscultation bilaterally. No wheezes, rhonchi, or rales. Cardiovascular: Regular rate and rhythm, no murmurs, rubs, or gallops.  Abdomen: Normoactive bowel sounds. Soft, nondistended, nontender to palpation. No masses or hepatosplenomegaly appreciated. No palpable fluid wave.  Extremities: Grossly normal range of motion. Warm, well perfused. No edema bilaterally.  Skin: No rashes or lesions.  Lymphatics: No cervical, supraclavicular, or inguinal adenopathy.  GU:  Normal external female genitalia. No lesions. No discharge or bleeding.             Bladder/urethra:  No lesions or masses, well supported bladder             Vagina: Well-rugated, no lesions.             Cervix: Normal appearing, no lesions.             Uterus:  Small, mobile, no parametrial involvement or nodularity.             Adnexa: No masses.  Rectal:  Deferred although significant stool burden appreciated on vaginal exam.  LABORATORY AND RADIOLOGIC DATA:  Outside medical records were reviewed to synthesize the above history, along with the history and physical obtained during the visit.   Lab Results  Component Value Date   WBC 8.0 12/20/2016   HGB 11.8 (L) 12/20/2016   HCT 36.9 12/20/2016   PLT 341 12/20/2016   GLUCOSE 92 12/20/2016   ALT 11 (L) 12/20/2016   AST 23 12/20/2016   NA 139 12/20/2016   K 3.6 12/20/2016   CL 102 12/20/2016   CREATININE 0.70 12/20/2016   BUN 13 12/20/2016   CO2 25 12/20/2016   TSH 0.49 08/17/2015

## 2023-06-05 ENCOUNTER — Other Ambulatory Visit: Payer: Self-pay

## 2023-06-05 ENCOUNTER — Inpatient Hospital Stay (HOSPITAL_BASED_OUTPATIENT_CLINIC_OR_DEPARTMENT_OTHER): Admitting: Gynecologic Oncology

## 2023-06-05 ENCOUNTER — Telehealth: Payer: Self-pay | Admitting: *Deleted

## 2023-06-05 ENCOUNTER — Inpatient Hospital Stay: Attending: Gynecologic Oncology | Admitting: Gynecologic Oncology

## 2023-06-05 ENCOUNTER — Encounter: Payer: Self-pay | Admitting: Gynecologic Oncology

## 2023-06-05 VITALS — BP 157/72 | HR 86 | Temp 98.7°F | Ht 61.0 in | Wt 184.4 lb

## 2023-06-05 DIAGNOSIS — Z79631 Long term (current) use of antimetabolite agent: Secondary | ICD-10-CM | POA: Diagnosis not present

## 2023-06-05 DIAGNOSIS — N950 Postmenopausal bleeding: Secondary | ICD-10-CM | POA: Diagnosis not present

## 2023-06-05 DIAGNOSIS — G589 Mononeuropathy, unspecified: Secondary | ICD-10-CM | POA: Diagnosis not present

## 2023-06-05 DIAGNOSIS — Z79899 Other long term (current) drug therapy: Secondary | ICD-10-CM | POA: Insufficient documentation

## 2023-06-05 DIAGNOSIS — N8502 Endometrial intraepithelial neoplasia [EIN]: Secondary | ICD-10-CM | POA: Diagnosis present

## 2023-06-05 DIAGNOSIS — I10 Essential (primary) hypertension: Secondary | ICD-10-CM | POA: Insufficient documentation

## 2023-06-05 DIAGNOSIS — F419 Anxiety disorder, unspecified: Secondary | ICD-10-CM | POA: Diagnosis not present

## 2023-06-05 DIAGNOSIS — Z6834 Body mass index (BMI) 34.0-34.9, adult: Secondary | ICD-10-CM | POA: Insufficient documentation

## 2023-06-05 DIAGNOSIS — E66811 Obesity, class 1: Secondary | ICD-10-CM | POA: Diagnosis not present

## 2023-06-05 DIAGNOSIS — E039 Hypothyroidism, unspecified: Secondary | ICD-10-CM | POA: Diagnosis not present

## 2023-06-05 DIAGNOSIS — Z801 Family history of malignant neoplasm of trachea, bronchus and lung: Secondary | ICD-10-CM | POA: Insufficient documentation

## 2023-06-05 DIAGNOSIS — M3130 Wegener's granulomatosis without renal involvement: Secondary | ICD-10-CM | POA: Insufficient documentation

## 2023-06-05 DIAGNOSIS — K559 Vascular disorder of intestine, unspecified: Secondary | ICD-10-CM | POA: Diagnosis not present

## 2023-06-05 DIAGNOSIS — Z803 Family history of malignant neoplasm of breast: Secondary | ICD-10-CM | POA: Diagnosis not present

## 2023-06-05 DIAGNOSIS — Z79624 Long term (current) use of inhibitors of nucleotide synthesis: Secondary | ICD-10-CM | POA: Diagnosis not present

## 2023-06-05 MED ORDER — SENNOSIDES-DOCUSATE SODIUM 8.6-50 MG PO TABS: 2.0000 | ORAL_TABLET | Freq: Every day | ORAL | 1 refills | Status: AC

## 2023-06-05 NOTE — Telephone Encounter (Signed)
 Attempted to reach [facility] in [place] in regards to post op pain management for patient's scheduled surgery on 5/1. Left voicemail requesting call back to [phone].

## 2023-06-05 NOTE — Patient Instructions (Addendum)
 Preparing for your Surgery  Plan for surgery on Jun 26, 2023 with Dr. Eugene Garnet at [facility]. You will be scheduled for robotic assisted total laparoscopic hysterectomy (removal of the uterus and cervix), bilateral salpingo-oophorectomy (removal of both ovaries and fallopian tubes), sentinel lymph node biopsy, possible lymph node dissection, possible laparotomy (larger incision on your abdomen if needed).  You will need to start taking medication to keep your bowels moving regularly.  Pre-operative Testing -You will receive a phone call from presurgical testing at [facility] to arrange for a pre-operative appointment and lab work.  -Bring your insurance card, copy of an advanced directive if applicable, medication list  -At that visit, you will be asked to sign a consent for a possible blood transfusion in case a transfusion becomes necessary during surgery.  The need for a blood transfusion is rare but having consent is a necessary part of your care.     -You should not be taking blood thinners or aspirin at least ten days prior to surgery unless instructed by your surgeon.  -Do not take supplements such as fish oil (omega 3), red yeast rice, turmeric before your surgery. STOP TAKING AT LEAST 10 DAYS BEFORE SURGERY. You want to avoid medications with aspirin in them including headache powders such as BC or Goody's), Excedrin migraine.  WE WILL REACH OUT TO YOUR NEPHROLOGIST ABOUT YOUR MEDICATIONS BEFORE SURGERY AND IF AND WHEN YOU NEED TO HOLD CERTAIN MEDS.  Day Before Surgery at Home -You will be asked to take in a light diet the day before surgery. You will be advised you can have clear liquids up until 3 hours before your surgery.    Eat a light diet the day before surgery.  Examples including soups, broths, toast, yogurt, mashed potatoes.  AVOID GAS PRODUCING FOODS AND BEVERAGES. Things to avoid include carbonated beverages (fizzy beverages, sodas), raw fruits  and raw vegetables (uncooked), or beans.   If your bowels are filled with gas, your surgeon will have difficulty visualizing your pelvic organs which increases your surgical risks.  Your role in recovery Your role is to become active as soon as directed by your doctor, while still giving yourself time to heal.  Rest when you feel tired. You will be asked to do the following in order to speed your recovery:  - Cough and breathe deeply. This helps to clear and expand your lungs and can prevent pneumonia after surgery.  - STAY ACTIVE WHEN YOU GET HOME. Do mild physical activity. Walking or moving your legs help your circulation and body functions return to normal. Do not try to get up or walk alone the first time after surgery.   -If you develop swelling on one leg or the other, pain in the back of your leg, redness/warmth in one of your legs, please call the office or go to the Emergency Room to have a doppler to rule out a blood clot. For shortness of breath, chest pain-seek care in the Emergency Room as soon as possible. - Actively manage your pain. Managing your pain lets you move in comfort. We will ask you to rate your pain on a scale of zero to 10. It is your responsibility to tell your doctor or nurse where and how much you hurt so your pain can be treated.  Special Considerations -If you are diabetic, you may be placed on insulin after surgery to have closer control over your blood sugars to promote healing and recovery.  This does not mean that you will be discharged on insulin.  If applicable, your oral antidiabetics will be resumed when you are tolerating a solid diet.  -Your final pathology results from surgery should be available around one week after surgery and the results will be relayed to you when available.  -FMLA forms can be faxed to [phone] and please allow 5-7 business days for completion.  Pain Management After Surgery -You can continue on your chronic pain regimen.    Bowel Regimen -You will be prescribed Sennakot-S to take nightly to prevent constipation especially if you are taking the narcotic pain medication intermittently.  It is important to prevent constipation and drink adequate amounts of liquids. You can stop taking this medication when you are not taking pain medication and you are back on your normal bowel routine.  Risks of Surgery Risks of surgery are low but include bleeding, infection, damage to surrounding structures, re-operation, blood clots, and very rarely death.   Blood Transfusion Information (For the consent to be signed before surgery)  We will be checking your blood type before surgery so in case of emergencies, we will know what type of blood you would need.                                            WHAT IS A BLOOD TRANSFUSION?  A transfusion is the replacement of blood or some of its parts. Blood is made up of multiple cells which provide different functions. Red blood cells carry oxygen and are used for blood loss replacement. White blood cells fight against infection. Platelets control bleeding. Plasma helps clot blood. Other blood products are available for specialized needs, such as hemophilia or other clotting disorders. BEFORE THE TRANSFUSION  Who gives blood for transfusions?  You may be able to donate blood to be used at a later date on yourself (autologous donation). Relatives can be asked to donate blood. This is generally not any safer than if you have received blood from a stranger. The same precautions are taken to ensure safety when a relative's blood is donated. Healthy volunteers who are fully evaluated to make sure their blood is safe. This is blood bank blood. Transfusion therapy is the safest it has ever been in the practice of medicine. Before blood is taken from a donor, a complete history is taken to make sure that person has no history of diseases nor engages in risky social behavior (examples are  intravenous drug use or sexual activity with multiple partners). The donor's travel history is screened to minimize risk of transmitting infections, such as malaria. The donated blood is tested for signs of infectious diseases, such as HIV and hepatitis. The blood is then tested to be sure it is compatible with you in order to minimize the chance of a transfusion reaction. If you or a relative donates blood, this is often done in anticipation of surgery and is not appropriate for emergency situations. It takes many days to process the donated blood. RISKS AND COMPLICATIONS Although transfusion therapy is very safe and saves many lives, the main dangers of transfusion include:  Getting an infectious disease. Developing a transfusion reaction. This is an allergic reaction to something in the blood you were given. Every precaution is taken to prevent this. The decision to have a blood transfusion has been considered carefully by your caregiver before blood  is given. Blood is not given unless the benefits outweigh the risks.  AFTER SURGERY INSTRUCTIONS  Return to work: 4-6 weeks if applicable  Activity: 1. Be up and out of the bed during the day.  Take a nap if needed.  You may walk up steps but be careful and use the hand rail.  Stair climbing will tire you more than you think, you may need to stop part way and rest.   2. No lifting or straining for 6 weeks over 10 pounds. No pushing, pulling, straining for 6 weeks.  3. No driving for 2-95 days when the following criteria have been met: Do not drive if you are taking narcotic pain medicine and make sure that your reaction time has returned.   4. You can shower as soon as the next day after surgery. Shower daily.  Use your regular soap and water (not directly on the incision) and pat your incision(s) dry afterwards; don't rub.  No tub baths or submerging your body in water until cleared by your surgeon. If you have the soap that was given to you by  pre-surgical testing that was used before surgery, you do not need to use it afterwards because this can irritate your incisions.   5. No sexual activity and nothing in the vagina for 10-12 weeks.  6. You may experience a small amount of clear drainage from your incisions, which is normal.  If the drainage persists, increases, or changes color please call the office.  7. Do not use creams, lotions, or ointments such as neosporin on your incisions after surgery until advised by your surgeon because they can cause removal of the dermabond glue on your incisions.    8. You may experience vaginal spotting after surgery or when the stitches at the top of the vagina begin to dissolve.  The spotting is normal but if you experience heavy bleeding, call our office.  9. Continue on your chronic pain regimen.  Diet: 1. Low sodium Heart Healthy Diet is recommended but you are cleared to resume your normal (before surgery) diet after your procedure.  2. It is safe to use a laxative, such as Miralax or Colace, if you have difficulty moving your bowels before surgery. You have been prescribed Sennakot-S to take at bedtime every evening after surgery to keep bowel movements regular and to prevent constipation.    Wound Care: 1. Keep clean and dry.  Shower daily.  Reasons to call the Doctor: Fever - Oral temperature greater than 100.4 degrees Fahrenheit Foul-smelling vaginal discharge Difficulty urinating Nausea and vomiting Increased pain at the site of the incision that is unrelieved with pain medicine. Difficulty breathing with or without chest pain New calf pain especially if only on one side Sudden, continuing increased vaginal bleeding with or without clots.   Contacts: For questions or concerns you should contact:  Dr. Eugene Garnet at [phone]  Warner Mccreedy, NP at [phone]  After Hours: call [phone] and have the GYN Oncologist paged/contacted (after 5 pm or on the  weekends). You will speak with an after hours RN and let he or she know you have had surgery.  Messages sent via mychart are for non-urgent matters and are not responded to after hours so for urgent needs, please call the after hours number.

## 2023-06-06 ENCOUNTER — Telehealth: Payer: Self-pay | Admitting: *Deleted

## 2023-06-06 NOTE — Telephone Encounter (Signed)
 Per provider fax records and surgical optimization form to the following offices   Dr Sharmon Revere [phone]

## 2023-06-06 NOTE — Progress Notes (Signed)
 Patient here for new patient consultation with Dr. Pricilla Holm and for a pre-operative appointment prior to her scheduled surgery on 06/26/23. She is scheduled for robotic assisted total laparoscopic hysterectomy, bilateral salpingo-oophorectomy, sentinel lymph node biopsy, possible lymph node dissection, possible laparotomy. The surgery was discussed in detail.  See after visit summary for additional details.    She is advised to continue her current pain regimen. Her pain management provider will be notified of her upcoming surgery. Prescribed sennakot to be used after surgery and to hold if having loose stools.  Discussed bowel regimen in detail and she is to start this now.     Discussed measures to take at home to prevent DVT including frequent mobility.  Reportable signs and symptoms of DVT discussed. Post-operative instructions discussed and expectations for after surgery. Incisional care discussed as well including reportable signs and symptoms including erythema, drainage, wound separation.     10 minutes spent preparing information and with the patient.  Verbalizing understanding of material discussed. No needs or concerns voiced at the end of the visit.   Advised patient to call for any needs.    This appointment is included in the global surgical bundle as pre-operative teaching and has no charge.

## 2023-06-06 NOTE — Telephone Encounter (Signed)
 2nd attempt to reach [facility] at [phone], Left voicemail requesting call back.

## 2023-06-06 NOTE — Patient Instructions (Signed)
 Preparing for your Surgery  Plan for surgery on Jun 26, 2023 with Dr. Eugene Garnet at [facility]. You will be scheduled for robotic assisted total laparoscopic hysterectomy (removal of the uterus and cervix), bilateral salpingo-oophorectomy (removal of both ovaries and fallopian tubes), sentinel lymph node biopsy, possible lymph node dissection, possible laparotomy (larger incision on your abdomen if needed).  You will need to start taking medication to keep your bowels moving regularly.  Pre-operative Testing -You will receive a phone call from presurgical testing at [facility] to arrange for a pre-operative appointment and lab work.  -Bring your insurance card, copy of an advanced directive if applicable, medication list  -At that visit, you will be asked to sign a consent for a possible blood transfusion in case a transfusion becomes necessary during surgery.  The need for a blood transfusion is rare but having consent is a necessary part of your care.     -You should not be taking blood thinners or aspirin at least ten days prior to surgery unless instructed by your surgeon.  -Do not take supplements such as fish oil (omega 3), red yeast rice, turmeric before your surgery. STOP TAKING AT LEAST 10 DAYS BEFORE SURGERY. You want to avoid medications with aspirin in them including headache powders such as BC or Goody's), Excedrin migraine.  WE WILL REACH OUT TO YOUR NEPHROLOGIST ABOUT YOUR MEDICATIONS BEFORE SURGERY AND IF AND WHEN YOU NEED TO HOLD CERTAIN MEDS.  Day Before Surgery at Home -You will be asked to take in a light diet the day before surgery. You will be advised you can have clear liquids up until 3 hours before your surgery.    Eat a light diet the day before surgery.  Examples including soups, broths, toast, yogurt, mashed potatoes.  AVOID GAS PRODUCING FOODS AND BEVERAGES. Things to avoid include carbonated beverages (fizzy beverages, sodas), raw fruits  and raw vegetables (uncooked), or beans.   If your bowels are filled with gas, your surgeon will have difficulty visualizing your pelvic organs which increases your surgical risks.  Your role in recovery Your role is to become active as soon as directed by your doctor, while still giving yourself time to heal.  Rest when you feel tired. You will be asked to do the following in order to speed your recovery:  - Cough and breathe deeply. This helps to clear and expand your lungs and can prevent pneumonia after surgery.  - STAY ACTIVE WHEN YOU GET HOME. Do mild physical activity. Walking or moving your legs help your circulation and body functions return to normal. Do not try to get up or walk alone the first time after surgery.   -If you develop swelling on one leg or the other, pain in the back of your leg, redness/warmth in one of your legs, please call the office or go to the Emergency Room to have a doppler to rule out a blood clot. For shortness of breath, chest pain-seek care in the Emergency Room as soon as possible. - Actively manage your pain. Managing your pain lets you move in comfort. We will ask you to rate your pain on a scale of zero to 10. It is your responsibility to tell your doctor or nurse where and how much you hurt so your pain can be treated.  Special Considerations -If you are diabetic, you may be placed on insulin after surgery to have closer control over your blood sugars to promote healing and recovery.  This does not mean that you will be discharged on insulin.  If applicable, your oral antidiabetics will be resumed when you are tolerating a solid diet.  -Your final pathology results from surgery should be available around one week after surgery and the results will be relayed to you when available.  -FMLA forms can be faxed to [phone] and please allow 5-7 business days for completion.  Pain Management After Surgery -You can continue on your chronic pain regimen.    Bowel Regimen -You will be prescribed Sennakot-S to take nightly to prevent constipation especially if you are taking the narcotic pain medication intermittently.  It is important to prevent constipation and drink adequate amounts of liquids. You can stop taking this medication when you are not taking pain medication and you are back on your normal bowel routine.  Risks of Surgery Risks of surgery are low but include bleeding, infection, damage to surrounding structures, re-operation, blood clots, and very rarely death.   Blood Transfusion Information (For the consent to be signed before surgery)  We will be checking your blood type before surgery so in case of emergencies, we will know what type of blood you would need.                                            WHAT IS A BLOOD TRANSFUSION?  A transfusion is the replacement of blood or some of its parts. Blood is made up of multiple cells which provide different functions. Red blood cells carry oxygen and are used for blood loss replacement. White blood cells fight against infection. Platelets control bleeding. Plasma helps clot blood. Other blood products are available for specialized needs, such as hemophilia or other clotting disorders. BEFORE THE TRANSFUSION  Who gives blood for transfusions?  You may be able to donate blood to be used at a later date on yourself (autologous donation). Relatives can be asked to donate blood. This is generally not any safer than if you have received blood from a stranger. The same precautions are taken to ensure safety when a relative's blood is donated. Healthy volunteers who are fully evaluated to make sure their blood is safe. This is blood bank blood. Transfusion therapy is the safest it has ever been in the practice of medicine. Before blood is taken from a donor, a complete history is taken to make sure that person has no history of diseases nor engages in risky social behavior (examples are  intravenous drug use or sexual activity with multiple partners). The donor's travel history is screened to minimize risk of transmitting infections, such as malaria. The donated blood is tested for signs of infectious diseases, such as HIV and hepatitis. The blood is then tested to be sure it is compatible with you in order to minimize the chance of a transfusion reaction. If you or a relative donates blood, this is often done in anticipation of surgery and is not appropriate for emergency situations. It takes many days to process the donated blood. RISKS AND COMPLICATIONS Although transfusion therapy is very safe and saves many lives, the main dangers of transfusion include:  Getting an infectious disease. Developing a transfusion reaction. This is an allergic reaction to something in the blood you were given. Every precaution is taken to prevent this. The decision to have a blood transfusion has been considered carefully by your caregiver before blood  is given. Blood is not given unless the benefits outweigh the risks.  AFTER SURGERY INSTRUCTIONS  Return to work: 4-6 weeks if applicable  Activity: 1. Be up and out of the bed during the day.  Take a nap if needed.  You may walk up steps but be careful and use the hand rail.  Stair climbing will tire you more than you think, you may need to stop part way and rest.   2. No lifting or straining for 6 weeks over 10 pounds. No pushing, pulling, straining for 6 weeks.  3. No driving for 2-95 days when the following criteria have been met: Do not drive if you are taking narcotic pain medicine and make sure that your reaction time has returned.   4. You can shower as soon as the next day after surgery. Shower daily.  Use your regular soap and water (not directly on the incision) and pat your incision(s) dry afterwards; don't rub.  No tub baths or submerging your body in water until cleared by your surgeon. If you have the soap that was given to you by  pre-surgical testing that was used before surgery, you do not need to use it afterwards because this can irritate your incisions.   5. No sexual activity and nothing in the vagina for 10-12 weeks.  6. You may experience a small amount of clear drainage from your incisions, which is normal.  If the drainage persists, increases, or changes color please call the office.  7. Do not use creams, lotions, or ointments such as neosporin on your incisions after surgery until advised by your surgeon because they can cause removal of the dermabond glue on your incisions.    8. You may experience vaginal spotting after surgery or when the stitches at the top of the vagina begin to dissolve.  The spotting is normal but if you experience heavy bleeding, call our office.  9. Continue on your chronic pain regimen.  Diet: 1. Low sodium Heart Healthy Diet is recommended but you are cleared to resume your normal (before surgery) diet after your procedure.  2. It is safe to use a laxative, such as Miralax or Colace, if you have difficulty moving your bowels before surgery. You have been prescribed Sennakot-S to take at bedtime every evening after surgery to keep bowel movements regular and to prevent constipation.    Wound Care: 1. Keep clean and dry.  Shower daily.  Reasons to call the Doctor: Fever - Oral temperature greater than 100.4 degrees Fahrenheit Foul-smelling vaginal discharge Difficulty urinating Nausea and vomiting Increased pain at the site of the incision that is unrelieved with pain medicine. Difficulty breathing with or without chest pain New calf pain especially if only on one side Sudden, continuing increased vaginal bleeding with or without clots.   Contacts: For questions or concerns you should contact:  Dr. Eugene Garnet at [phone]  Warner Mccreedy, NP at [phone]  After Hours: call [phone] and have the GYN Oncologist paged/contacted (after 5 pm or on the  weekends). You will speak with an after hours RN and let he or she know you have had surgery.  Messages sent via mychart are for non-urgent matters and are not responded to after hours so for urgent needs, please call the after hours number.  for urgent needs, please call the after hours number.

## 2023-06-10 ENCOUNTER — Telehealth: Payer: Self-pay | Admitting: *Deleted

## 2023-06-10 NOTE — Telephone Encounter (Signed)
 3rd attempt to reach [facility] at [phone]. Left voicemail requesting call back.

## 2023-06-10 NOTE — Telephone Encounter (Signed)
 3rd attempt to reach [facility] at [phone]. Left voicemail requesting call back to [phone].

## 2023-06-18 NOTE — Telephone Encounter (Signed)
 Received clearance

## 2023-06-18 NOTE — Patient Instructions (Signed)
 SURGICAL WAITING ROOM VISITATION  Patients having surgery or a procedure may have no more than 2 support people in the waiting area - these visitors may rotate.    Children under the age of 53 must have an adult with them who is not the patient.  Due to an increase in RSV and influenza rates and associated hospitalizations, children ages 11 and under may not visit patients in [facility] hospitals.  Visitors with respiratory illnesses are discouraged from visiting and should remain at home.  If the patient needs to stay at the hospital during part of their recovery, the visitor guidelines for inpatient rooms apply. Pre-op nurse will coordinate an appropriate time for 1 support person to accompany patient in pre-op.  This support person may not rotate.    Please refer to the [facility] website for the visitor guidelines for Inpatients (after your surgery is over and you are in a regular room).       Your procedure is scheduled on:  06/26/2023    Report to [facility] Entr[facility]    Report to admitting at   [phone]   Call this number if you have problems the morning of surgery [phone]   Do not eat food :After Midnight.                          Eat a light diet the day before surgery.  Avoid gas producing foods.  Avoid carbonated beverages.     After Midnight you may have the following liquids until __ 0430____ AM DAY OF SURGERY  Water Non-Citrus Juices (without pulp, NO RED-Apple, White grape, White cranberry) Black Coffee (NO MILK/CREAM OR CREAMERS, sugar ok)  Clear Tea (NO MILK/CREAM OR CREAMERS, sugar ok) regular and decaf                             Plain Jell-O (NO RED)                                           Fruit ices (not with fruit pulp, NO RED)                                     Popsicles (NO RED)                                                               Sports drinks like Gatorade (NO                If you have questions, please contact your  surgeon's office.      Oral Hygiene is also important to reduce your risk of infection.                                    Remember - BRUSH YOUR TEETH THE MORNING OF SURGERY WITH YOUR REGULAR TOOTHPASTE  DENTURES WILL BE REMOVED PRIOR TO SURGERY PLEASE DO NOT APPLY "Poly grip" OR  ADHESIVES!!!   Do NOT smoke after Midnight   Stop all vitamins and herbal supplements 7 days before surgery.   Take these medicines the morning of surgery with A SIP OF WATER:  amlodipine,   DO NOT TAKE ANY ORAL DIABETIC MEDICATIONS DAY OF YOUR SURGERY  Bring CPAP mask and tubing day of surgery.                              You may not have any metal on your body including hair pins, jewelry, and body piercing             Do not wear make-up, lotions, powders, perfumes/cologne, or deodorant  Do not wear nail polish including gel and S&S, artificial/acrylic nails, or any other type of covering on natural nails including finger and toenails. If you have artificial nails, gel coating, etc. that needs to be removed by a nail salon please have this removed prior to surgery or surgery may need to be canceled/ delayed if the surgeon/ anesthesia feels like they are unable to be safely monitored.   Do not shave  48 hours prior to surgery.               Men may shave face and neck.   Do not bring valuables to the hospital. [place] IS NOT             RESPONSIBLE   FOR VALUABLES.   Contacts, glasses, dentures or bridgework may not be worn into surgery.   Bring small overnight bag day of surgery.   DO NOT BRING YOUR HOME MEDICATIONS TO THE HOSPITAL. PHARMACY WILL DISPENSE MEDICATIONS LISTED ON YOUR MEDICATION LIST TO YOU DURING YOUR ADMISSION IN THE HOSPITAL!    Patients discharged on the day of surgery will not be allowed to drive home.  Someone NEEDS to stay with you for the first 24 hours after anesthesia.   Special Instructions: Bring a copy of your healthcare power of attorney and living will documents  the day of surgery if you haven't scanned them before.              Please read over the following fact sheets you were given: IF YOU HAVE QUESTIONS ABOUT YOUR PRE-OP INSTRUCTIONS PLEASE CALL [phone]   If you received a COVID test during your pre-op visit  it is requested that you wear a mask when out in public, stay away from anyone that may not be feeling well and notify your surgeon if you develop symptoms. If you test positive for Covid or have been in contact with anyone that has tested positive in the last 10 days please notify you surgeon.    [place] - Preparing for Surgery Before surgery, you can play an important role.  Because skin is not sterile, your skin needs to be as free of germs as possible.  You can reduce the number of germs on your skin by washing with CHG (chlorahexidine gluconate) soap before surgery.  CHG is an antiseptic cleaner which kills germs and bonds with the skin to continue killing germs even after washing. Please DO NOT use if you have an allergy to CHG or antibacterial soaps.  If your skin becomes reddened/irritated stop using the CHG and inform your nurse when you arrive at Short Stay. Do not shave (including legs and underarms) for at least 48 hours prior to the first CHG shower.  You may shave your face/neck.  Please follow these instructions carefully:  1.  Shower with CHG Soap the night before surgery and the  morning of Surgery.  2.  If you choose to wash your hair, wash your hair first as usual with your  normal  shampoo.  3.  After you shampoo, rinse your hair and body thoroughly to remove the  shampoo.                           4.  Use CHG as you would any other liquid soap.  You can apply chg directly  to the skin and wash                       Gently with a scrungie or clean washcloth.  5.  Apply the CHG Soap to your body ONLY FROM THE NECK DOWN.   Do not use on face/ open                           Wound or open sores. Avoid contact with eyes,  ears mouth and genitals (private parts).                       Wash face,  Genitals (private parts) with your normal soap.             6.  Wash thoroughly, paying special attention to the area where your surgery  will be performed.  7.  Thoroughly rinse your body with warm water from the neck down.  8.  DO NOT shower/wash with your normal soap after using and rinsing off  the CHG Soap.                9.  Pat yourself dry with a clean towel.            10.  Wear clean pajamas.            11.  Place clean sheets on your bed the night of your first shower and do not  sleep with pets. Day of Surgery : Do not apply any lotions/deodorants the morning of surgery.  Please wear clean clothes to the hospital/surgery center.  FAILURE TO FOLLOW THESE INSTRUCTIONS MAY RESULT IN THE CANCELLATION OF YOUR SURGERY PATIENT SIGNATURE_________________________________  NURSE SIGNATURE__________________________________  ________________________________________________________________________

## 2023-06-18 NOTE — Progress Notes (Addendum)
 Anesthesia Review:  PCP: Gregor Learned  Cardiologist :none   PPM/ ICD: Device Orders: Rep Notified:  Chest x-ray : 01/27/23- 1 view  CT Chest- 01/27/23  EKG : 06/20/23  Echo : Stress test: Cardiac Cath :   Activity level: can do a flight of stairs wtihout difficutly  Sleep Study/ CPAP : none  Fasting Blood Sugar :      / Checks Blood Sugar -- times a day:    Blood Thinner/ Instructions /Last Dose: ASA / Instructions/ Last Dose :    PT uses Fentanyl  patch every 3 days.  PT may or may not have this patch on DOS.     HOH in left ear.   CBC done with hgb of 8.7 on 06/20/23 routed to DR Orvil Bland and Cay Cocking on 06/20/23.  Also called and spoke with Jamie in office that CBC results  had been routed to DR Orvil Bland and Cay Cocking.  Carolyn Cisco stated she would let them know.  Sharlyn Deaner made aware of above on 06/20/23. PT has appt with hematology on 06/23/23.

## 2023-06-20 ENCOUNTER — Telehealth: Payer: Self-pay | Admitting: Oncology

## 2023-06-20 ENCOUNTER — Encounter (HOSPITAL_COMMUNITY): Payer: Self-pay

## 2023-06-20 ENCOUNTER — Encounter (HOSPITAL_COMMUNITY)
Admission: RE | Admit: 2023-06-20 | Discharge: 2023-06-20 | Disposition: A | Discharge status: Home / Self Care | Source: Ambulatory Visit | Attending: Gynecologic Oncology | Admitting: Gynecologic Oncology

## 2023-06-20 ENCOUNTER — Other Ambulatory Visit: Payer: Self-pay

## 2023-06-20 ENCOUNTER — Telehealth: Payer: Self-pay

## 2023-06-20 VITALS — BP 162/83 | HR 83 | Temp 98.4°F | Resp 16 | Ht 61.0 in | Wt 179.0 lb

## 2023-06-20 DIAGNOSIS — Z01818 Encounter for other preprocedural examination: Secondary | ICD-10-CM

## 2023-06-20 DIAGNOSIS — I10 Essential (primary) hypertension: Secondary | ICD-10-CM | POA: Insufficient documentation

## 2023-06-20 DIAGNOSIS — Z87891 Personal history of nicotine dependence: Secondary | ICD-10-CM | POA: Diagnosis not present

## 2023-06-20 DIAGNOSIS — N8502 Endometrial intraepithelial neoplasia [EIN]: Secondary | ICD-10-CM | POA: Diagnosis not present

## 2023-06-20 LAB — CBC
HCT: 29.6 % — ABNORMAL LOW (ref 36.0–46.0)
Hemoglobin: 8.7 g/dL — ABNORMAL LOW (ref 12.0–15.0)
MCH: 20.8 pg — ABNORMAL LOW (ref 26.0–34.0)
MCHC: 29.4 g/dL — ABNORMAL LOW (ref 30.0–36.0)
MCV: 70.6 fL — ABNORMAL LOW (ref 80.0–100.0)
Platelets: 471 10*3/uL — ABNORMAL HIGH (ref 150–400)
RBC: 4.19 MIL/uL (ref 3.87–5.11)
RDW: 17.5 % — ABNORMAL HIGH (ref 11.5–15.5)
WBC: 8.6 10*3/uL (ref 4.0–10.5)
nRBC: 0 % (ref 0.0–0.2)

## 2023-06-20 LAB — COMPREHENSIVE METABOLIC PANEL WITH GFR
ALT: 11 U/L (ref 0–44)
AST: 29 U/L (ref 15–41)
Albumin: 4 g/dL (ref 3.5–5.0)
Alkaline Phosphatase: 64 U/L (ref 38–126)
Anion gap: 8 (ref 5–15)
BUN: 9 mg/dL (ref 6–20)
CO2: 23 mmol/L (ref 22–32)
Calcium: 9.3 mg/dL (ref 8.9–10.3)
Chloride: 106 mmol/L (ref 98–111)
Creatinine, Ser: 0.73 mg/dL (ref 0.44–1.00)
GFR, Estimated: >60 mL/min (ref >60)
Glucose, Bld: 82 mg/dL (ref 70–99)
Potassium: 3.6 mmol/L (ref 3.5–5.1)
Sodium: 137 mmol/L (ref 135–145)
Total Bilirubin: 0.4 mg/dL (ref 0.0–1.2)
Total Protein: 7.6 g/dL (ref 6.5–8.1)

## 2023-06-20 NOTE — Telephone Encounter (Signed)
 FMLA forms received for upcoming surgery. Requested information provided and faxed back to [phone].

## 2023-06-20 NOTE — Telephone Encounter (Signed)
 Disability forms received from [employer]. Requested information provided for upcoming surgery and faxed to [phone]

## 2023-06-20 NOTE — Telephone Encounter (Signed)
 Entered in error

## 2023-06-20 NOTE — Telephone Encounter (Signed)
 Called Laura [name] and advised her of message below.  She verbalized agreement and is able to see Dr. Alita Irwin on Monday, 06/23/23.  Let her know the appointment time is 11:30.  She also stated she will stop her Imuran  now.  Discussed she may need to stay overnight after surgery due to instructions from her rheumatologist.

## 2023-06-20 NOTE — Telephone Encounter (Signed)
-----   Message from Suellyn Emory sent at 06/20/2023  3:39 PM EDT -----  ----- Message ----- From: Suellyn Emory, NP Sent: 06/20/2023   3:36 PM EDT To: Louella Rout, RN  Please call this patient and let her know her Hgb is 8.7 on preop labs. Dr. Orvil Bland would like to have her seen by a hematologist before surgery to see if there are any interventions to help her counts. We are looking at a possible appt on this upcoming Monday. Would she be able to do that??  Also her rheumatologist said to stop her imuran  for one week before surgery so that would be to stop taking now.

## 2023-06-23 ENCOUNTER — Inpatient Hospital Stay

## 2023-06-23 VITALS — BP 141/75 | HR 80 | Temp 97.9°F | Resp 17 | Ht 61.0 in | Wt 185.3 lb

## 2023-06-23 DIAGNOSIS — D500 Iron deficiency anemia secondary to blood loss (chronic): Secondary | ICD-10-CM

## 2023-06-23 DIAGNOSIS — N8502 Endometrial intraepithelial neoplasia [EIN]: Secondary | ICD-10-CM | POA: Diagnosis not present

## 2023-06-23 LAB — CBC WITH DIFFERENTIAL (CANCER CENTER ONLY)
Abs Immature Granulocytes: 0.02 10*3/uL (ref 0.00–0.07)
Basophils Absolute: 0.1 10*3/uL (ref 0.0–0.1)
Basophils Relative: 1 %
Eosinophils Absolute: 0.4 10*3/uL (ref 0.0–0.5)
Eosinophils Relative: 5 %
HCT: 26.6 % — ABNORMAL LOW (ref 36.0–46.0)
Hemoglobin: 8.2 g/dL — ABNORMAL LOW (ref 12.0–15.0)
Immature Granulocytes: 0 %
Lymphocytes Relative: 32 %
Lymphs Abs: 2.4 10*3/uL (ref 0.7–4.0)
MCH: 20.6 pg — ABNORMAL LOW (ref 26.0–34.0)
MCHC: 30.8 g/dL (ref 30.0–36.0)
MCV: 66.7 fL — ABNORMAL LOW (ref 80.0–100.0)
Monocytes Absolute: 0.8 10*3/uL (ref 0.1–1.0)
Monocytes Relative: 10 %
Neutro Abs: 3.8 10*3/uL (ref 1.7–7.7)
Neutrophils Relative %: 52 %
Platelet Count: 444 10*3/uL — ABNORMAL HIGH (ref 150–400)
RBC: 3.99 MIL/uL (ref 3.87–5.11)
RDW: 17.3 % — ABNORMAL HIGH (ref 11.5–15.5)
WBC Count: 7.4 10*3/uL (ref 4.0–10.5)
nRBC: 0 % (ref 0.0–0.2)

## 2023-06-23 LAB — IRON AND IRON BINDING CAPACITY (CC-WL,HP ONLY)
Iron: 19 ug/dL — ABNORMAL LOW (ref 28–170)
Saturation Ratios: 4 % — ABNORMAL LOW (ref 10.4–31.8)
TIBC: 507 ug/dL — ABNORMAL HIGH (ref 250–450)
UIBC: 488 ug/dL — ABNORMAL HIGH (ref 148–442)

## 2023-06-23 LAB — FERRITIN: Ferritin: 7 ng/mL — ABNORMAL LOW (ref 11–307)

## 2023-06-23 NOTE — Progress Notes (Signed)
 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 former smoker with h/o HTN, granulomatosis with polyangiitis, endometrial intraepithelial neoplasia scheduled for above procedure 06/26/2023 with Dr. Wiley Hanger.   Per rheumatology clearance pt to hold Imuran  one week prior to surgery.  Pt to receive Solu-cortef 50 mg IV before the procedure and 25 mg IV q8hrs for 24 hours.   Pt seen by hematology 06/23/2023. Pt with iron deficiency anemia, scheduled for IV iron prior to surgery.  VS: BP (!) 162/83   Pulse 83   Temp 36.9 C (Oral)   Resp 16   Ht 5\' 1"  (1.549 m)   Wt 81.2 kg   SpO2 100%   BMI 33.82 kg/m   PROVIDERS: Virl Grimes, PA-C is PCP    LABS: Labs reviewed: Acceptable for surgery. (all labs ordered are listed, but only abnormal results are displayed)  Labs Reviewed  CBC - Abnormal; Notable for the following components:      Result Value   Hemoglobin 8.7 (*)    HCT 29.6 (*)    MCV 70.6 (*)    MCH 20.8 (*)    MCHC 29.4 (*)    RDW 17.5 (*)    Platelets 471 (*)    All other components within normal limits  COMPREHENSIVE METABOLIC PANEL WITH GFR  TYPE AND SCREEN     IMAGES:   EKG:   CV:  Past Medical History:  Diagnosis Date   Chronic abdominal pain    Chronic foot pain    Cyst    Hearing loss    Hypertension    Ischemic bowel disease (HCC)    Joint pain    Mononeuritis multiplex    Nasal bleeding    Rash    Unspecified hereditary and idiopathic peripheral neuropathy    Wegener's granulomatosis     Wegener's syndrome     Past Surgical History:  Procedure Laterality Date   TUBAL LIGATION  1992    MEDICATIONS:  amLODipine (NORVASC) 10 MG tablet   azaTHIOprine  (IMURAN ) 50 MG tablet   fentaNYL  (DURAGESIC ) 25 MCG/HR   olmesartan (BENICAR) 20 MG tablet   senna-docusate (SENOKOT-S) 8.6-50 MG tablet   tapentadol HCl (NUCYNTA) 75 MG tablet   No current facility-administered medications for this encounter.    Chick Cotton Ward, PA-C [facility]-Surgical Testing [phone]

## 2023-06-23 NOTE — Progress Notes (Signed)
 [facility] CONSULT NOTE  Patient Care Team: Hermenia Loose as PCP - General (Physician Assistant)  ASSESSMENT & PLAN:  Tila is a 58 y.o. female with history of HTN, hypothyroidism, being seen for anemia.   Records showed microcytic anemia. Clinically most likely of iron deficiency anemia. Recommend obtain labs today.  Report of postmenopausal bleeding. She is scheduled for robotic assisted total laparoscopic hysterectomy, bilateral salpingo-oophorectomy, sentinel lymph node biopsy, possible lymph node dissection, possible laparotomy this week.   Relevant history: History of postmenopausal bleeding pending hysterectomy Last colonoscopy: 2024 per patient Last EGD: none  The mechanism of IDA is due to either blood loss or decreased absorptive mechanism or both. We discussed some of the risks, benefits, and alternatives of intravenous iron infusions. The patient is symptomatic from anemia and the iron level is critically low. She tolerated oral iron supplement poorly and desires to achieved higher levels of iron faster for adequate hematopoesis. Some of the side-effects to be expected including risks of infusion reactions, phlebitis, headaches, nausea and fatigue.  The patient is willing to proceed.  Ordering iv iron to be given at [address]  Assessment & Plan Iron deficiency anemia due to chronic blood loss IV iron feraheme x 2 ordered. CBC, Iron, TIBC, ferritin today and repeat in 3 -4 months 1-2 days before next visit  Orders Placed This Encounter  Procedures   CBC with Differential (Cancer Center Only)    Standing Status:   Future    Number of Occurrences:   1    Expiration Date:   06/22/2024   Ferritin    Standing Status:   Future    Number of Occurrences:   1    Expiration Date:   06/22/2024   Iron and Iron Binding Capacity ([facility] only)    Standing Status:   Future    Number of Occurrences:   1    Expiration Date:   06/22/2024   All questions  were answered. The patient knows to call the clinic with any problems, questions or concerns.  Lowanda Ruddy, MD 4/28/20251:58 PM   CHIEF COMPLAINTS/PURPOSE OF CONSULTATION:  Anemia  HISTORY OF PRESENTING ILLNESS:  ALOIS DRATH 58 y.o. female is here because of anemia.  Report her menstrual cycle was skipping but back starting September she has daily bleeding. She is schedule hysterectomy.  Hawa had not noticed any recent bleeding such as melena, hematuria or hematochezia.  Her last colonoscopy was last year. No polyps. No stool changes since then.  No ice craving.  She denies blood donation or received blood transfusion   MEDICAL HISTORY:  Past Medical History:  Diagnosis Date   Chronic abdominal pain    Chronic foot pain    Cyst    Hearing loss    Hypertension    Ischemic bowel disease (HCC)    Joint pain    Mononeuritis multiplex    Nasal bleeding    Rash    Unspecified hereditary and idiopathic peripheral neuropathy    Wegener's granulomatosis    Wegener's syndrome     SURGICAL HISTORY: Past Surgical History:  Procedure Laterality Date   TUBAL LIGATION  1992    SOCIAL HISTORY: Social History   Socioeconomic History   Marital status: Single    Spouse name: Not on file   Number of children: Not on file   Years of education: Not on file   Highest education level: Not on file  Occupational History   Not  on file  Tobacco Use   Smoking status: Former    Current packs/day: 0.00    Types: Cigarettes    Quit date: 05/06/2007    Years since quitting: 16.1   Smokeless tobacco: Never  Vaping Use   Vaping status: Never Used  Substance and Sexual Activity   Alcohol use: No   Drug use: No   Sexual activity: Not Currently    Birth control/protection: Post-menopausal    Comment: intercourse age 75, more than 5 sexual partners,des neg  Other Topics Concern   Not on file  Social History Narrative   Not on file   Social Drivers of Health    Financial Resource Strain: Low Risk  (02/27/2022)   Received from [facility]   Overall Financial Resource Strain (CARDIA)    Difficulty of Paying Living Expenses: Not hard at all  Food Insecurity: Low Risk  (11/12/2022)   Received from [facility]   Hunger Vital Sign    Worried About Running Out of Food in the Last Year: Never true    Ran Out of Food in the Last Year: Never true  Transportation Needs: No Transportation Needs (11/12/2022)   Received from [facility]    In the past 12 months, has lack of reliable transportation kept you from medical appointments, meetings, work or from getting things needed for daily living? : No  Physical Activity: Sufficiently Active (02/27/2022)   Received from [facility]   Exercise Vital Sign    Days of Exercise per Week: 4 days    Minutes of Exercise per Session: 60 min  Stress: No Stress Concern Present (02/27/2022)   Received from [facility] - Occupational Stress Questionnaire    Feeling of Stress : Not at all  Social Connections: Socially Integrated (02/27/2022)   Received from [facility]   Social Network    How would you rate your social network (family, work, friends)?: Good participation with social networks  Intimate Partner Violence: Not At Risk (02/27/2022)   Received from [facility]   HITS    Over the last 12 months how often did your partner physically hurt you?: Never    Over the last 12 months how often did your partner insult you or talk down to you?: Never    Over the last 12 months how often did your partner threaten you with physical harm?: Never    Over the last 12 months how often did your partner scream or curse at you?: Never    FAMILY HISTORY: Family History  Problem Relation Age of Onset   Diabetes Mother    Hypertension Mother    Breast cancer Mother    Diabetes Sister    Hypertension Sister    Lung cancer Paternal Aunt    Colon cancer Neg Hx     Prostate cancer Neg Hx    Ovarian cancer Neg Hx    Endometrial cancer Neg Hx    Pancreatic cancer Neg Hx     ALLERGIES:  has no known allergies.  MEDICATIONS:  Current Outpatient Medications  Medication Sig Dispense Refill   amLODipine (NORVASC) 10 MG tablet Take 10 mg by mouth daily.     azaTHIOprine  (IMURAN ) 50 MG tablet Take 2 tablets (100 mg total) by mouth 2 (two) times daily. (Patient taking differently: Take 50 mg by mouth 2 (two) times daily.) 120 tablet 0   fentaNYL  (DURAGESIC ) 25 MCG/HR Place onto the skin.  olmesartan (BENICAR) 20 MG tablet Take 20 mg by mouth daily.     senna-docusate (SENOKOT-S) 8.6-50 MG tablet Take 2 tablets by mouth at bedtime. For AFTER surgery, do not take if having diarrhea 30 tablet 1   tapentadol HCl (NUCYNTA) 75 MG tablet Take 75 mg by mouth 3 (three) times daily as needed for moderate pain (pain score 4-6) (pain).     No current facility-administered medications for this visit.    REVIEW OF SYSTEMS:   All relevant systems were reviewed with the patient and are negative.  PHYSICAL EXAMINATION:  Vitals:   06/23/23 1201  BP: (!) 141/75  Pulse: 80  Resp: 17  Temp: 97.9 F (36.6 C)  SpO2: 100%   Filed Weights   06/23/23 1201  Weight: 185 lb 4.8 oz (84.1 kg)    GENERAL: alert, no distress and comfortable SKIN: skin color normal EYES: pale conjunctiva, sclera clear LUNGS: normal breathing effort

## 2023-06-23 NOTE — Assessment & Plan Note (Signed)
 IV iron feraheme x 2 ordered. CBC, Iron, TIBC, ferritin today and repeat in 3 -4 months 1-2 days before next visit

## 2023-06-25 ENCOUNTER — Telehealth: Payer: Self-pay | Admitting: *Deleted

## 2023-06-25 NOTE — Telephone Encounter (Signed)
2nd attempt to reach patient for pre-op call. Left voicemail requesting call back.

## 2023-06-25 NOTE — Anesthesia Preprocedure Evaluation (Addendum)
 Anesthesia Evaluation  Patient identified by MRN, date of birth, ID band Patient awake    Reviewed: Allergy & Precautions, NPO status , Patient's Chart, lab work & pertinent test results  History of Anesthesia Complications Negative for: history of anesthetic complications  Airway Mallampati: II  TM Distance: >3 FB Neck ROM: Full    Dental  (+) Missing, Dental Advisory Given   Pulmonary former smoker   breath sounds clear to auscultation       Cardiovascular hypertension, Pt. on medications (-) angina  Rhythm:Regular Rate:Normal     Neuro/Psych   Anxiety     Chronic pain: fentanyl  patch    GI/Hepatic negative GI ROS, Neg liver ROS,,,  Endo/Other  Hypothyroidism    Renal/GU negative Renal ROS     Musculoskeletal   Abdominal   Peds  Hematology  (+) Blood dyscrasia (Hb 8.2, plt 444k), anemia   Anesthesia Other Findings Wegener's granulomatosis: Imuran  and prednisone  Reproductive/Obstetrics                              Anesthesia Physical Anesthesia Plan  ASA: 3  Anesthesia Plan: General   Post-op Pain Management: Tylenol  PO (pre-op)*   Induction: Intravenous  PONV Risk Score and Plan: 3 and Dexamethasone , Treatment may vary due to age or medical condition and Ondansetron   Airway Management Planned: Oral ETT  Additional Equipment: Arterial line  Intra-op Plan:   Post-operative Plan: Extubation in OR  Informed Consent: I have reviewed the patients History and Physical, chart, labs and discussed the procedure including the risks, benefits and alternatives for the proposed anesthesia with the patient or authorized representative who has indicated his/her understanding and acceptance.     Dental advisory given  Plan Discussed with: CRNA and Surgeon  Anesthesia Plan Comments: (See PAT note 06/20/2023, "Per rheumatology clearance pt to hold Imuran  one week prior to surgery.  Pt  to receive Solu-cortef  50 mg IV before the procedure and 25 mg IV q8hrs for 24 hours.")        Anesthesia Quick Evaluation

## 2023-06-25 NOTE — Telephone Encounter (Signed)
 Attempted to reach patient for pre-op call. Left voicemail requesting call back.

## 2023-06-25 NOTE — Telephone Encounter (Signed)
 Spoke with Laura [name] and pt was already aware that she needs to stay overnight after her surgery tomorrow due to recommendations from her medical provider for IV steroids for 24 hours. Pt thanked the office for calling.

## 2023-06-25 NOTE — Telephone Encounter (Signed)
-----   Message from Suellyn Emory sent at 06/25/2023  4:45 PM EDT ----- Please let the patient know she will prob need to stay overnight due to the recommendation from her medical provider for IV steroids after surgery x 24 hours.

## 2023-06-25 NOTE — Telephone Encounter (Signed)
 Telephone call to check on pre-operative status.  Patient compliant with pre-operative instructions.  Reinforced nothing to eat after midnight. Clear liquids until 0415. Patient to arrive at 0815.  No questions or concerns voiced.  Instructed to call for any needs.

## 2023-06-26 ENCOUNTER — Ambulatory Visit (HOSPITAL_COMMUNITY)
Admission: RE | Admit: 2023-06-26 | Discharge: 2023-06-27 | Disposition: A | Discharge status: Home / Self Care | Attending: Gynecologic Oncology | Admitting: Gynecologic Oncology

## 2023-06-26 ENCOUNTER — Ambulatory Visit (HOSPITAL_COMMUNITY): Payer: Self-pay | Admitting: Physician Assistant

## 2023-06-26 ENCOUNTER — Ambulatory Visit (HOSPITAL_COMMUNITY): Payer: Self-pay | Admitting: Certified Registered Nurse Anesthetist

## 2023-06-26 ENCOUNTER — Encounter (HOSPITAL_COMMUNITY)
Admission: RE | Disposition: A | Discharge status: Home / Self Care | Payer: Self-pay | Source: Home / Self Care | Attending: Gynecologic Oncology

## 2023-06-26 ENCOUNTER — Encounter (HOSPITAL_COMMUNITY): Payer: Self-pay | Admitting: Gynecologic Oncology

## 2023-06-26 ENCOUNTER — Other Ambulatory Visit: Payer: Self-pay

## 2023-06-26 DIAGNOSIS — N8502 Endometrial intraepithelial neoplasia [EIN]: Secondary | ICD-10-CM

## 2023-06-26 DIAGNOSIS — M3130 Wegener's granulomatosis without renal involvement: Secondary | ICD-10-CM | POA: Diagnosis not present

## 2023-06-26 DIAGNOSIS — I10 Essential (primary) hypertension: Secondary | ICD-10-CM | POA: Diagnosis not present

## 2023-06-26 DIAGNOSIS — D259 Leiomyoma of uterus, unspecified: Secondary | ICD-10-CM | POA: Insufficient documentation

## 2023-06-26 DIAGNOSIS — Z79899 Other long term (current) drug therapy: Secondary | ICD-10-CM | POA: Diagnosis not present

## 2023-06-26 DIAGNOSIS — Z87891 Personal history of nicotine dependence: Secondary | ICD-10-CM | POA: Diagnosis not present

## 2023-06-26 DIAGNOSIS — D649 Anemia, unspecified: Secondary | ICD-10-CM | POA: Diagnosis not present

## 2023-06-26 DIAGNOSIS — G8929 Other chronic pain: Secondary | ICD-10-CM | POA: Insufficient documentation

## 2023-06-26 DIAGNOSIS — Z01818 Encounter for other preprocedural examination: Secondary | ICD-10-CM

## 2023-06-26 HISTORY — PX: INJECTION, FOR SENTINEL LYMPH NODE IDENTIFICATION: SHX7598

## 2023-06-26 HISTORY — PX: ROBOTIC ASSISTED TOTAL HYSTERECTOMY WITH BILATERAL SALPINGO OOPHERECTOMY: SHX6086

## 2023-06-26 HISTORY — PX: LYMPH NODE BIOPSY: SHX201

## 2023-06-26 LAB — TYPE AND SCREEN
ABO/RH(D): O POS
Antibody Screen: NEGATIVE

## 2023-06-26 LAB — ABO/RH: ABO/RH(D): O POS

## 2023-06-26 SURGERY — HYSTERECTOMY, TOTAL, ROBOT-ASSISTED, LAPAROSCOPIC, WITH BILATERAL SALPINGO-OOPHORECTOMY
Anesthesia: General | Site: Pelvis

## 2023-06-26 MED ORDER — FENTANYL 25 MCG/HR TD PT72
1.0000 | MEDICATED_PATCH | TRANSDERMAL | Status: DC
  Administered 2023-06-26: 1 via TRANSDERMAL
  Filled 2023-06-26: qty 1

## 2023-06-26 MED ORDER — HYDROMORPHONE HCL 1 MG/ML IJ SOLN
0.2500 mg | INTRAMUSCULAR | Status: DC | PRN
  Administered 2023-06-26 (×2): 0.5 mg via INTRAVENOUS

## 2023-06-26 MED ORDER — MIDAZOLAM HCL 2 MG/2ML IJ SOLN
INTRAMUSCULAR | Status: AC
  Filled 2023-06-26: qty 2

## 2023-06-26 MED ORDER — ACETAMINOPHEN 500 MG PO TABS: 1000.0000 mg | ORAL_TABLET | Freq: Once | ORAL | Status: AC

## 2023-06-26 MED ORDER — FENTANYL CITRATE (PF) 250 MCG/5ML IJ SOLN
INTRAMUSCULAR | Status: DC | PRN
  Administered 2023-06-26: 50 ug via INTRAVENOUS
  Administered 2023-06-26: 200 ug via INTRAVENOUS

## 2023-06-26 MED ORDER — MEPERIDINE HCL 50 MG/ML IJ SOLN: 6.2500 mg | INTRAMUSCULAR | Status: DC | PRN

## 2023-06-26 MED ORDER — STERILE WATER FOR INJECTION IJ SOLN
INTRAMUSCULAR | Status: AC
  Filled 2023-06-26: qty 10

## 2023-06-26 MED ORDER — HEPARIN SODIUM (PORCINE) 5000 UNIT/ML IJ SOLN
5000.0000 [IU] | INTRAMUSCULAR | Status: AC
  Administered 2023-06-26: 5000 [IU] via SUBCUTANEOUS
  Filled 2023-06-26: qty 1

## 2023-06-26 MED ORDER — MIDAZOLAM HCL 2 MG/2ML IJ SOLN: 0.5000 mg | Freq: Once | INTRAMUSCULAR | Status: DC | PRN

## 2023-06-26 MED ORDER — ENOXAPARIN SODIUM 40 MG/0.4ML IJ SOSY
40.0000 mg | PREFILLED_SYRINGE | INTRAMUSCULAR | Status: DC
  Administered 2023-06-27: 40 mg via SUBCUTANEOUS
  Filled 2023-06-26: qty 0.4

## 2023-06-26 MED ORDER — LIDOCAINE 2% (20 MG/ML) 5 ML SYRINGE
INTRAMUSCULAR | Status: DC | PRN
Start: 2023-06-26 — End: 2023-06-26
  Administered 2023-06-26: 40 mg via INTRAVENOUS

## 2023-06-26 MED ORDER — ONDANSETRON HCL 4 MG/2ML IJ SOLN
INTRAMUSCULAR | Status: DC | PRN
  Administered 2023-06-26: 4 mg via INTRAVENOUS

## 2023-06-26 MED ORDER — BUPIVACAINE HCL (PF) 0.25 % IJ SOLN
INTRAMUSCULAR | Status: AC
  Filled 2023-06-26: qty 30

## 2023-06-26 MED ORDER — PROPOFOL 500 MG/50ML IV EMUL
INTRAVENOUS | Status: DC | PRN
  Administered 2023-06-26: 25 ug/kg/min via INTRAVENOUS

## 2023-06-26 MED ORDER — SENNOSIDES-DOCUSATE SODIUM 8.6-50 MG PO TABS
2.0000 | ORAL_TABLET | Freq: Every day | ORAL | Status: DC
Start: 2023-06-26 — End: 2023-06-27
  Administered 2023-06-26: 2 via ORAL
  Filled 2023-06-26: qty 2

## 2023-06-26 MED ORDER — CEFAZOLIN SODIUM-DEXTROSE 2-4 GM/100ML-% IV SOLN
2.0000 g | INTRAVENOUS | Status: AC
  Administered 2023-06-26: 2 g via INTRAVENOUS
  Filled 2023-06-26: qty 100

## 2023-06-26 MED ORDER — EPHEDRINE SULFATE (PRESSORS) 50 MG/ML IJ SOLN
INTRAMUSCULAR | Status: DC | PRN
Start: 2023-06-26 — End: 2023-06-26
  Administered 2023-06-26: 5 mg via INTRAVENOUS

## 2023-06-26 MED ORDER — HYDROMORPHONE HCL 1 MG/ML IJ SOLN
INTRAMUSCULAR | Status: AC
Start: 2023-06-26 — End: 2023-06-26
  Filled 2023-06-26: qty 1

## 2023-06-26 MED ORDER — METRONIDAZOLE 500 MG/100ML IV SOLN
500.0000 mg | INTRAVENOUS | Status: AC
  Administered 2023-06-26: 500 mg via INTRAVENOUS
  Filled 2023-06-26: qty 100

## 2023-06-26 MED ORDER — STERILE WATER FOR INJECTION IJ SOLN
INTRAMUSCULAR | Status: DC | PRN
  Administered 2023-06-26: 4 mL via INTRAMUSCULAR

## 2023-06-26 MED ORDER — LACTATED RINGERS IV SOLN
INTRAVENOUS | Status: DC
Start: 2023-06-26 — End: 2023-06-26

## 2023-06-26 MED ORDER — CHLORHEXIDINE GLUCONATE 0.12 % MT SOLN
15.0000 mL | Freq: Once | OROMUCOSAL | Status: AC
Start: 2023-06-26 — End: 2023-06-26
  Administered 2023-06-26: 15 mL via OROMUCOSAL

## 2023-06-26 MED ORDER — ORAL CARE MOUTH RINSE: 15.0000 mL | Freq: Once | OROMUCOSAL | Status: AC

## 2023-06-26 MED ORDER — ONDANSETRON HCL 4 MG/2ML IJ SOLN: 4.0000 mg | Freq: Four times a day (QID) | INTRAMUSCULAR | Status: DC | PRN

## 2023-06-26 MED ORDER — OXYCODONE HCL 5 MG PO TABS: 5.0000 mg | ORAL_TABLET | Freq: Once | ORAL | Status: DC | PRN

## 2023-06-26 MED ORDER — ONDANSETRON HCL 4 MG PO TABS: 4.0000 mg | ORAL_TABLET | Freq: Four times a day (QID) | ORAL | Status: DC | PRN

## 2023-06-26 MED ORDER — HYDROCORTISONE SOD SUC (PF) 100 MG IJ SOLR
25.0000 mg | Freq: Three times a day (TID) | INTRAMUSCULAR | Status: AC
  Administered 2023-06-26 – 2023-06-27 (×3): 25 mg via INTRAVENOUS
  Filled 2023-06-26 (×3): qty 2

## 2023-06-26 MED ORDER — BUPIVACAINE HCL 0.25 % IJ SOLN
INTRAMUSCULAR | Status: DC | PRN
  Administered 2023-06-26: 30 mL

## 2023-06-26 MED ORDER — AMLODIPINE BESYLATE 10 MG PO TABS
10.0000 mg | ORAL_TABLET | Freq: Every day | ORAL | Status: DC
  Administered 2023-06-27: 10 mg via ORAL
  Filled 2023-06-26: qty 1

## 2023-06-26 MED ORDER — MIDAZOLAM HCL 2 MG/2ML IJ SOLN
INTRAMUSCULAR | Status: DC | PRN
  Administered 2023-06-26: 2 mg via INTRAVENOUS

## 2023-06-26 MED ORDER — HYDROMORPHONE HCL 1 MG/ML IJ SOLN
0.2500 mg | INTRAMUSCULAR | Status: DC | PRN
  Administered 2023-06-26 (×4): 0.5 mg via INTRAVENOUS

## 2023-06-26 MED ORDER — HYDROMORPHONE HCL 2 MG/ML IJ SOLN
INTRAMUSCULAR | Status: AC
Start: 2023-06-26 — End: ?
  Filled 2023-06-26: qty 1

## 2023-06-26 MED ORDER — PHENYLEPHRINE HCL-NACL 20-0.9 MG/250ML-% IV SOLN
INTRAVENOUS | Status: DC | PRN
Start: 2023-06-26 — End: 2023-06-26
  Administered 2023-06-26: 25 ug/min via INTRAVENOUS

## 2023-06-26 MED ORDER — SUGAMMADEX SODIUM 200 MG/2ML IV SOLN
INTRAVENOUS | Status: DC | PRN
  Administered 2023-06-26: 200 mg via INTRAVENOUS

## 2023-06-26 MED ORDER — HYDROMORPHONE HCL 1 MG/ML IJ SOLN
INTRAMUSCULAR | Status: DC | PRN
  Administered 2023-06-26 (×3): .5 mg via INTRAVENOUS

## 2023-06-26 MED ORDER — TAPENTADOL HCL 50 MG PO TABS
75.0000 mg | ORAL_TABLET | Freq: Three times a day (TID) | ORAL | Status: DC | PRN
  Administered 2023-06-26 – 2023-06-27 (×3): 75 mg via ORAL
  Filled 2023-06-26 (×3): qty 2

## 2023-06-26 MED ORDER — FENTANYL CITRATE (PF) 250 MCG/5ML IJ SOLN
INTRAMUSCULAR | Status: AC
Start: 2023-06-26 — End: ?
  Filled 2023-06-26: qty 5

## 2023-06-26 MED ORDER — HYDROMORPHONE HCL 1 MG/ML IJ SOLN: 0.2500 mg | INTRAMUSCULAR | Status: DC | PRN

## 2023-06-26 MED ORDER — PROPOFOL 10 MG/ML IV BOLUS
INTRAVENOUS | Status: AC
  Filled 2023-06-26: qty 20

## 2023-06-26 MED ORDER — ROCURONIUM BROMIDE 10 MG/ML (PF) SYRINGE
PREFILLED_SYRINGE | INTRAVENOUS | Status: DC | PRN
  Administered 2023-06-26: 70 mg via INTRAVENOUS

## 2023-06-26 MED ORDER — HYDROCORTISONE SOD SUC (PF) 100 MG IJ SOLR
50.0000 mg | INTRAMUSCULAR | Status: AC
  Administered 2023-06-26: 50 mg via INTRAVENOUS
  Filled 2023-06-26: qty 1

## 2023-06-26 MED ORDER — HYDROMORPHONE HCL 1 MG/ML IJ SOLN
INTRAMUSCULAR | Status: AC
  Filled 2023-06-26: qty 1

## 2023-06-26 MED ORDER — STERILE WATER FOR INJECTION IJ SOLN
INTRAMUSCULAR | Status: DC | PRN
  Administered 2023-06-26: 5 mL via INTRAMUSCULAR

## 2023-06-26 MED ORDER — PROPOFOL 10 MG/ML IV BOLUS
INTRAVENOUS | Status: DC | PRN
Start: 2023-06-26 — End: 2023-06-26
  Administered 2023-06-26: 150 mg via INTRAVENOUS

## 2023-06-26 MED ORDER — STERILE WATER FOR IRRIGATION IR SOLN
Status: DC | PRN
  Administered 2023-06-26: 1000 mL

## 2023-06-26 MED ORDER — ACETAMINOPHEN 500 MG PO TABS
1000.0000 mg | ORAL_TABLET | ORAL | Status: AC
  Administered 2023-06-26: 1000 mg via ORAL
  Filled 2023-06-26: qty 2

## 2023-06-26 MED ORDER — ACETAMINOPHEN 500 MG PO TABS
1000.0000 mg | ORAL_TABLET | Freq: Four times a day (QID) | ORAL | Status: DC
  Administered 2023-06-26 – 2023-06-27 (×4): 1000 mg via ORAL
  Filled 2023-06-26 (×4): qty 2

## 2023-06-26 MED ORDER — DEXAMETHASONE SODIUM PHOSPHATE 10 MG/ML IJ SOLN
INTRAMUSCULAR | Status: DC | PRN
  Administered 2023-06-26: 10 mg via INTRAVENOUS

## 2023-06-26 MED ORDER — OXYCODONE HCL 5 MG/5ML PO SOLN: 5.0000 mg | Freq: Once | ORAL | Status: DC | PRN

## 2023-06-26 MED ORDER — ALBUMIN HUMAN 5 % IV SOLN
INTRAVENOUS | Status: DC | PRN
Start: 2023-06-26 — End: 2023-06-26

## 2023-06-26 SURGICAL SUPPLY — 65 items
APPLICATOR SURGIFLO ENDO (HEMOSTASIS) IMPLANT
BAG COUNTER SPONGE SURGICOUNT (BAG) IMPLANT
BAG LAPAROSCOPIC 12 15 PORT 16 (BASKET) IMPLANT
BLADE SURG SZ10 CARB STEEL (BLADE) IMPLANT
COVER BACK TABLE 60X90IN (DRAPES) ×1 IMPLANT
COVER TIP SHEARS 8 DVNC (MISCELLANEOUS) ×1 IMPLANT
DERMABOND ADVANCED .7 DNX12 (GAUZE/BANDAGES/DRESSINGS) ×1 IMPLANT
DRAPE ARM DVNC X/XI (DISPOSABLE) ×4 IMPLANT
DRAPE COLUMN DVNC XI (DISPOSABLE) ×1 IMPLANT
DRAPE SHEET LG 3/4 BI-LAMINATE (DRAPES) ×1 IMPLANT
DRAPE SURG IRRIG POUCH 19X23 (DRAPES) ×1 IMPLANT
DRIVER NDL MEGA SUTCUT DVNCXI (INSTRUMENTS) ×1 IMPLANT
DRIVER NDLE MEGA SUTCUT DVNCXI (INSTRUMENTS) ×1 IMPLANT
DRSG OPSITE POSTOP 4X6 (GAUZE/BANDAGES/DRESSINGS) IMPLANT
DRSG OPSITE POSTOP 4X8 (GAUZE/BANDAGES/DRESSINGS) IMPLANT
ELECT PENCIL ROCKER SW 15FT (MISCELLANEOUS) IMPLANT
ELECT REM PT RETURN 15FT ADLT (MISCELLANEOUS) ×1 IMPLANT
FORCEPS BPLR FENES DVNC XI (FORCEP) ×1 IMPLANT
FORCEPS PROGRASP DVNC XI (FORCEP) ×1 IMPLANT
GAUZE 4X4 16PLY ~~LOC~~+RFID DBL (SPONGE) ×2 IMPLANT
GLOVE BIO SURGEON STRL SZ 6 (GLOVE) ×4 IMPLANT
GLOVE BIO SURGEON STRL SZ 6.5 (GLOVE) ×1 IMPLANT
GOWN STRL REUS W/ TWL LRG LVL3 (GOWN DISPOSABLE) ×4 IMPLANT
GRASPER SUT TROCAR 14GX15 (MISCELLANEOUS) IMPLANT
HOLDER FOLEY CATH W/STRAP (MISCELLANEOUS) IMPLANT
IRRIGATION SUCT STRKRFLW 2 WTP (MISCELLANEOUS) ×1 IMPLANT
KIT PROCEDURE DVNC SI (MISCELLANEOUS) IMPLANT
KIT TURNOVER KIT A (KITS) IMPLANT
LIGASURE IMPACT 36 18CM CVD LR (INSTRUMENTS) IMPLANT
MANIPULATOR ADVINCU DEL 3.0 PL (MISCELLANEOUS) IMPLANT
MANIPULATOR ADVINCU DEL 3.5 PL (MISCELLANEOUS) IMPLANT
MANIPULATOR UTERINE 4.5 ZUMI (MISCELLANEOUS) IMPLANT
NDL HYPO 21X1.5 SAFETY (NEEDLE) ×1 IMPLANT
NDL SPNL 18GX3.5 QUINCKE PK (NEEDLE) IMPLANT
NEEDLE HYPO 21X1.5 SAFETY (NEEDLE) ×1 IMPLANT
NEEDLE SPNL 18GX3.5 QUINCKE PK (NEEDLE) IMPLANT
OBTURATOR OPTICALSTD 8 DVNC (TROCAR) ×1 IMPLANT
PACK ROBOT GYN CUSTOM WL (TRAY / TRAY PROCEDURE) ×1 IMPLANT
PAD POSITIONING PINK XL (MISCELLANEOUS) ×1 IMPLANT
PORT ACCESS TROCAR AIRSEAL 12 (TROCAR) IMPLANT
SCISSORS MNPLR CVD DVNC XI (INSTRUMENTS) ×1 IMPLANT
SCRUB CHG 4% DYNA-HEX 4OZ (MISCELLANEOUS) IMPLANT
SEAL UNIV 5-12 XI (MISCELLANEOUS) ×4 IMPLANT
SET TRI-LUMEN FLTR TB AIRSEAL (TUBING) ×1 IMPLANT
SPIKE FLUID TRANSFER (MISCELLANEOUS) ×1 IMPLANT
SPONGE T-LAP 18X18 ~~LOC~~+RFID (SPONGE) IMPLANT
SURGIFLO W/THROMBIN 8M KIT (HEMOSTASIS) IMPLANT
SUT MNCRL AB 4-0 PS2 18 (SUTURE) IMPLANT
SUT PDS AB 1 TP1 96 (SUTURE) IMPLANT
SUT STRATA PDS 0 30 CT-2.5 (SUTURE) IMPLANT
SUT VIC AB 0 CT1 27XBRD ANTBC (SUTURE) IMPLANT
SUT VIC AB 2-0 CT1 TAPERPNT 27 (SUTURE) IMPLANT
SUT VIC AB 2-0 SH 27X BRD (SUTURE) IMPLANT
SUT VIC AB 4-0 PS2 18 (SUTURE) ×2 IMPLANT
SUT VICRYL 0 27 CT2 27 ABS (SUTURE) ×1 IMPLANT
SUT VLOC 180 0 9IN GS21 (SUTURE) IMPLANT
SYR 10ML LL (SYRINGE) IMPLANT
SYSTEM BAG RETRIEVAL 10MM (BASKET) IMPLANT
SYSTEM WOUND ALEXIS 18CM MED (MISCELLANEOUS) IMPLANT
TRAP SPECIMEN MUCUS 40CC (MISCELLANEOUS) IMPLANT
TRAY FOLEY MTR SLVR 16FR STAT (SET/KITS/TRAYS/PACK) ×1 IMPLANT
TROCAR PORT AIRSEAL 5X120 (TROCAR) IMPLANT
UNDERPAD 30X36 HEAVY ABSORB (UNDERPADS AND DIAPERS) ×2 IMPLANT
WATER STERILE IRR 1000ML POUR (IV SOLUTION) ×1 IMPLANT
YANKAUER SUCT BULB TIP 10FT TU (MISCELLANEOUS) IMPLANT

## 2023-06-26 NOTE — Interval H&P Note (Signed)
 History and Physical Interval Note:  06/26/2023 6:51 AM  Laura [name]  has presented today for surgery, with the diagnosis of ENDOMETRIAL INTRAEPITHELIAL NEOPLASIA.  The various methods of treatment have been discussed with the patient and family. After consideration of risks, benefits and other options for treatment, the patient has consented to  Procedure(s): HYSTERECTOMY, TOTAL, ROBOT-ASSISTED, LAPAROSCOPIC, WITH BILATERAL SALPINGO-OOPHORECTOMY (N/A) INJECTION, FOR SENTINEL LYMPH NODE IDENTIFICATION (N/A) LYMPH NODE BIOPSY (N/A) as a surgical intervention.  The patient's history has been reviewed, patient examined, no change in status, stable for surgery.  I have reviewed the patient's chart and labs.  Questions were answered to the patient's satisfaction.     [name]

## 2023-06-26 NOTE — Anesthesia Postprocedure Evaluation (Signed)
 Anesthesia Post Note  Patient: Laura [name]  Procedure(s) Performed: HYSTERECTOMY, TOTAL, ROBOT-ASSISTED, LAPAROSCOPIC, WITH BILATERAL SALPINGO-OOPHORECTOMY (Pelvis) INJECTION, FOR SENTINEL LYMPH NODE IDENTIFICATION (Pelvis) LYMPH NODE BIOPSY (Pelvis)     Patient location during evaluation: PACU Anesthesia Type: General Level of consciousness: patient cooperative, sedated and oriented Pain management: pain level controlled Vital Signs Assessment: post-procedure vital signs reviewed and stable Respiratory status: spontaneous breathing, nonlabored ventilation and respiratory function stable Cardiovascular status: blood pressure returned to baseline and stable Postop Assessment: no apparent nausea or vomiting Anesthetic complications: no   No notable events documented.  Last Vitals:  Vitals:   06/26/23 1045 06/26/23 1100  BP: 127/69 136/71  Pulse: 92 90  Resp: 14 13  Temp:  (!) 36.3 C  SpO2: 94% 98%    Last Pain:  Vitals:   06/26/23 1100  TempSrc:   PainSc: Asleep                 Stacye Noori,E. Marrion Accomando

## 2023-06-26 NOTE — Anesthesia Procedure Notes (Signed)
 Procedure Name: Intubation Date/Time: 06/26/2023 7:42 AM  Performed by: Virgil Griffiths, CRNAPre-anesthesia Checklist: Patient identified, Emergency Drugs available, Suction available and Patient being monitored Patient Re-evaluated:Patient Re-evaluated prior to induction Oxygen Delivery Method: Circle System Utilized Preoxygenation: Pre-oxygenation with 100% oxygen Induction Type: IV induction Ventilation: Mask ventilation without difficulty Laryngoscope Size: Mac and 3 Grade View: Grade I Tube type: Oral Tube size: 7.0 mm Number of attempts: 1 Airway Equipment and Method: Stylet and Oral airway Placement Confirmation: ETT inserted through vocal cords under direct vision, positive ETCO2 and breath sounds checked- equal and bilateral Secured at: 22 cm Tube secured with: Tape Dental Injury: Teeth and Oropharynx as per pre-operative assessment

## 2023-06-26 NOTE — Plan of Care (Signed)
   Problem: Education: Goal: Knowledge of General Education information will improve Description Including pain rating scale, medication(s)/side effects and non-pharmacologic comfort measures Outcome: Progressing   Problem: Health Behavior/Discharge Planning: Goal: Ability to manage health-related needs will improve Outcome: Progressing

## 2023-06-26 NOTE — Op Note (Signed)
 OPERATIVE NOTE  Pre-operative Diagnosis: EIN  Post-operative Diagnosis: same  Operation: Robotic-assisted laparoscopic total hysterectomy with bilateral salpingoophorectomy, SLN biopsy bilaterally   Surgeon: Wiley Hanger MD  Assistant Surgeon: Vira Grieves, NP (an NP assistant was necessary for tissue manipulation, management of robotic instrumentation, retraction and positioning due to the complexity of the case and hospital policies).   Anesthesia: GET  Urine Output: 300 cc  Operative Findings:  On EUA, 8-10 cm uterus. On intra-abdominal entry, normal upper abdominal survey. Normal omentum, small and large bowel. Uterus 10 and normal in appearance, mildly bulbous. Normal appearing adnexa. No ascites. No adenopathy. Mapping successful to bilateral obturator SLNs, left external iliac SLN.  Estimated Blood Loss:  75 cc      Total IV Fluids: see I&O flowsheet         Specimens: uterus, cervix, bilateral tubes and ovaries, bilateral obturator SLNs, left external iliac SLN         Complications:  None apparent; patient tolerated the procedure well.         Disposition: PACU - hemodynamically stable.  Procedure Details  The patient was seen in the Holding Room. The risks, benefits, complications, treatment options, and expected outcomes were discussed with the patient.  The patient concurred with the proposed plan, giving informed consent.  The site of surgery properly noted/marked. The patient was identified as Laura [name] and the procedure verified as a Robotic-assisted hysterectomy with bilateral salpingo oophorectomy with SLN biopsy.   After induction of anesthesia, the patient was draped and prepped in the usual sterile manner. Patient was placed in supine position after anesthesia and draped and prepped in the usual sterile manner as follows: Her arms were tucked to her side with all appropriate precautions.  The patient was secured to the bed using padding and tape across  her chest.  The patient was placed in the semi-lithotomy position in Tyro stirrups.  The perineum and vagina were prepped with CHG. The patient's abdomen was prepped with ChloraPrep and she was draped after the prep had been allowed to dry for 3 minutes.  A Time Out was held and the above information confirmed.  The urethra was prepped with Betadine. Foley catheter was placed.  A sterile speculum was placed in the vagina.  The cervix was grasped with a single-tooth tenaculum. 2mg  total of ICG was injected into the cervical stroma at 2 and 9 o'clock with 1cc injected at a 1cm and 2mm depth (concentration 0.5mg /ml) in all locations. The cervix was dilated with Adriana Hopping dilators.  The ZUMI uterine manipulator with a medium colpotomizer ring was placed without difficulty.  A pneum occluder balloon was placed over the manipulator.  OG tube placement was confirmed and to suction.   Next, a 10 mm skin incision was made 1 cm below the subcostal margin in the midclavicular line.  The 5 mm Optiview port and scope was used for direct entry.  Opening pressure was under 10 mm CO2.  The abdomen was insufflated and the findings were noted as above.   At this point and all points during the procedure, the patient's intra-abdominal pressure did not exceed 15 mmHg. Next, an 8 mm skin incision was made superior to the umbilicus and a right and left port were placed about 8 cm lateral to the robot port on the right and left side.  A fourth arm was placed on the right.  The 5 mm assist trocar was exchanged for a 12 mm airseal port. All ports were placed  under direct visualization.  The patient was placed in steep Trendelenburg.  Bowel was folded away into the upper abdomen.  The robot was docked in the normal manner.  The right and left peritoneum were opened parallel to the IP ligament to open the retroperitoneal spaces bilaterally. The round ligaments were transected. The SLN mapping was performed in bilateral pelvic basins. After  identifying the ureters, the para rectal and paravesical spaces were opened up entirely with careful dissection below the level of the ureters bilaterally and to the depth of the uterine artery origin in order to skeletonize the uterine "web" and ensure visualization of all parametrial channels. The para-aortic basins were carefully exposed and evaluated for isolated para-aortic SLN's. Lymphatic channels were identified travelling to the following visualized sentinel lymph node's: bilateral obturator and left external iliac SLNs. These SLN's were separated from their surrounding lymphatic tissue, removed and sent for permanent pathology.  The hysterectomy was started.  The ureter was again noted to be on the medial leaf of the broad ligament.  The peritoneum above the ureter was incised and stretched and the infundibulopelvic ligament was skeletonized, cauterized and cut.  The posterior peritoneum was taken down to the level of the KOH ring.  The anterior peritoneum was also taken down.  The bladder flap was created to the level of the KOH ring.  The uterine artery on the right side was skeletonized, cauterized and cut in the normal manner.  A similar procedure was performed on the left.  The colpotomy was made and the uterus, cervix, bilateral ovaries and tubes were amputated and delivered through the vagina.  Pedicles were inspected and excellent hemostasis was achieved.    The colpotomy at the vaginal cuff was closed with 0 Vicryl with a figure of eight at each apex and 0 V-Loc to close the midportion of the cuff in two layers in a running manner.  Irrigation was used and excellent hemostasis was achieved.  At this point in the procedure was completed.  Robotic instruments were removed under direct visulaization.  The robot was undocked. The fascia at the 12 mm airseal port was closed with 0 Vicryl on a UR-5 needle.  The subcuticular tissue was closed with 4-0 Vicryl and the skin was closed with 4-0 Monocryl  in a subcuticular manner.  Dermabond was applied.    The vagina was swabbed with minimal bleeding noted. Foley catheter was removed  All sponge, lap and needle counts were correct x  3.   The patient was transferred to the recovery room in stable condition.  Wiley Hanger, MD

## 2023-06-26 NOTE — Anesthesia Procedure Notes (Signed)
 Arterial Line Insertion Start/End5/02/2023 7:00 AM Performed by: Virgil Griffiths, CRNA, CRNA  Patient sedated Right, radial was placed Catheter size: 20 G  Attempts: 1 Procedure performed without using ultrasound guided technique.

## 2023-06-26 NOTE — Transfer of Care (Signed)
 Immediate Anesthesia Transfer of Care Note  Patient: Laura [name]  Procedure(s) Performed: HYSTERECTOMY, TOTAL, ROBOT-ASSISTED, LAPAROSCOPIC, WITH BILATERAL SALPINGO-OOPHORECTOMY (Pelvis) INJECTION, FOR SENTINEL LYMPH NODE IDENTIFICATION (Pelvis) LYMPH NODE BIOPSY (Pelvis)  Patient Location: PACU  Anesthesia Type:General  Level of Consciousness: awake, alert , and oriented  Airway & Oxygen Therapy: Patient Spontanous Breathing and Patient connected to nasal cannula oxygen  Post-op Assessment: Report given to RN and Post -op Vital signs reviewed and stable  Post vital signs: Reviewed and stable  Last Vitals:  Vitals Value Taken Time  BP 132/80 06/26/23 1000  Temp 36.3 C 06/26/23 0956  Pulse 98 06/26/23 1001  Resp 17 06/26/23 1001  SpO2 95 % 06/26/23 1001  Vitals shown include unfiled device data.  Last Pain:  Vitals:   06/26/23 0603  TempSrc:   PainSc: 8       Patients Stated Pain Goal: 5 (06/26/23 0556)  Complications: No notable events documented.

## 2023-06-27 ENCOUNTER — Telehealth: Payer: Self-pay | Admitting: *Deleted

## 2023-06-27 ENCOUNTER — Encounter (HOSPITAL_COMMUNITY): Payer: Self-pay | Admitting: Gynecologic Oncology

## 2023-06-27 DIAGNOSIS — N8502 Endometrial intraepithelial neoplasia [EIN]: Secondary | ICD-10-CM | POA: Diagnosis not present

## 2023-06-27 LAB — BASIC METABOLIC PANEL WITH GFR
Anion gap: 11 (ref 5–15)
BUN: 17 mg/dL (ref 6–20)
CO2: 20 mmol/L — ABNORMAL LOW (ref 22–32)
Calcium: 9.3 mg/dL (ref 8.9–10.3)
Chloride: 105 mmol/L (ref 98–111)
Creatinine, Ser: 0.77 mg/dL (ref 0.44–1.00)
GFR, Estimated: >60 mL/min (ref >60)
Glucose, Bld: 169 mg/dL — ABNORMAL HIGH (ref 70–99)
Potassium: 3.4 mmol/L — ABNORMAL LOW (ref 3.5–5.1)
Sodium: 136 mmol/L (ref 135–145)

## 2023-06-27 LAB — CBC
HCT: 26.4 % — ABNORMAL LOW (ref 36.0–46.0)
Hemoglobin: 7.9 g/dL — ABNORMAL LOW (ref 12.0–15.0)
MCH: 21 pg — ABNORMAL LOW (ref 26.0–34.0)
MCHC: 29.9 g/dL — ABNORMAL LOW (ref 30.0–36.0)
MCV: 70 fL — ABNORMAL LOW (ref 80.0–100.0)
Platelets: 430 10*3/uL — ABNORMAL HIGH (ref 150–400)
RBC: 3.77 MIL/uL — ABNORMAL LOW (ref 3.87–5.11)
RDW: 17.9 % — ABNORMAL HIGH (ref 11.5–15.5)
WBC: 12.6 10*3/uL — ABNORMAL HIGH (ref 4.0–10.5)
nRBC: 0.2 % (ref 0.0–0.2)

## 2023-06-27 LAB — SURGICAL PATHOLOGY

## 2023-06-27 NOTE — Telephone Encounter (Addendum)
 Spoke with Dr. Ziolkowska from [facility] -patient's Rheumatologist to clarify when the patient can restart back on her imuran  "when the wound is dry"?   Explained to Dr. Ziolkowska patient has laparoscopic sites on her abdomen with dermabond and the dermabond will peel off in 2-3 weeks. Dr. Ziolkowska states "then patient can resume imuran  in two weeks".  Thanked Dr. Lindle Rhea for clarifying recommendations for Ms. Rhody.

## 2023-06-27 NOTE — Discharge Summary (Signed)
 Physician Discharge Summary  Patient ID: Laura [name] MRN: [id number] DOB/AGE: 10-01-1965 58 y.o.  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    Hospital Course: On 06/26/2023, the patient underwent the following: Procedure(s): HYSTERECTOMY, TOTAL, ROBOT-ASSISTED, LAPAROSCOPIC, WITH BILATERAL SALPINGO-OOPHORECTOMY INJECTION, FOR SENTINEL LYMPH NODE IDENTIFICATION LYMPH NODE BIOPSY. The postoperative course was uneventful.  She was monitored overnight and given IV solucortef x 24 hours per recommendations from her rheumatologist with [facility]. She was discharged to home on postoperative day 1 tolerating a regular diet, ambulating, voiding, pain managed on chronic regimen, meeting post-op milestones.   Consults: None  Significant Diagnostic Studies: Labs  Treatments: Surgery: see above, IV solucortef x 24 hrs  Discharge Exam: Blood pressure (!) 141/70, pulse 93, temperature 98.4 F (36.9 C), temperature source Oral, resp. rate 18, height 5\' 1"  (1.549 m), weight 185 lb 4.8 oz (84.1 kg), SpO2 98%. General appearance: alert, cooperative, appears stated age, and no distress Resp: clear to auscultation bilaterally Cardio: regular rate and rhythm, S1, S2 normal, no murmur, click, rub or gallop GI: soft, non-tender; bowel sounds normal; no masses,  no organomegaly Extremities: extremities normal, atraumatic, no cyanosis or edema Incision/Wound: lap sites to the abdomen with dermabond intact with no active drainage  Disposition: Discharge disposition: 01-Home or Self Care       Discharge Instructions     Call MD for:  difficulty breathing, headache or visual disturbances   Complete by: As directed    Call MD for:  extreme fatigue   Complete  by: As directed    Call MD for:  hives   Complete by: As directed    Call MD for:  persistant dizziness or light-headedness   Complete by: As directed    Call MD for:  persistant nausea and vomiting   Complete by: As directed    Call MD for:  redness, tenderness, or signs of infection (pain, swelling, redness, odor or green/yellow discharge around incision site)   Complete by: As directed    Call MD for:  severe uncontrolled pain   Complete by: As directed    Call MD for:  temperature >100.4   Complete by: As directed    Diet - low sodium heart healthy   Complete by: As directed    Driving Restrictions   Complete by: As directed    No driving for at least 1-19 days until the following have been met: Do not take narcotics and drive. You need to make sure your reaction time has returned.   Increase activity slowly   Complete by: As directed    Lifting restrictions   Complete by: As directed    No lifting greater than 10 lbs, pushing, pulling, straining for 6 weeks.   Sexual Activity Restrictions   Complete by: As directed    No sexual activity, nothing in the vagina, for 10-12 weeks.      Allergies as of 06/27/2023   No Known Allergies      Medication List     PAUSE taking these medications    azaTHIOprine  50 MG tablet Wait to take this until: Jul 10, 2023 Hold for two weeks per your rheumatologist Commonly known as: IMURAN  Take 2 tablets (100 mg total) by mouth 2 (two) times daily.       TAKE these  medications    amLODipine  10 MG tablet Commonly known as: NORVASC  Take 10 mg by mouth daily.   fentaNYL  25 MCG/HR Commonly known as: DURAGESIC  Place onto the skin.   olmesartan 20 MG tablet Commonly known as: BENICAR Take 20 mg by mouth daily.   senna-docusate 8.6-50 MG tablet Commonly known as: Senokot-S Take 2 tablets by mouth at bedtime. For AFTER surgery, do not take if having diarrhea   tapentadol  HCl 75 MG tablet Commonly known as: NUCYNTA  Take 75 mg  by mouth 3 (three) times daily as needed for moderate pain (pain score 4-6) (pain).        Follow-up Information     [name], MD Follow up on 07/03/2023.   Specialty: Gynecologic Oncology Why: will be a PHONE visit in the evening to check in and discuss pathology. IN PERSON visit will be on 07/18/23 at 3pm at the [facility]. Contact information: [address] [place] [address] [phone]                 Greater than thirty minutes were spend for face to face discharge instructions and discharge orders/summary in EPIC.   Signed: Suellyn Emory 06/27/2023, 10:01 AM

## 2023-06-27 NOTE — Progress Notes (Signed)
 The patient remains stable. She is alert, oriented and ambulatory without assistance. Abdominal incisions are clean, dry and intact. No edema erythema or exudate present. Discharge instructions were reviewed. She denied questions, or concerns at this time.

## 2023-06-27 NOTE — Telephone Encounter (Signed)
-----   Message from Suellyn Emory sent at 06/26/2023  4:35 PM EDT ----- We received clearance from her rheum MD. Dr. Orvil Bland wants you to reach out and clarify what it means to restart imuran  when the "wound is dry." She was thinking holding it for 7-10 days post-op but that would mean she is going home on nothing after having the IV steroids while in the hospital.   We need clarity of what he wants her to do after discharge and exactly what the time frame would be for the "wound is dry."

## 2023-06-27 NOTE — Discharge Instructions (Signed)
 AFTER SURGERY INSTRUCTIONS   Return to work: 4-6 weeks if applicable  Per your rheumatologist, do not restart imuran  for 2 weeks after surgery.   Activity: 1. Be up and out of the bed during the day.  Take a nap if needed.  You may walk up steps but be careful and use the hand rail.  Stair climbing will tire you more than you think, you may need to stop part way and rest.    2. No lifting or straining for 6 weeks over 10 pounds. No pushing, pulling, straining for 6 weeks.   3. No driving for 1-91 days when the following criteria have been met: Do not drive if you are taking narcotic pain medicine and make sure that your reaction time has returned.    4. You can shower as soon as the next day after surgery. Shower daily.  Use your regular soap and water  (not directly on the incision) and pat your incision(s) dry afterwards; don't rub.  No tub baths or submerging your body in water  until cleared by your surgeon. If you have the soap that was given to you by pre-surgical testing that was used before surgery, you do not need to use it afterwards because this can irritate your incisions.    5. No sexual activity and nothing in the vagina for 10-12 weeks.   6. You may experience a small amount of clear drainage from your incisions, which is normal.  If the drainage persists, increases, or changes color please call the office.   7. Do not use creams, lotions, or ointments such as neosporin on your incisions after surgery until advised by your surgeon because they can cause removal of the dermabond glue on your incisions.     8. You may experience vaginal spotting after surgery or when the stitches at the top of the vagina begin to dissolve.  The spotting is normal but if you experience heavy bleeding, call our office.   9. Continue on your chronic pain regimen.   Diet: 1. Low sodium Heart Healthy Diet is recommended but you are cleared to resume your normal (before surgery) diet after your  procedure.   2. It is safe to use a laxative, such as Miralax or Colace, if you have difficulty moving your bowels before surgery. You have been prescribed Sennakot-S to take at bedtime every evening after surgery to keep bowel movements regular and to prevent constipation.     Wound Care: 1. Keep clean and dry.  Shower daily.   Reasons to call the Doctor: Fever - Oral temperature greater than 100.4 degrees Fahrenheit Foul-smelling vaginal discharge Difficulty urinating Nausea and vomiting Increased pain at the site of the incision that is unrelieved with pain medicine. Difficulty breathing with or without chest pain New calf pain especially if only on one side Sudden, continuing increased vaginal bleeding with or without clots.   Contacts: For questions or concerns you should contact:   Dr. Wiley Hanger at [phone]   Vira Grieves, NP at [phone]   After Hours: call [phone] and have the GYN Oncologist paged/contacted (after 5 pm or on the weekends). You will speak with an after hours RN and let he or she know you have had surgery.   Messages sent via mychart are for non-urgent matters and are not responded to after hours so for urgent needs, please call the after hours number.

## 2023-06-28 LAB — POCT I-STAT 7, (LYTES, BLD GAS, ICA,H+H)
Acid-base deficit: 1 mmol/L (ref 0.0–2.0)
Bicarbonate: 22.9 mmol/L (ref 20.0–28.0)
Calcium, Ion: 1.22 mmol/L (ref 1.15–1.40)
HCT: 24 % — ABNORMAL LOW (ref 36.0–46.0)
Hemoglobin: 8.2 g/dL — ABNORMAL LOW (ref 12.0–15.0)
O2 Saturation: 100 %
Patient temperature: 35.7
Potassium: 3.6 mmol/L (ref 3.5–5.1)
Sodium: 139 mmol/L (ref 135–145)
TCO2: 24 mmol/L (ref 22–32)
pCO2 arterial: 32.3 mmHg (ref 32–48)
pH, Arterial: 7.453 — ABNORMAL HIGH (ref 7.35–7.45)
pO2, Arterial: 233 mmHg — ABNORMAL HIGH (ref 83–108)

## 2023-06-30 ENCOUNTER — Telehealth: Payer: Self-pay | Admitting: *Deleted

## 2023-06-30 NOTE — Telephone Encounter (Signed)
 Spoke with Laura [name] this morning. She states she is eating, drinking and urinating well. She has not had a BM yet but is passing gas. She is taking senokot as prescribed and encouraged her to drink plenty of water . Advised patient to take 1 capful of miralax twice daily and if no BM within 48 hrs. To call the office. Pt states she is not having any gas pains and doesn't feel bloated. She denies fever or chills. Incisions are dry and intact. She rates her pain 4/10. Her pain is controlled with tylenol  and her pain management med (nucynta ).     Instructed to call office with any fever, chills, purulent drainage, uncontrolled pain or any other questions or concerns. Patient verbalizes understanding.   Pt aware of post op appointments as well as the office number [phone] and after hours number [phone] to call if she has any questions or concerns

## 2023-07-01 ENCOUNTER — Telehealth: Payer: Self-pay

## 2023-07-01 NOTE — Telephone Encounter (Signed)
 Dr. Alita Irwin, patient will be scheduled as soon as possible.  Auth Submission: NO AUTH NEEDED Site of care: Site of care: [facility] Payer: [employer] commercial Medication & CPT/J Code(s) submitted: Feraheme (ferumoxytol) [id number] Route of submission (phone, fax, portal): policy document online Phone # Fax # Auth type: Buy/Bill PB Units/visits requested: 510mg  x 2 doses Reference number:  Approval from: 07/01/23 to 12/01/23  [email or web address]

## 2023-07-03 ENCOUNTER — Inpatient Hospital Stay: Attending: Gynecologic Oncology | Admitting: Gynecologic Oncology

## 2023-07-03 ENCOUNTER — Encounter: Payer: Self-pay | Admitting: Gynecologic Oncology

## 2023-07-03 DIAGNOSIS — Z90710 Acquired absence of both cervix and uterus: Secondary | ICD-10-CM

## 2023-07-03 DIAGNOSIS — N8502 Endometrial intraepithelial neoplasia [EIN]: Secondary | ICD-10-CM

## 2023-07-03 DIAGNOSIS — Z9079 Acquired absence of other genital organ(s): Secondary | ICD-10-CM

## 2023-07-03 DIAGNOSIS — Z90722 Acquired absence of ovaries, bilateral: Secondary | ICD-10-CM

## 2023-07-03 NOTE — Progress Notes (Signed)
 Gynecologic Oncology Telehealth Note: Gyn-Onc  I connected with Laura [name] on 07/03/23 at  6:00 PM EDT by telephone and verified that I am speaking with the correct person using two identifiers.  I discussed the limitations, risks, security and privacy concerns of performing an evaluation and management service by telemedicine and the availability of in-person appointments. I also discussed with the patient that there may be a patient responsible charge related to this service. The patient expressed understanding and agreed to proceed.  Other persons participating in the visit and their role in the encounter: none.  Patient's location: home Provider's location: [place]  Reason for Visit: follow-up  Treatment History: Patient developed postmenopausal bleeding.  She was seen in January of this year and reported bleeding that started in December that lasted weeks.  Endorsed several cycles over the last year. Her Prometrium  was increased to 200 mg as part of her HRT regimen.  Bleeding stopped for several days and then restarted. 04/01/23: Pelvic ultrasound exam at the [facility] imaging revealed a uterus with normal size measurements, no myometrial masses.  Endometrium 7-8 mm, avascular.  Bilateral ovaries normal in appearance.  No free fluid. 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  06/26/23: Robotic-assisted laparoscopic total hysterectomy with bilateral salpingoophorectomy, SLN biopsy bilaterally   Interval History: Doing well. Bowels moving well. Denies urinary symptoms. Denies vaginal bleeding.   Past Medical/Surgical History: Past Medical History:  Diagnosis Date   Chronic abdominal pain    Chronic foot pain    Cyst    Hearing loss    Hypertension    Ischemic bowel disease (HCC)    Joint pain    Mononeuritis multiplex    Nasal bleeding    Rash    Unspecified hereditary and idiopathic  peripheral neuropathy    Wegener's granulomatosis    Wegener's syndrome     Past Surgical History:  Procedure Laterality Date   INJECTION, FOR SENTINEL LYMPH NODE IDENTIFICATION N/A 06/26/2023   Procedure: INJECTION, FOR SENTINEL LYMPH NODE IDENTIFICATION;  Surgeon: [name], MD;  Location: [facility];  Service: Gynecology;  Laterality: N/A;   LYMPH NODE BIOPSY N/A 06/26/2023   Procedure: LYMPH NODE BIOPSY;  Surgeon: [name], MD;  Location: [facility];  Service: Gynecology;  Laterality: N/A;   ROBOTIC ASSISTED TOTAL HYSTERECTOMY WITH BILATERAL SALPINGO OOPHERECTOMY N/A 06/26/2023   Procedure: HYSTERECTOMY, TOTAL, ROBOT-ASSISTED, LAPAROSCOPIC, WITH BILATERAL SALPINGO-OOPHORECTOMY;  Surgeon: [name], MD;  Location: [facility];  Service: Gynecology;  Laterality: N/A;   TUBAL LIGATION  1992    Family History  Problem Relation Age of Onset   Diabetes Mother    Hypertension Mother    Breast cancer Mother    Diabetes Sister    Hypertension Sister    Lung cancer Paternal Aunt    Colon cancer Neg Hx    Prostate cancer Neg Hx    Ovarian cancer Neg Hx    Endometrial cancer Neg Hx    Pancreatic cancer Neg Hx     Social History   Socioeconomic History   Marital status: Single    Spouse name: Not on file   Number of children: Not on file   Years of education: Not on file   Highest education level: Not on file  Occupational History   Not on file  Tobacco Use   Smoking status: Former    Current packs/day: 0.00    Types: Cigarettes    Quit date: 05/06/2007  Years since quitting: 16.1    Passive exposure: Never   Smokeless tobacco: Never  Vaping Use   Vaping status: Never Used  Substance and Sexual Activity   Alcohol use: No   Drug use: No   Sexual activity: Not Currently    Birth control/protection: Post-menopausal    Comment: intercourse age 60, more than 5 sexual partners,des neg  Other Topics Concern   Not on file  Social History Narrative   Not on file    Social Drivers of Health   Financial Resource Strain: Low Risk  (02/27/2022)   [facility]   Overall Financial Resource Strain (CARDIA)    Difficulty of Paying Living Expenses: Not hard at all  Food Insecurity: Patient Declined (06/26/2023)   Hunger Vital Sign    Worried About Running Out of Food in the Last Year: Patient declined    Ran Out of Food in the Last Year: Patient declined  Transportation Needs: No Transportation Needs (06/26/2023)   PRAPARE - Administrator, Civil Service (Medical): No    Lack of Transportation (Non-Medical): No  Physical Activity: Sufficiently Active (02/27/2022)   Received from [facility]   Exercise Vital Sign    Days of Exercise per Week: 4 days    Minutes of Exercise per Session: 60 min  Stress: No Stress Concern Present (02/27/2022)   Received from [facility] of Occupational Health - Occupational Stress Questionnaire    Feeling of Stress : Not at all  Social Connections: Socially Integrated (02/27/2022)   Received from [facility]   Social Network    How would you rate your social network (family, work, friends)?: Good participation with social networks    Current Medications:  Current Outpatient Medications:    amLODipine  (NORVASC ) 10 MG tablet, Take 10 mg by mouth daily., Disp: , Rfl:    [Paused] azaTHIOprine  (IMURAN ) 50 MG tablet, Take 2 tablets (100 mg total) by mouth 2 (two) times daily. (Patient taking differently: Take 50 mg by mouth 2 (two) times daily.), Disp: 120 tablet, Rfl: 0   fentaNYL  (DURAGESIC ) 25 MCG/HR, Place onto the skin., Disp: , Rfl:    olmesartan (BENICAR) 20 MG tablet, Take 20 mg by mouth daily., Disp: , Rfl:    senna-docusate (SENOKOT-S) 8.6-50 MG tablet, Take 2 tablets by mouth at bedtime. For AFTER surgery, do not take if having diarrhea, Disp: 30 tablet, Rfl: 1   tapentadol  HCl (NUCYNTA ) 75 MG tablet, Take 75 mg by mouth 3 (three) times daily as needed for moderate pain  (pain score 4-6) (pain)., Disp: , Rfl:   Review of Symptoms: Pertinent positives as per HPI.  Physical Exam: Deferred given limitations of phone visit.  Laboratory & Radiologic Studies: A. SENTINEL LYMPH NODE, RIGHT OBTURATOR, EXCISION:  - Negative for carcinoma (0/1)   B. SENTINEL LYMPH NODE, LEFT EXTERNAL ILIAC, EXCISION:  - Negative for carcinoma (0/1)   C. SENTINEL LYMPH NODE, LEFT OBTURATOR, EXCISION:  - Negative for carcinoma (0/1)   D. UTERUS, CERVIX, BILATERAL FALLOPIAN TUBES AND OVARIES, HYSTERECTOMY:  - Cervix: Benign nabothian cysts  - Endometrium: Benign, no residual endometrial intraepithelial neoplasia  identified  - Myometrium: Leiomyomata  - Bilateral fallopian tubes and ovaries: No significant pathologic  changes   Assessment & Plan: Laura [name] is a 57 y.o. 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  Doing well. Meeting postop milestones. Discussed continued  expectations. Reviewed pathology with her - she is very happy with this news.  I discussed the assessment and treatment plan with the patient. The patient was provided with an opportunity to ask questions and all were answered. The patient agreed with the plan and demonstrated an understanding of the instructions.   The patient was advised to call back or see an in-person evaluation if the symptoms worsen or if the condition fails to improve as anticipated.   6 minutes of total time was spent for this patient encounter, including preparation, phone counseling with the patient and coordination of care, and documentation of the encounter.   Wiley Hanger, MD  Division of Gynecologic Oncology  Department of Obstetrics and Gynecology  [facility]

## 2023-07-04 ENCOUNTER — Ambulatory Visit (INDEPENDENT_AMBULATORY_CARE_PROVIDER_SITE_OTHER)

## 2023-07-04 VITALS — BP 127/75 | HR 87 | Temp 98.2°F | Resp 14 | Ht 61.0 in | Wt 182.2 lb

## 2023-07-04 DIAGNOSIS — D509 Iron deficiency anemia, unspecified: Secondary | ICD-10-CM

## 2023-07-04 DIAGNOSIS — D500 Iron deficiency anemia secondary to blood loss (chronic): Secondary | ICD-10-CM

## 2023-07-04 MED ORDER — SODIUM CHLORIDE 0.9 % IV SOLN
510.0000 mg | Freq: Once | INTRAVENOUS | Status: AC
  Administered 2023-07-04: 510 mg via INTRAVENOUS
  Filled 2023-07-04: qty 17

## 2023-07-04 NOTE — Progress Notes (Signed)
 Diagnosis: Iron Deficiency Anemia  Provider:  Mannam, Praveen MD  Procedure: IV Infusion  IV Type: Peripheral, IV Location: L Antecubital  Feraheme (Ferumoxytol), Dose: 510 mg  Infusion Start Time: 1555  Infusion Stop Time: 1610  Post Infusion IV Care: Observation period completed and Peripheral IV Discontinued  Discharge: Condition: Good, Destination: Home . AVS Provided  Performed by:  Lendel Quant, RN

## 2023-07-11 ENCOUNTER — Ambulatory Visit

## 2023-07-11 VITALS — BP 144/86 | HR 92 | Temp 98.6°F | Resp 16 | Ht 61.0 in | Wt 180.2 lb

## 2023-07-11 DIAGNOSIS — D509 Iron deficiency anemia, unspecified: Secondary | ICD-10-CM

## 2023-07-11 DIAGNOSIS — D500 Iron deficiency anemia secondary to blood loss (chronic): Secondary | ICD-10-CM

## 2023-07-11 MED ORDER — SODIUM CHLORIDE 0.9 % IV SOLN
510.0000 mg | Freq: Once | INTRAVENOUS | Status: AC
  Administered 2023-07-11: 510 mg via INTRAVENOUS
  Filled 2023-07-11: qty 17

## 2023-07-11 NOTE — Progress Notes (Signed)
 Diagnosis: Iron Deficiency Anemia  Provider:  Praveen Mannam MD  Procedure: IV Infusion  IV Type: Peripheral, IV Location: R Forearm  Feraheme (Ferumoxytol), Dose: 510 mg  Infusion Start Time: 1522  Infusion Stop Time: 1538  Post Infusion IV Care: Observation period completed and Peripheral IV Discontinued  Discharge: Condition: Good, Destination: Home . AVS Provided  Performed by:  Jarita Raval, RN

## 2023-07-18 ENCOUNTER — Encounter: Payer: Self-pay | Admitting: Gynecologic Oncology

## 2023-07-18 ENCOUNTER — Inpatient Hospital Stay (HOSPITAL_BASED_OUTPATIENT_CLINIC_OR_DEPARTMENT_OTHER): Admitting: Gynecologic Oncology

## 2023-07-18 VITALS — BP 137/70 | HR 76 | Temp 98.4°F | Resp 17 | Ht 61.0 in | Wt 178.8 lb

## 2023-07-18 DIAGNOSIS — N951 Menopausal and female climacteric states: Secondary | ICD-10-CM

## 2023-07-18 DIAGNOSIS — Z9079 Acquired absence of other genital organ(s): Secondary | ICD-10-CM

## 2023-07-18 DIAGNOSIS — N8502 Endometrial intraepithelial neoplasia [EIN]: Secondary | ICD-10-CM

## 2023-07-18 DIAGNOSIS — Z90722 Acquired absence of ovaries, bilateral: Secondary | ICD-10-CM

## 2023-07-18 DIAGNOSIS — Z90710 Acquired absence of both cervix and uterus: Secondary | ICD-10-CM

## 2023-07-18 NOTE — Patient Instructions (Signed)
 It was good to see you.  You are healing very well from surgery.  Please remember, no heavy lifting for 6 weeks after surgery and nothing in the vagina for at least 12 weeks.  It would be okay for you to restart your estrogen patch.  You no longer need to take the progesterone  pill.  Please follow-up with your OB/GYN.

## 2023-07-18 NOTE — Progress Notes (Signed)
 Gynecologic Oncology Return Clinic Visit  07/18/23  Reason for Visit: follow-up  Treatment History: Patient developed postmenopausal bleeding.  She was seen in January of this year and reported bleeding that started in December that lasted weeks.  Endorsed several cycles over the last year. Her Prometrium  was increased to 200 mg as part of her HRT regimen.  Bleeding stopped for several days and then restarted. 04/01/23: Pelvic ultrasound exam at the [facility] imaging revealed a uterus with normal size measurements, no myometrial masses.  Endometrium 7-8 mm, avascular.  Bilateral ovaries normal in appearance.  No free fluid. 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   06/26/23: Robotic-assisted laparoscopic total hysterectomy with bilateral salpingoophorectomy, SLN biopsy bilaterally   Interval History: Doing well.  Denies any vaginal bleeding.  Denies abdominal or pelvic pain.  Reports normal bowel and bladder function.  Having significantly more hot flashes, waking her up at night, making it difficult to sleep.  Past Medical/Surgical History: Past Medical History:  Diagnosis Date   Chronic abdominal pain    Chronic foot pain    Cyst    Hearing loss    Hypertension    Ischemic bowel disease (HCC)    Joint pain    Mononeuritis multiplex    Nasal bleeding    Rash    Unspecified hereditary and idiopathic peripheral neuropathy    Wegener's granulomatosis    Wegener's syndrome     Past Surgical History:  Procedure Laterality Date   INJECTION, FOR SENTINEL LYMPH NODE IDENTIFICATION N/A 06/26/2023   Procedure: INJECTION, FOR SENTINEL LYMPH NODE IDENTIFICATION;  Surgeon: [name], MD;  Location: [facility];  Service: Gynecology;  Laterality: N/A;   LYMPH NODE BIOPSY N/A 06/26/2023   Procedure: LYMPH NODE BIOPSY;  Surgeon: [name], MD;  Location: [facility];  Service: Gynecology;  Laterality:  N/A;   ROBOTIC ASSISTED TOTAL HYSTERECTOMY WITH BILATERAL SALPINGO OOPHERECTOMY N/A 06/26/2023   Procedure: HYSTERECTOMY, TOTAL, ROBOT-ASSISTED, LAPAROSCOPIC, WITH BILATERAL SALPINGO-OOPHORECTOMY;  Surgeon: [name], MD;  Location: [facility];  Service: Gynecology;  Laterality: N/A;   TUBAL LIGATION  1992    Family History  Problem Relation Age of Onset   Diabetes Mother    Hypertension Mother    Breast cancer Mother    Diabetes Sister    Hypertension Sister    Lung cancer Paternal Aunt    Colon cancer Neg Hx    Prostate cancer Neg Hx    Ovarian cancer Neg Hx    Endometrial cancer Neg Hx    Pancreatic cancer Neg Hx     Social History   Socioeconomic History   Marital status: Single    Spouse name: Not on file   Number of children: Not on file   Years of education: Not on file   Highest education level: Not on file  Occupational History   Not on file  Tobacco Use   Smoking status: Former    Current packs/day: 0.00    Types: Cigarettes    Quit date: 05/06/2007    Years since quitting: 16.2    Passive exposure: Never   Smokeless tobacco: Never  Vaping Use   Vaping status: Never Used  Substance and Sexual Activity   Alcohol use: No   Drug use: No   Sexual activity: Not Currently    Birth control/protection: Post-menopausal    Comment: intercourse age 33, more than 5 sexual partners,des neg  Other Topics Concern   Not on  file  Social History Narrative   Not on file   Social Drivers of Health   Financial Resource Strain: Low Risk  (02/27/2022)   Received from [facility]   Overall Financial Resource Strain (CARDIA)    Difficulty of Paying Living Expenses: Not hard at all  Food Insecurity: Patient Declined (06/26/2023)   Hunger Vital Sign    Worried About Running Out of Food in the Last Year: Patient declined    Ran Out of Food in the Last Year: Patient declined  Transportation Needs: No Transportation Needs (06/26/2023)   PRAPARE - Scientist, research (physical sciences) (Medical): No    Lack of Transportation (Non-Medical): No  Physical Activity: Sufficiently Active (02/27/2022)   Received from [facility]   Exercise Vital Sign    Days of Exercise per Week: 4 days    Minutes of Exercise per Session: 60 min  Stress: No Stress Concern Present (02/27/2022)   Received from [facility] - Occupational Stress Questionnaire    Feeling of Stress : Not at all  Social Connections: Socially Integrated (02/27/2022)   Received from [facility]   Social Network    How would you rate your social network (family, work, friends)?: Good participation with social networks    Current Medications:  Current Outpatient Medications:    amLODipine  (NORVASC ) 10 MG tablet, Take 10 mg by mouth daily., Disp: , Rfl:    azaTHIOprine  (IMURAN ) 50 MG tablet, Take 2 tablets (100 mg total) by mouth 2 (two) times daily. (Patient taking differently: Take 50 mg by mouth 2 (two) times daily.), Disp: 120 tablet, Rfl: 0   olmesartan (BENICAR) 20 MG tablet, Take 20 mg by mouth daily., Disp: , Rfl:    senna-docusate (SENOKOT-S) 8.6-50 MG tablet, Take 2 tablets by mouth at bedtime. For AFTER surgery, do not take if having diarrhea, Disp: 30 tablet, Rfl: 1   tapentadol  HCl (NUCYNTA ) 75 MG tablet, Take 75 mg by mouth 3 (three) times daily as needed for moderate pain (pain score 4-6) (pain)., Disp: , Rfl:    fentaNYL  (DURAGESIC ) 25 MCG/HR, 1 patch every 3 (three) days., Disp: , Rfl:   Review of Systems: + hearing loss Denies appetite changes, fevers, chills, fatigue, unexplained weight changes. Denies neck lumps or masses, mouth sores, ringing in ears or voice changes. Denies cough or wheezing.  Denies shortness of breath. Denies chest pain or palpitations. Denies leg swelling. Denies abdominal distention, pain, blood in stools, constipation, diarrhea, nausea, vomiting, or early satiety. Denies pain with intercourse, dysuria, frequency,  hematuria or incontinence. Denies hot flashes, pelvic pain, vaginal bleeding or vaginal discharge.   Denies joint pain, back pain or muscle pain/cramps. Denies itching, rash, or wounds. Denies dizziness, headaches, numbness or seizures. Denies swollen lymph nodes or glands, denies easy bruising or bleeding. Denies anxiety, depression, confusion, or decreased concentration.  Physical Exam: BP 137/70 (BP Location: Left Arm)   Pulse 76   Temp 98.4 F (36.9 C) (Oral)   Resp 17   Ht 5\' 1"  (1.549 m)   Wt 178 lb 12.8 oz (81.1 kg)   SpO2 100%   BMI 33.78 kg/m  General: Alert, oriented, no acute distress. HEENT: Posterior oropharynx clear, sclera anicteric. Chest: Unlabored breathing on room air. Abdomen: Obese, soft, nontender.  Normoactive bowel sounds.  No masses or hepatosplenomegaly appreciated.  Well-healed incisions. Extremities: Grossly normal range of motion.  Warm, well perfused.  No edema bilaterally. GU: Normal appearing external  genitalia without erythema, excoriation, or lesions.  Speculum exam reveals cuff intact, suture still visible.  Bimanual exam reveals intact, no fluctuance or tenderness to palpation.  Laboratory & Radiologic Studies: None new  Assessment & Plan: Laura [name] is a 58 y.o. 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   Doing well. Meeting postop milestones. Discussed continued expectations and restrictions.  Pathology reviewed with her from surgery.  She was given a copy of her pathology report.  Given no residual EIN on biopsy, we discussed plan for her to have yearly follow-up with her OB/GYN.  Given bothersome and symptomatic hot flashes, discussed having the patient restart her estrogen patch.  Reviewed that she no longer needs oral progesterone  for endometrial protection.  22 minutes of total time was spent for this patient encounter, including  preparation, face-to-face counseling with the patient and coordination of care, and documentation of the encounter.  Wiley Hanger, MD  Division of Gynecologic Oncology  Department of Obstetrics and Gynecology  [facility]

## 2023-07-28 ENCOUNTER — Encounter: Payer: Self-pay | Admitting: Nurse Practitioner

## 2023-07-28 NOTE — Telephone Encounter (Signed)
 Last AEX 03/13/23 HRT stopped 05/12/23, endometrial cancer.   06/26/23 -Total robot hysterectomy GYN ONC Pharmacy updated.

## 2023-08-08 ENCOUNTER — Encounter: Payer: Self-pay | Admitting: Nurse Practitioner

## 2023-08-11 ENCOUNTER — Other Ambulatory Visit: Payer: Self-pay | Admitting: Nurse Practitioner

## 2023-08-11 DIAGNOSIS — Z79890 Hormone replacement therapy (postmenopausal): Secondary | ICD-10-CM

## 2023-08-11 MED ORDER — ESTRADIOL 0.05 MG/24HR TD PTTW: 1.0000 | MEDICATED_PATCH | TRANSDERMAL | 1 refills | Status: DC

## 2024-01-05 ENCOUNTER — Other Ambulatory Visit: Payer: Self-pay | Admitting: Medical Genetics

## 2024-01-14 ENCOUNTER — Other Ambulatory Visit: Payer: Self-pay | Admitting: Nurse Practitioner

## 2024-01-14 DIAGNOSIS — Z79890 Hormone replacement therapy (postmenopausal): Secondary | ICD-10-CM

## 2024-01-14 NOTE — Telephone Encounter (Signed)
 Med refill request:    estradiol  (VIVELLE -DOT) 0.05 MG/24HR patch  Start:  08/11/23 Disp:   24 patches Refills:  1  Last AEX:  03/13/23 Next AEX:  03/15/24 Last MMG (if hormonal med):  10/02/16 Refill authorized? Please Advise.

## 2024-03-15 ENCOUNTER — Ambulatory Visit: Payer: BC Managed Care – PPO | Admitting: Nurse Practitioner

## 2024-04-02 ENCOUNTER — Ambulatory Visit: Admitting: Nurse Practitioner

## 2024-04-02 NOTE — Progress Notes (Unsigned)
 "  Laura [name] 01-06-1966 [id number]   History:  59 y.o. G2P0002 presents for annual exam. 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 On ERT. Normal pap history. HTN managed by PCP. 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   Gynecologic History No LMP recorded. Patient is postmenopausal.   Contraception/Family planning: post menopausal status Sexually active: No  Health Maintenance Last Pap: 11/19/2021. Results were: Normal neg HPV Last mammogram: 11/20/2022. Results were: Normal Last colonoscopy: 2022. Results were: Normal per patient Last Dexa: Not indicated     03/13/2023    2:38 PM  Depression screen PHQ 2/9  Decreased Interest 3  Down, Depressed, Hopeless 0  PHQ - 2 Score 3  Altered sleeping 3  Tired, decreased energy 2  Change in appetite 2  Feeling bad or failure about yourself  0  Trouble concentrating 0  Moving slowly or fidgety/restless 0  Suicidal thoughts 0  PHQ-9 Score 10      Data saved with a previous flowsheet row definition     Past medical history, past surgical history, family history and social history were all reviewed and documented in the EPIC chart. 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 Son. Daughter - has 10 yo daughter. Mother with history of breast cancer.   ROS:  A ROS was performed and pertinent positives and negatives are included.  Exam:  There were no vitals filed for this visit.    There is no height or weight on file to calculate BMI.  General appearance:  Normal Thyroid:  Symmetrical, normal in size, without palpable masses or nodularity. Respiratory  Auscultation:  Clear without wheezing or rhonchi Cardiovascular  Auscultation:  Regular rate, without rubs, murmurs or gallops  Edema/varicosities:  Not grossly evident Abdominal  Soft,nontender, without masses, guarding or rebound.  Liver/spleen:  No organomegaly noted  Hernia:  None appreciated  Skin  Inspection:  Grossly  normal Breasts: Examined lying and sitting.   Right: Without masses, retractions, nipple discharge or axillary adenopathy.   Left: Without masses, retractions, nipple discharge or axillary adenopathy. Pelvic: External genitalia:  no lesions              Urethra:  normal appearing urethra with no masses, tenderness or lesions              Bartholins and Skenes: normal                 Vagina: normal appearing vagina with normal color and discharge, no lesions. Small amount of blood present              Cervix: no lesions Bimanual Exam:  Uterus:  no masses or tenderness              Adnexa: no mass, fullness, tenderness              Rectovaginal: Deferred              Anus:  normal, no lesions  Patient informed chaperone available to be present for breast and pelvic exam. Patient has requested no chaperone to be present. Patient has been advised what will be completed during breast and pelvic exam.   Assessment/Plan:  59 y.o. G2P0002 for annual exam.   Well female exam with routine gynecological exam - Education provided on SBEs, importance of preventative screenings, current guidelines, high calcium diet, regular exercise, and multivitamin daily.  Labs with PCP.  Screening for cervical cancer - Normal Pap history.  Will repeat at 5-year interval per guidelines.   Screening for breast cancer - Normal mammogram history. Continue annual screenings. Normal breast exam today.  Screening for colon cancer - 2022 colonoscopy. Will repeat at GI's recommended interval.   No follow-ups on file.    [name] DNP, 8:07 AM 04/02/2024 "

## 2024-06-14 ENCOUNTER — Ambulatory Visit: Admitting: Nurse Practitioner
